# Patient Record
Sex: Female | Born: 1975
Health system: Southern US, Community
[De-identification: ages and names within clinical notes are randomized; demographics above are authoritative.]

## PROBLEM LIST (undated history)

## (undated) ENCOUNTER — Inpatient Hospital Stay (HOSPITAL_COMMUNITY): Payer: Self-pay

## (undated) DIAGNOSIS — E119 Type 2 diabetes mellitus without complications: Secondary | ICD-10-CM

## (undated) DIAGNOSIS — D219 Benign neoplasm of connective and other soft tissue, unspecified: Secondary | ICD-10-CM

## (undated) DIAGNOSIS — D649 Anemia, unspecified: Secondary | ICD-10-CM

## (undated) HISTORY — PX: ABDOMINAL HYSTERECTOMY: SHX81

## (undated) HISTORY — PX: OVARIAN CYST REMOVAL: SHX89

## (undated) HISTORY — PX: OTHER SURGICAL HISTORY: SHX169

---

## 2000-09-07 ENCOUNTER — Inpatient Hospital Stay (HOSPITAL_COMMUNITY): Admission: AD | Admit: 2000-09-07 | Discharge: 2000-09-07 | Payer: Self-pay | Admitting: Obstetrics

## 2000-09-09 ENCOUNTER — Inpatient Hospital Stay (HOSPITAL_COMMUNITY): Admission: AD | Admit: 2000-09-09 | Discharge: 2000-09-09 | Payer: Self-pay | Admitting: Obstetrics

## 2000-09-16 ENCOUNTER — Inpatient Hospital Stay (HOSPITAL_COMMUNITY): Admission: AD | Admit: 2000-09-16 | Discharge: 2000-09-16 | Payer: Self-pay | Admitting: Obstetrics

## 2000-09-23 ENCOUNTER — Inpatient Hospital Stay (HOSPITAL_COMMUNITY): Admission: AD | Admit: 2000-09-23 | Discharge: 2000-09-23 | Payer: Self-pay | Admitting: Obstetrics

## 2001-02-13 ENCOUNTER — Inpatient Hospital Stay (HOSPITAL_COMMUNITY): Admission: AD | Admit: 2001-02-13 | Discharge: 2001-02-13 | Payer: Self-pay | Admitting: Obstetrics

## 2002-07-19 ENCOUNTER — Ambulatory Visit (HOSPITAL_COMMUNITY): Admission: RE | Admit: 2002-07-19 | Discharge: 2002-07-19 | Payer: Self-pay | Admitting: *Deleted

## 2002-08-19 ENCOUNTER — Ambulatory Visit (HOSPITAL_COMMUNITY): Admission: RE | Admit: 2002-08-19 | Discharge: 2002-08-19 | Payer: Self-pay | Admitting: *Deleted

## 2003-01-10 ENCOUNTER — Inpatient Hospital Stay (HOSPITAL_COMMUNITY): Admission: AD | Admit: 2003-01-10 | Discharge: 2003-01-12 | Payer: Self-pay | Admitting: *Deleted

## 2005-02-05 ENCOUNTER — Ambulatory Visit (HOSPITAL_COMMUNITY): Admission: RE | Admit: 2005-02-05 | Discharge: 2005-02-05 | Payer: Self-pay | Admitting: *Deleted

## 2005-03-12 ENCOUNTER — Ambulatory Visit (HOSPITAL_COMMUNITY): Admission: RE | Admit: 2005-03-12 | Discharge: 2005-03-12 | Payer: Self-pay | Admitting: *Deleted

## 2005-05-26 ENCOUNTER — Ambulatory Visit: Payer: Self-pay | Admitting: Obstetrics & Gynecology

## 2005-05-31 ENCOUNTER — Inpatient Hospital Stay (HOSPITAL_COMMUNITY): Admission: AD | Admit: 2005-05-31 | Discharge: 2005-06-02 | Payer: Self-pay | Admitting: *Deleted

## 2005-05-31 ENCOUNTER — Ambulatory Visit: Payer: Self-pay | Admitting: Family Medicine

## 2006-01-01 ENCOUNTER — Ambulatory Visit (HOSPITAL_COMMUNITY): Admission: RE | Admit: 2006-01-01 | Discharge: 2006-01-01 | Payer: Self-pay | Admitting: *Deleted

## 2006-09-08 ENCOUNTER — Ambulatory Visit: Payer: Self-pay | Admitting: Family Medicine

## 2006-12-21 ENCOUNTER — Ambulatory Visit (HOSPITAL_COMMUNITY): Admission: RE | Admit: 2006-12-21 | Discharge: 2006-12-21 | Payer: Self-pay | Admitting: Obstetrics & Gynecology

## 2007-01-28 ENCOUNTER — Ambulatory Visit: Payer: Self-pay | Admitting: Family Medicine

## 2007-02-03 ENCOUNTER — Ambulatory Visit: Payer: Self-pay | Admitting: *Deleted

## 2007-02-18 ENCOUNTER — Ambulatory Visit: Payer: Self-pay | Admitting: Family Medicine

## 2007-02-20 IMAGING — US US OB COMP +14 WK
1 series · 17 of 28 positions shown · non-contrast
Comparison: none

CLINICAL DATA: Anatomic exam.  24 weeks 6 days estimated gestational age measuring 28 weeks.

[Series 1: us ob comp +14 wk · 17 of 105 slices shown]
[im 1/105]
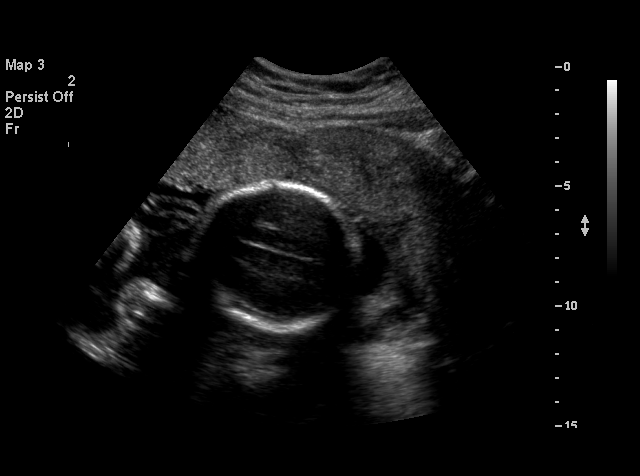
[im 8/105]
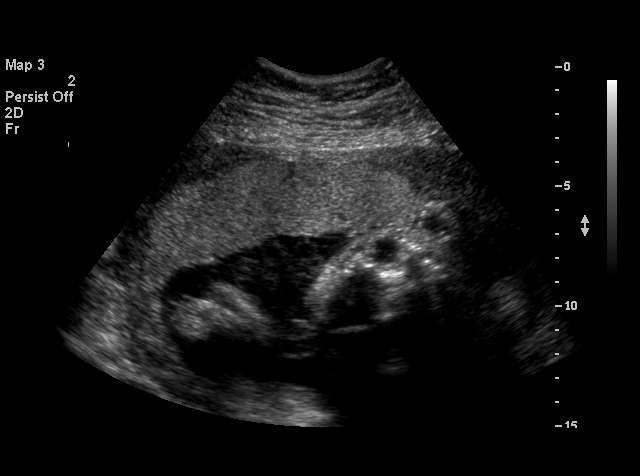
[im 16/105]
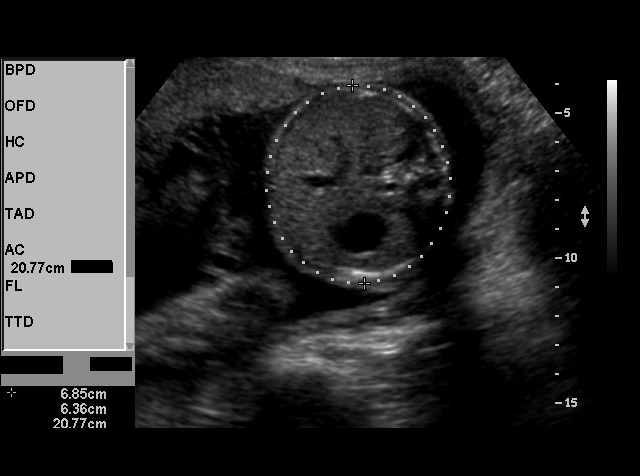
[im 20/105]
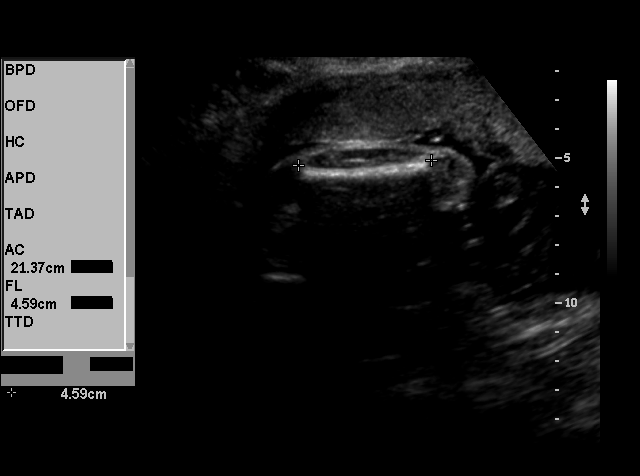
[im 27/105]
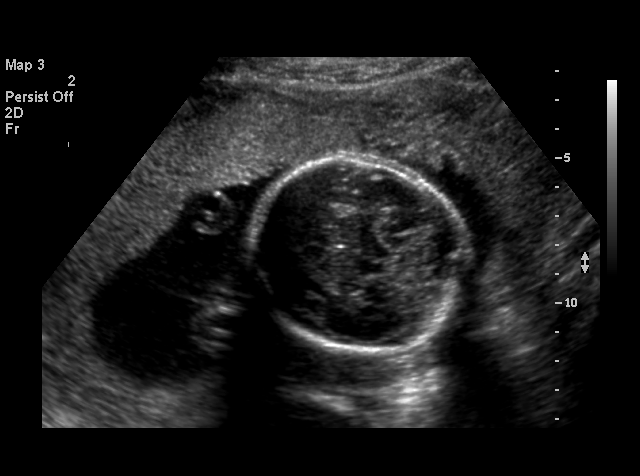
[im 35/105]
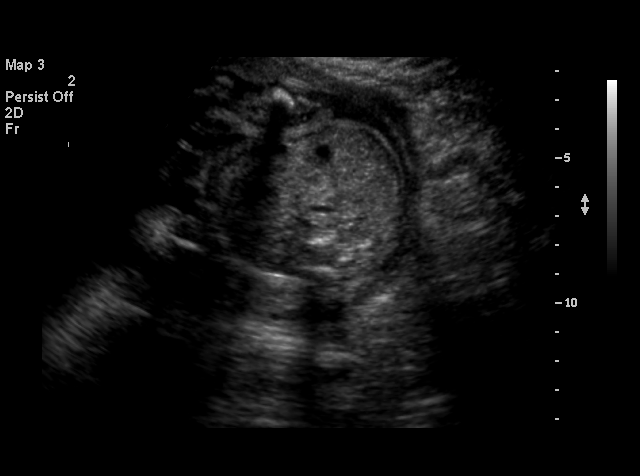
[im 39/105]
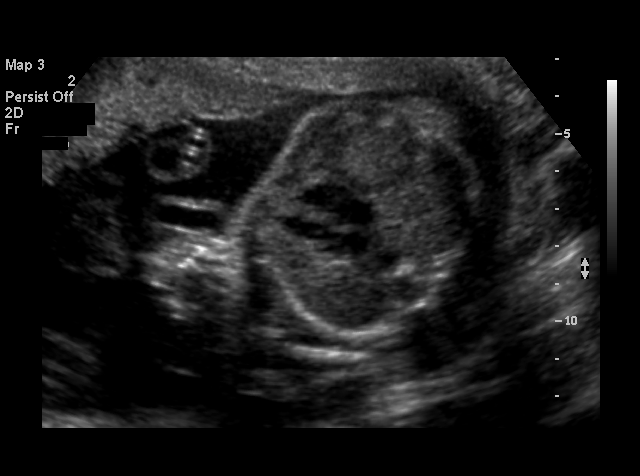
[im 47/105]
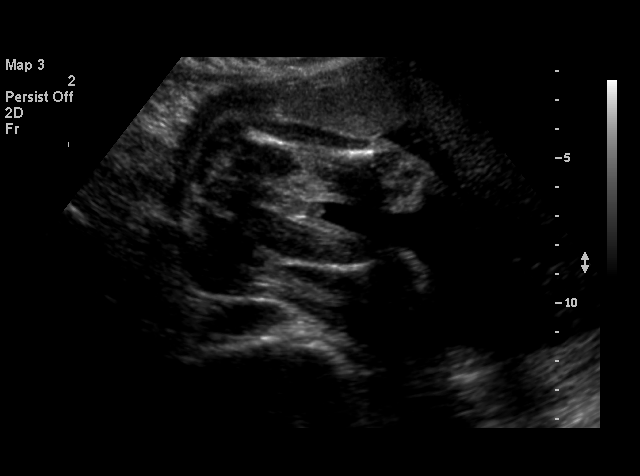
[im 54/105]
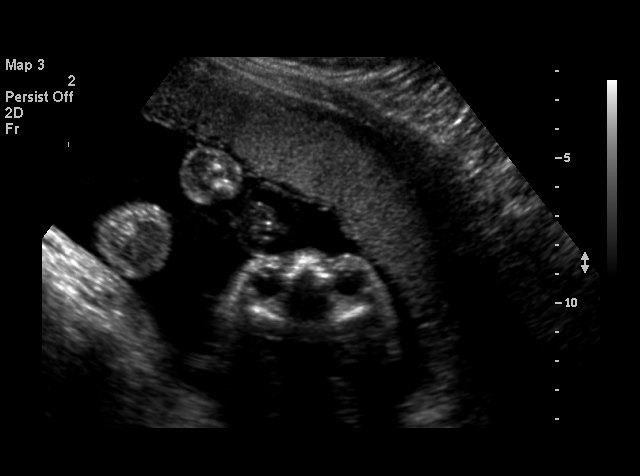
[im 58/105]
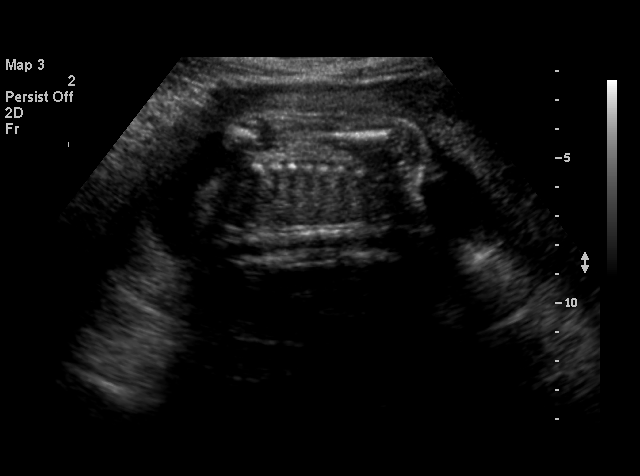
[im 66/105]
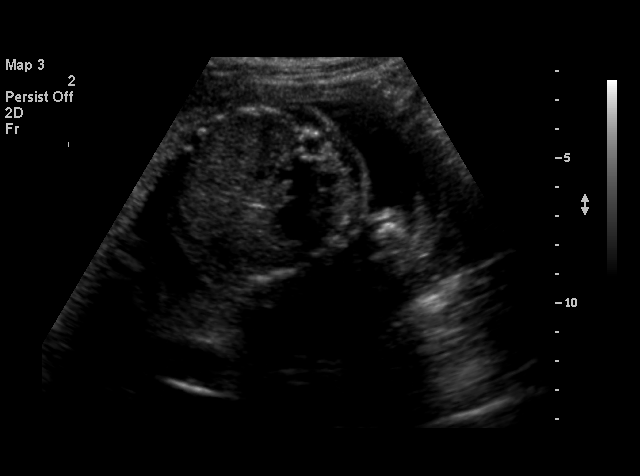
[im 70/105]
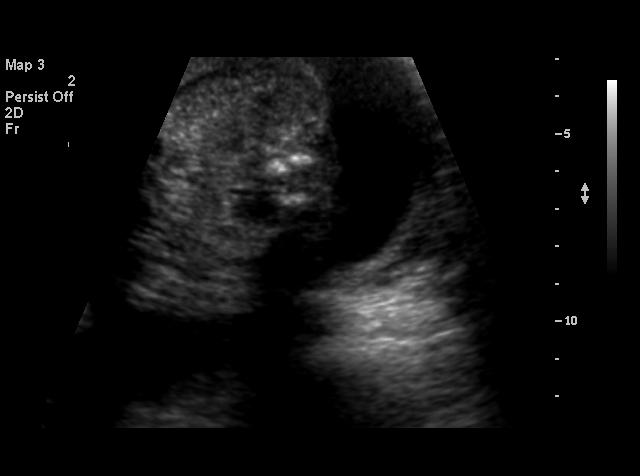
[im 78/105]
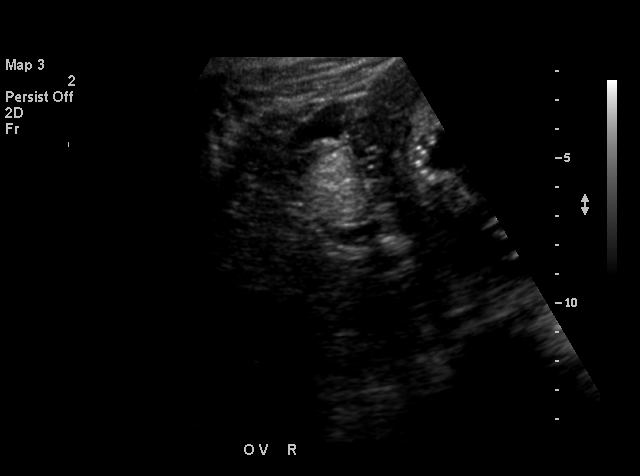
[im 85/105]
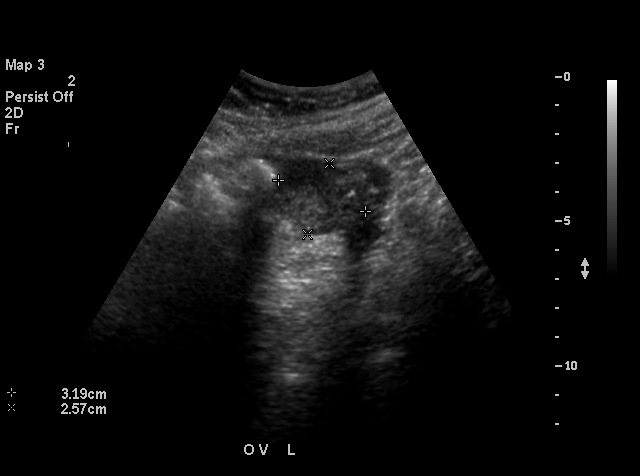
[im 89/105]
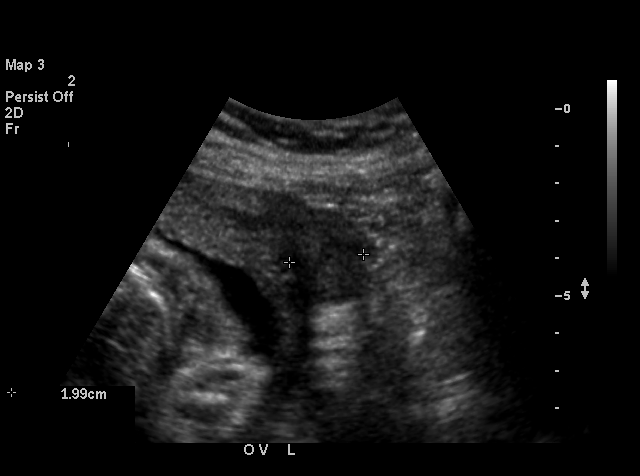
[im 97/105]
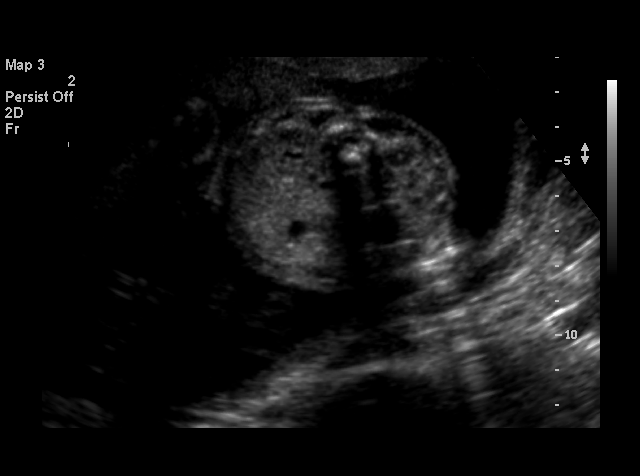
[im 105/105]
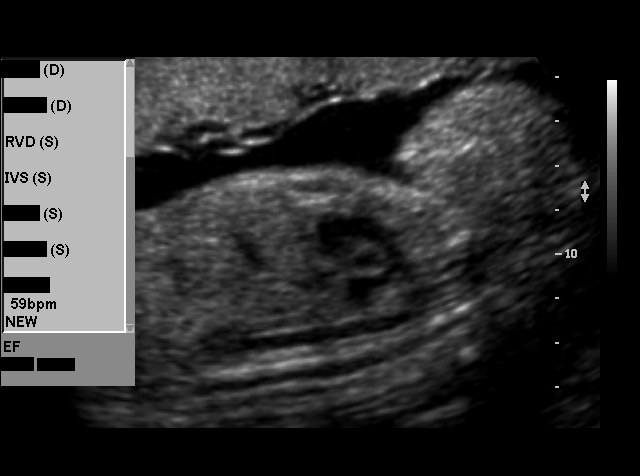

[17 of 28 positions shown; findings below may reference images not displayed]

OBSTETRICAL ULTRASOUND:
Number of Fetuses:  1
Heart Rate:  133
Movement:  Yes
Breathing:  No  
Presentation:  Cephalic
Placental Location:  Anterior
Grade:  I
Previa:  No
Amniotic Fluid (Subjective):  Normal
Amniotic Fluid (Objective):   5.8 cm Vertical pocket 

FETAL BIOMETRY
BPD:  6.2 cm   25 w 3 d
HC:  22.5 cm  24 w 5 d
AC:  21.3 cm   25 w 6 d
FL:  4.5 cm   25 w 1 d

MEAN GA:  25 w 2 d

FETAL ANATOMY
Lateral Ventricles:    Visualized 
Thalami/CSP:      Visualized 
Posterior Fossa:  Visualized 
Nuchal Region:    N/A
Spine:      Visualized 
4 Chamber Heart on Left:      Visualized 
Stomach on Left:      Visualized 
3 Vessel Cord:    Visualized 
Cord Insertion site:    Visualized 
Kidneys:  Visualized 
Bladder:  Visualized 
Extremities:      Visualized 

ADDITIONAL ANATOMY VISUALIZED:  LVOT, RVOT, upper lip, orbits, profile, diaphragm, heel, ductal arch, and male genitalia.

MATERNAL UTERINE AND ADNEXAL FINDINGS
Cervix:   4.2 cm Transabdominally
The right ovary contains an echogenic complex focus with a central cystic component measuring 1.9 x 3.6 x 2.7 cm and most compatible with a dermoid.  The left ovary is within normal limits.
IMPRESSION: 1.  Single intrauterine pregnancy demonstrating an estimated gestational age by ultrasound of 25 weeks and 2 days.  Correlation with assigned gestational age of 24 weeks and 6 days suggests appropriate growth.
2.  No focal fetal or placental abnormalities are noted with a good anatomic exam possible.  The aortic arch and 5th digit could not be seen due to positioning.  
3.  Noted is the presence of an echogenic complex cystic focus within the right ovary with measurements as above.  The appearance is most compatible with a dermoid tumor. This can be followed-up at a later point in gestation to assess for stability.
4.  Subjectively and quantitatively normal amniotic fluid volume and normal cervical length

</u12:p>

## 2007-06-23 ENCOUNTER — Encounter (INDEPENDENT_AMBULATORY_CARE_PROVIDER_SITE_OTHER): Payer: Self-pay | Admitting: *Deleted

## 2007-08-16 DIAGNOSIS — R05 Cough: Secondary | ICD-10-CM

## 2007-08-16 DIAGNOSIS — D179 Benign lipomatous neoplasm, unspecified: Secondary | ICD-10-CM | POA: Insufficient documentation

## 2007-08-16 DIAGNOSIS — G4700 Insomnia, unspecified: Secondary | ICD-10-CM

## 2007-08-16 DIAGNOSIS — R059 Cough, unspecified: Secondary | ICD-10-CM | POA: Insufficient documentation

## 2007-11-17 ENCOUNTER — Ambulatory Visit: Payer: Self-pay | Admitting: Family Medicine

## 2007-11-17 ENCOUNTER — Encounter (INDEPENDENT_AMBULATORY_CARE_PROVIDER_SITE_OTHER): Payer: Self-pay | Admitting: Internal Medicine

## 2007-11-17 LAB — CONVERTED CEMR LAB
AST: 24 units/L (ref 0–37)
Alkaline Phosphatase: 94 units/L (ref 39–117)
Basophils Absolute: 0 10*3/uL (ref 0.0–0.1)
Basophils Relative: 0 % (ref 0–1)
Cholesterol: 194 mg/dL (ref 0–200)
Eosinophils Absolute: 0.3 10*3/uL (ref 0.0–0.7)
Eosinophils Relative: 3 % (ref 0–5)
Glucose, Bld: 78 mg/dL (ref 70–99)
Hemoglobin: 12.7 g/dL (ref 12.0–15.0)
LDL Cholesterol: 81 mg/dL (ref 0–99)
Lymphocytes Relative: 35 % (ref 12–46)
MCHC: 31.8 g/dL (ref 30.0–36.0)
Monocytes Relative: 6 % (ref 3–12)
Platelets: 290 10*3/uL (ref 150–400)
RDW: 14.3 % (ref 11.5–15.5)
Total CHOL/HDL Ratio: 4.7
Triglycerides: 359 mg/dL — ABNORMAL HIGH (ref <150)

## 2007-11-24 ENCOUNTER — Ambulatory Visit (HOSPITAL_COMMUNITY): Admission: RE | Admit: 2007-11-24 | Discharge: 2007-11-24 | Payer: Self-pay | Admitting: Family Medicine

## 2007-12-01 ENCOUNTER — Ambulatory Visit: Payer: Self-pay | Admitting: Internal Medicine

## 2007-12-01 LAB — CONVERTED CEMR LAB
HDL: 48 mg/dL (ref >39)
Total CHOL/HDL Ratio: 3.4
Triglycerides: 158 mg/dL — ABNORMAL HIGH (ref <150)
VLDL: 32 mg/dL (ref 0–40)

## 2007-12-28 ENCOUNTER — Ambulatory Visit: Payer: Self-pay | Admitting: Internal Medicine

## 2008-02-10 ENCOUNTER — Ambulatory Visit: Payer: Self-pay | Admitting: Obstetrics and Gynecology

## 2008-02-21 ENCOUNTER — Ambulatory Visit: Payer: Self-pay | Admitting: Internal Medicine

## 2008-02-21 LAB — CONVERTED CEMR LAB: Lipase: 21 units/L (ref 0–75)

## 2008-08-01 ENCOUNTER — Encounter (INDEPENDENT_AMBULATORY_CARE_PROVIDER_SITE_OTHER): Payer: Self-pay | Admitting: Internal Medicine

## 2008-08-01 ENCOUNTER — Ambulatory Visit: Payer: Self-pay | Admitting: Internal Medicine

## 2008-08-01 LAB — CONVERTED CEMR LAB
Amphetamine Screen, Ur: NEGATIVE
Barbiturate Quant, Ur: NEGATIVE
Basophils Absolute: 0 10*3/uL (ref 0.0–0.1)
Benzodiazepines.: NEGATIVE
Cocaine Metabolites: NEGATIVE
Creatinine,U: 131.5 mg/dL
Eosinophils Absolute: 0.3 10*3/uL (ref 0.0–0.7)
Marijuana Metabolite: NEGATIVE
Monocytes Absolute: 0.6 10*3/uL (ref 0.1–1.0)
Neutro Abs: 5.3 10*3/uL (ref 1.7–7.7)
RBC: 4.69 M/uL (ref 3.87–5.11)
WBC: 8.4 10*3/uL (ref 4.0–10.5)

## 2008-08-02 ENCOUNTER — Encounter: Admission: RE | Admit: 2008-08-02 | Discharge: 2008-08-02 | Payer: Self-pay | Admitting: Internal Medicine

## 2008-08-10 ENCOUNTER — Ambulatory Visit (HOSPITAL_COMMUNITY): Admission: RE | Admit: 2008-08-10 | Discharge: 2008-08-10 | Payer: Self-pay | Admitting: Obstetrics and Gynecology

## 2008-08-17 ENCOUNTER — Ambulatory Visit: Payer: Self-pay | Admitting: Obstetrics and Gynecology

## 2008-08-22 ENCOUNTER — Ambulatory Visit (HOSPITAL_COMMUNITY): Admission: RE | Admit: 2008-08-22 | Discharge: 2008-08-22 | Payer: Self-pay | Admitting: Obstetrics & Gynecology

## 2008-09-15 ENCOUNTER — Ambulatory Visit: Payer: Self-pay | Admitting: Obstetrics & Gynecology

## 2008-11-07 ENCOUNTER — Ambulatory Visit: Payer: Self-pay | Admitting: Obstetrics & Gynecology

## 2008-11-07 ENCOUNTER — Inpatient Hospital Stay (HOSPITAL_COMMUNITY): Admission: RE | Admit: 2008-11-07 | Discharge: 2008-11-08 | Payer: Self-pay | Admitting: Obstetrics & Gynecology

## 2008-11-07 ENCOUNTER — Encounter: Payer: Self-pay | Admitting: Obstetrics & Gynecology

## 2008-11-24 ENCOUNTER — Encounter: Payer: Self-pay | Admitting: Obstetrics & Gynecology

## 2008-11-24 ENCOUNTER — Ambulatory Visit: Payer: Self-pay | Admitting: Obstetrics & Gynecology

## 2008-11-24 LAB — CONVERTED CEMR LAB
HCV Ab: NEGATIVE
Hepatitis B Surface Ag: NEGATIVE

## 2009-01-16 ENCOUNTER — Inpatient Hospital Stay (HOSPITAL_COMMUNITY): Admission: AD | Admit: 2009-01-16 | Discharge: 2009-01-16 | Payer: Self-pay | Admitting: Obstetrics & Gynecology

## 2009-01-17 ENCOUNTER — Ambulatory Visit: Payer: Self-pay | Admitting: Obstetrics and Gynecology

## 2009-01-17 LAB — CONVERTED CEMR LAB
Platelets: 281 10*3/uL (ref 150–400)
RBC: 3.77 M/uL — ABNORMAL LOW (ref 3.87–5.11)

## 2009-01-18 ENCOUNTER — Encounter: Payer: Self-pay | Admitting: Obstetrics & Gynecology

## 2009-01-18 ENCOUNTER — Ambulatory Visit (HOSPITAL_COMMUNITY): Admission: RE | Admit: 2009-01-18 | Discharge: 2009-01-18 | Payer: Self-pay | Admitting: Obstetrics & Gynecology

## 2009-01-18 ENCOUNTER — Ambulatory Visit: Payer: Self-pay | Admitting: Obstetrics & Gynecology

## 2009-02-07 ENCOUNTER — Ambulatory Visit: Payer: Self-pay | Admitting: Obstetrics and Gynecology

## 2009-03-20 ENCOUNTER — Ambulatory Visit: Payer: Self-pay | Admitting: Internal Medicine

## 2009-08-07 ENCOUNTER — Ambulatory Visit: Payer: Self-pay | Admitting: Internal Medicine

## 2009-08-08 ENCOUNTER — Encounter (INDEPENDENT_AMBULATORY_CARE_PROVIDER_SITE_OTHER): Payer: Self-pay | Admitting: Adult Health

## 2009-12-14 ENCOUNTER — Ambulatory Visit (HOSPITAL_COMMUNITY): Admission: RE | Admit: 2009-12-14 | Discharge: 2009-12-14 | Payer: Self-pay | Admitting: Family Medicine

## 2009-12-18 ENCOUNTER — Encounter: Payer: Self-pay | Admitting: Family Medicine

## 2009-12-18 ENCOUNTER — Ambulatory Visit (HOSPITAL_COMMUNITY): Admission: RE | Admit: 2009-12-18 | Discharge: 2009-12-18 | Payer: Self-pay | Admitting: Family Medicine

## 2010-01-02 ENCOUNTER — Ambulatory Visit (HOSPITAL_COMMUNITY): Admission: RE | Admit: 2010-01-02 | Discharge: 2010-01-02 | Payer: Self-pay | Admitting: Family Medicine

## 2010-01-15 ENCOUNTER — Ambulatory Visit (HOSPITAL_COMMUNITY): Admission: RE | Admit: 2010-01-15 | Discharge: 2010-01-15 | Payer: Self-pay | Admitting: Family Medicine

## 2010-01-15 ENCOUNTER — Encounter: Payer: Self-pay | Admitting: Family Medicine

## 2010-04-06 ENCOUNTER — Ambulatory Visit: Payer: Self-pay | Admitting: Family Medicine

## 2010-04-06 ENCOUNTER — Inpatient Hospital Stay (HOSPITAL_COMMUNITY): Admission: AD | Admit: 2010-04-06 | Discharge: 2010-04-07 | Payer: Self-pay | Admitting: Obstetrics & Gynecology

## 2010-04-21 ENCOUNTER — Inpatient Hospital Stay (HOSPITAL_COMMUNITY): Admission: AD | Admit: 2010-04-21 | Discharge: 2010-04-21 | Payer: Self-pay | Admitting: Obstetrics and Gynecology

## 2010-12-22 LAB — RPR: RPR Ser Ql: NONREACTIVE

## 2010-12-22 LAB — CBC
HCT: 39 % (ref 36.0–46.0)
Hemoglobin: 13.2 g/dL (ref 12.0–15.0)
MCH: 30.1 pg (ref 26.0–34.0)
MCHC: 33.9 g/dL (ref 30.0–36.0)
Platelets: 167 10*3/uL (ref 150–400)
RBC: 4.39 MIL/uL (ref 3.87–5.11)
WBC: 7.1 10*3/uL (ref 4.0–10.5)

## 2011-01-15 LAB — CBC
HCT: 35.1 % — ABNORMAL LOW (ref 36.0–46.0)
MCHC: 34.4 g/dL (ref 30.0–36.0)
MCV: 85.7 fL (ref 78.0–100.0)
MCV: 86.1 fL (ref 78.0–100.0)
Platelets: 297 10*3/uL (ref 150–400)
WBC: 6.4 10*3/uL (ref 4.0–10.5)

## 2011-01-15 LAB — SAMPLE TO BLOOD BANK

## 2011-01-21 LAB — CBC
HCT: 33.2 % — ABNORMAL LOW (ref 36.0–46.0)
Hemoglobin: 11.1 g/dL — ABNORMAL LOW (ref 12.0–15.0)
MCHC: 33.3 g/dL (ref 30.0–36.0)
MCHC: 33.3 g/dL (ref 30.0–36.0)
MCV: 85.9 fL (ref 78.0–100.0)
MCV: 87.2 fL (ref 78.0–100.0)
RBC: 4.5 MIL/uL (ref 3.87–5.11)
RDW: 14 % (ref 11.5–15.5)
RDW: 14.1 % (ref 11.5–15.5)

## 2011-02-18 NOTE — Op Note (Signed)
NAME:  Theresa Barry, Theresa Barry     ACCOUNT NO.:  [id number]   MEDICAL RECORD NO.:  [id number]          PATIENT TYPE:  INP   LOCATION:  9315                          [facility]   PHYSICIAN:  Norton Blizzard, MD    DATE OF BIRTH:  Oct 22, 1975   DATE OF PROCEDURE:  DATE OF DISCHARGE:                               OPERATIVE REPORT   PREOPERATIVE DIAGNOSIS:  Right ovarian dermoid cyst.   POSTOPERATIVE DIAGNOSES:  Right ovarian dermoid cyst, left ovarian  simple cyst.   PROCEDURE:  Diagnostic laparoscopy, minilaparotomy, right ovarian  cystectomy, left ovarian cyst aspiration.   SURGEON:  Norton Blizzard, MD   ASSISTANT:  Javier Glazier. Rose, MD   ANESTHESIA:  General.   IV FLUIDS:  2000 mL of lactated Ringer.   URINE OUTPUT:  375 mL.   ESTIMATED BLOOD LOSS:  100 mL.   INDICATIONS:  The patient is a 35 year old, gravida 3, para 2-0-1-2,  with persistent right ovarian dermoid cyst since 2006.  On the last  ultrasound in November 2009, the dermoid measured 5.5 cm.  She had CA-  125 which was normal at 13.6.  Given the persistent enlarging ovarian  dermoid cyst, the patient was counseled regarding the need for surgery  and risks of surgery including bleeding, infection, injury to  surrounding organs, need for additional procedures, possible laparotomy,  possible oophorectomy were discussed with the patient and written  informed consent was obtained using the help of a Spanish interpreter.   FINDINGS:  A 4-cm right ovarian dermoid cyst and a 6-cm left ovarian  simple cyst.  Normal uterus and rest of the pelvis.   SPECIMENS:  Right ovarian dermoid cyst.   DISPOSITION OF SPECIMENS:  To Pathology.   COMPLICATIONS:  None immediate.   PROCEDURE DETAILS:  The patient received preoperative IV antibiotics and  had sequential compression devices applied to her lower extremities  while in the preoperative area.  She was then taken to the operating  room where general anesthesia was  administered and found to be adequate.  The patient was then laid in a dorsal lithotomy position and prepped and  draped in a sterile manner.  Foley catheter was inserted into the  patient's bladder and attached to constant gravity, and an uterine  manipulator was also placed to the patient's cervix at this point.  Attention was turned to the patient's abdomen where an umbilical  incision was made, and the Veress needle was inserted into the patient's  abdomen.  Correct intraperitoneal placement was confirmed with a low  opening pressure of 5 mmHg upon insufflation with carbon dioxide gas.  The abdomen was then insufflated to 15 mmHg and 10-mm trocar was placed  in the umbilicus.  The laparoscope was placed through this trocar which  also confirmed correct intraperitoneal placement.  The pelvis was  surveyed, and at this point it was noted that there was large left  ovarian cyst and there was no way to tell if this was the dermoid cyst.  The right ovary looked significantly smaller than the left ovarian cyst,  and there were no external indications of a dermoid cyst in this ovary.  Given the uncertainty and also that there was not much exposure given  the large ovarian cyst on the left, the decision was made to proceed  with a minilaparotomy.  The abdomen was desufflated, and all instruments  were removed.  A  mini-Pfannenstiel incision was then made with the  scalpe, about 5-6 cm in length.  This incision was taken down to the  layer of fascia using electrocautery.  The fascia was incised in the  midline and this incision was extended bilaterally.  Kochers were  applied to the superior aspect of the fascial incision, and the  underlying rectus muscles were dissected off sharply and bluntly.  A  similar process was carried out on the inferior aspect.  The rectus  muscles were separated in the midline bluntly, and the peritoneum was  entered bluntly.  This peritoneal incision was extended  superiorly and  inferiorly with good visualization of the bowel and bladder.  Attention  was then turned to the pelvis where the left ovary was able to be  encountered and delivered up through the incision.  This ovary was  carefully examined and the large cyst was noted to be a simple cyst.  This left ovarian simple cyst was then aspirated for clear fluid, and  the incision that allowed for the aspiration was reapproximated using a  figure-of-eight stitch of 0 Vicryl.  Overall, good hemostasis was noted.  Attention was then turned to the patient's right ovary that was palpated  and the dermoid cyst was noted to be encompassing much of the middle of  the ovary.  The right ovary was delivered outside the incision on the  abdomen and was surrounded by multiple wet laparotomy sponges in the  event of spillage.  A longitudinal incision on the cortex of the ovary  was made to the outside layer of the cyst. Using hemostats and also  blunt methods, the cyst was carefully extricated from the surrounding  ovarian cortex and was able to be removed intact and no spillage  occurred.  The base of the cyst defect was then coagulated to maintain  hemostasis, and the ovarian cortex and the remaining ovary on the right  side were reapproximated using 2-0 Vicryl in a pursestring fashion  internally to reapproximate the ovary.  A mattress suture was used on  the top of the cortex for further reapproximation and hemostasis.  Overall, good hemostasis was noted.  This ovary was also wrapped with  Surgicel and then returned to the pelvis.  The rest of the pelvis was  surveyed and no abnormalities were seen.  The fascial layer was  reapproximated using 0 Vicryl in a running fashion.  The subcutaneous  layer was copiously irrigated and then reapproximated with 2-0 plain gut  given that it was about 4 cm in depth, and the skin was closed with 3-0  Vicryl subcuticular stitch.  Benzoin and Steri-Strips were also  placed  to reinforce this incision and laparoscopic  umbilical incision was  closed using the 0 Vicryl interrupted stitches on the fascial layer and  Dermabond for the skin incision.  The patient tolerated the procedure  well.  Sponge, instrument, and needle counts were correct x2.  She was  taken to the recovery room awake, extubated, and in stable condition.      Norton Blizzard, MD  Electronically Signed     UAD/MEDQ  D:  11/07/2008  T:  11/08/2008  Job:  [id number]

## 2011-02-18 NOTE — Group Therapy Note (Signed)
NAME:  Theresa Barry, Theresa Barry NO.:  [id number]   MEDICAL RECORD NO.:  [id number]          PATIENT TYPE:  WOC   LOCATION:  [facility]                   FACILITY:  [facility]   PHYSICIAN:  Argentina Donovan, MD        DATE OF BIRTH:  04/19/1976   DATE OF SERVICE:  02/10/2008                                  CLINIC NOTE   The patient is a Spanish-speaking Hispanic female gravida 3, para 2-0-1-  2 who was seen at [facility] with some pelvic pain.  She was sent for  ultrasound, which showed a small echogenic mass measuring 4.9 x 2.6 x  3.0 cm in the right ovary.  She had an  ultrasound in June 2006 which  showed the same mass at that time measuring 3.4 x 2.7 x 2.4 cm, thought  probably to be most likely a dermoid cyst.  The patient is asymptomatic  with this.  There was also seen a small leiomyomata measuring 1 x 0.9 x  1.3 centimeters.  She has intrauterine device, a Mirena, which she has  had for the past 3 years since her last child was delivered, and the  only significant worrisome factor here I think is that her mother had a  history of ovarian cancer.  She also had a brother with prostate cancer.  Since the patient is asymptomatic and masses are small at this point, I  thought we will go ahead and get a CA-125 and repeat the ultrasound  within 6 months.  She seems to agree with this plan and will have her  come back in 6 months for follow-up.  She had a Pap smear in February at  the [facility] and is getting a mammogram in the near future.   IMPRESSION:  Right ovarian cyst slightly increasing in size over the 3-  year period.  Mother has maternal history of breast cancer.   PLAN:  Follow-up in 6 months with another ultrasound.           ______________________________  Argentina Donovan, MD     PR/MEDQ  D:  02/10/2008  T:  02/10/2008  Job:  [id number]

## 2011-02-18 NOTE — Op Note (Signed)
NAME:  Theresa Barry, Theresa Barry     ACCOUNT NO.:  [id number]   MEDICAL RECORD NO.:  [id number]          PATIENT TYPE:  WOC   LOCATION:  WOC                          FACILITY:  [facility]   PHYSICIAN:  Lesly Dukes, M.D. 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   PROCEDURE:  Removal of intrauterine contraceptive device and vaginal  myomectomy.   SURGEON:  Lesly Dukes, MD   ANESTHESIA:  General.   FINDINGS:  Dislodged IUD and aborting myoma that was actively bleeding.   SPECIMENS:  Myoma to Pathology.   ESTIMATED BLOOD LOSS:  Minimal.   COMPLICATIONS:  None.   PROCEDURE:  After informed consent was obtained, the patient was taken  to operating room where general anesthesia was found to be adequate.  The patient was placed in dorsal lithotomy position and prepped and  draped in normal sterile fashion.  The bladder was emptied.  A lid  speculum was placed in the patient's vagina and retractors were placed  along the vaginal walls.  A body myoma was isolated.  Endoloops were  used to help aid in hemostasis.  The myoma was then twisted off its  stalk.  There was no bleeding after the procedure.  One of the Endoloop  did come off with the myoma.  There was also an IUD that was dislodged  and this was removed.  The patient is aware that she will need new birth  control.  The patient tolerated the procedure well.  Sponge, lap,  instrument, and needle count were correct x2 and the patient went to  recovery in stable condition.      Lesly Dukes, M.D.  Electronically Signed     KHL/MEDQ  D:  01/18/2009  T:  01/19/2009  Job:  [id number]

## 2011-02-18 NOTE — Group Therapy Note (Signed)
NAME:  Theresa Barry, Theresa Barry     ACCOUNT NO.:  [id number]   MEDICAL RECORD NO.:  [id number]          PATIENT TYPE:  WOC   LOCATION:  [facility]                   [facility]AC[facility]   PHYSICIAN:  Johnella Moloney, MD        DATE OF BIRTH:  Oct 06, 1976   DATE OF SERVICE:  11/24/2008                                  CLINIC NOTE   CHIEF COMPLAINT:  Followup of a right ovarian dermoid cyst and annual  exam.   HISTORY OF PRESENT ILLNESS:  This patient is a 35 year old gravida 3,  para 2-0-1-2, who is here for followup care of removal of a right  ovarian dermoid cyst as well as annual exam.  Her first imaging for this  cyst was done in June 2006, and the cyst measured 3.5 cm.  An ultrasound  was also done in November 2009, finding the cyst to be larger measuring  5.5 cm.  This cyst was removed as it was increasing in size.  Ms.  Greenfield has no other gynecological complaints at this time.  Interpreter  is used.   PAST MEDICAL HISTORY:  Right ovarian dermoid cyst.   PAST SURGICAL HISTORY:  Removal of right ovarian dermoid cyst on  November 07, 2008.   PAST OBSTETRIC/GYNECOLOGICAL HISTORY:  The patient had menarche at age  25.  She has normal menstrual periods.  No intermenstrual bleeding.  The  patient denies any sexually transmitted infections.  Her Pap smear is  done today.  STD screening was also performed.  Note that her last Pap  smear in February 2009 was normal, and her last mammogram was in October  2009, which was also normal.   OBSTETRICAL HISTORY:  She is a gravida 3, para 2-0-1-2 with 2 children  delivered vaginally.  No obstetric complications.   MEDICATIONS:  Multivitamins p.o. daily.   ALLERGIES:  No known drug, food, or latex allergies.   SOCIAL HISTORY:  This patient lives with her family, and denies any  tobacco, alcohol, or illicit drug use.   FAMILY HISTORY:  Remarkable for prominent history of breast cancer on  maternal and paternal sides.  A brother has  prostate cancer.  Her mother  has a history of ovarian cancer.  The patient had a CA-125 done, which  came back within normal limits at a level of 13.6.   PHYSICAL EXAMINATION:  VITAL SIGNS:  Temperature 98.0, pulse 79, blood  pressure 88/63, weight 173.6 pounds, height 5 feet 2 inches, and  respirations 20.  GENERAL:  This is a pleasant woman in no apparent distress.  LUNGS:  Clear to auscultation bilaterally.  HEART:  Regular rate and rhythm.  Good S1 and S2.  ABDOMEN: Soft, nontender, and nondistended.  The incision site is  healing well.  It is dry, intact with no sign of infection, no erythema.  Steri-Strips were removed today.  EXTREMITIES:  No cyanosis, clubbing, or edema.  BREASTS:  Symmetric and normal in size.  Smooth with normal breast  tissue.  No masses or lymphadenopathy.  No discharge.  PELVIC:  External genitalia normal.  Vaginal mucosa moist without  lesion.  Normal rugation.  Cervix, parous.  No lesions.  Pap  smear done  today.   ASSESSMENT AND PLAN:  The patient is a 35 year old gravida 3, para 2-0-1-  2, here for her postoperative examination and annual exam.  It was  explained to the patient that the pathology report showed the ovarian  cyst was a dermoid cyst.  The patient has no other complaints at this  time.  A Pap smear was performed with gonorrhea and Chlamydia results  pending.  Further STD testing will be done today.  Serum testing for  hepatitis B, hepatitis C, HIV, and syphilis.  The patient will be  notified with the results.  She will return to clinic in one year for  another annual exam or sooner if needed.     ______________________________  Johnella Moloney, MD    ______________________________  Johnella Moloney, MD    UD/MEDQ  D:  11/24/2008  T:  11/25/2008  Job:  [id number]

## 2011-02-18 NOTE — Group Therapy Note (Signed)
NAME:  Theresa Barry, Theresa Barry     ACCOUNT NO.:  [id number]   MEDICAL RECORD NO.:  [id number]          PATIENT TYPE:  WOC   LOCATION:  [facility]                   FACILITY:  [facility]   PHYSICIAN:  Argentina Donovan, MD        DATE OF BIRTH:  11-Jan-1976   DATE OF SERVICE:  01/17/2009                                  CLINIC NOTE   The patient is a 35 year old Spanish speaking Hispanic female who  underwent diagnostic laparoscopy and mini laparotomy for right ovarian  cystectomy for dermoid cyst in February of this year.  She was having no  problems at her follow-up visit reported on November 25, 2008 and went  into maternity admissions yesterday and said she had been bleeding since  the procedure.  Had an examination and CBC which revealed a hemoglobin  of 11+ and bleeding that continued.  She came to the GYN clinic and was  noted to have a 5 cm aborting fibroid.  I called Dr. Jearld Lesch in to  examine her and she will be doing the surgery and she is going to have  her scheduled for tomorrow.  History and physical has been done and  dictated on the hospital line.  The patient will be admitted for D and C  and vaginal aborting with myomectomy of aborting fibroid.           ______________________________  Argentina Donovan, MD     PR/MEDQ  D:  01/17/2009  T:  01/17/2009  Job:  [id number]

## 2011-02-18 NOTE — Discharge Summary (Signed)
Theresa Barry, Theresa Barry     ACCOUNT NO.:  [id number]   MEDICAL RECORD NO.:  [id number]         PATIENT TYPE:  WINP   LOCATION:                                [facility]   PHYSICIAN:  Norton Blizzard, MD    DATE OF BIRTH:  [birth year]   DATE OF ADMISSION:  11/07/2008  DATE OF DISCHARGE:                               DISCHARGE SUMMARY   ADMISSION DIAGNOSIS:  Bilateral ovarian cyst.   PROCEDURES:  1. Diagnostic laparoscopy.  2. Minilaparotomy.  3. Right ovarian cystectomy.  4. Left ovarian cyst aspiration.   PERTINENT STUDIES:  The patient had an admission hemoglobin of 12.9,  discharge hemoglobin 11.1   BRIEF HOSPITAL COURSE:  The patient is a 35 year old gravida 3, para 2-0-  1-2, who was followed in the Gynecologic Clinic for persistent right  ovarian dermoid cyst that was noted to enlarge in size since it was  first noted in 2006.  The patient was scheduled for a laparoscopic  removal of dermoid ovarian cyst.  However, during surgery this was not  possible given that she also had a contralateral large cyst and there  was inadequate visualization or exposure.  For further details of this  surgery, please refer to the separate dictated operative report.  The  patient underwent an uncomplicated minilaparotomy, right ovarian  cystectomy, and left ovarian cyst aspiration.  She had an uncomplicated  postoperative course.  By postoperative day #1, she was ambulating  without difficulty, voiding without difficulty, and tolerating oral  intake.  She was deemed stable for discharge to home.   DISCHARGE MEDICATIONS:  1. Percocet 5/325 mg 1-2 tablets p.o. q.6 h. p.r.n. pain.  2. Ibuprofen 600 mg p.o. q.6 h. p.r.n. pain.  3. Colace 100 mg p.o. b.i.d. p.r.n. constipation.   DISCHARGE INSTRUCTIONS:  The patient was given routine postoperative  discharge instructions including avoiding lifting of anything greater  than 15 pounds for the next month and no driving while on narcotic  pain  medications.  She was also instructed on signs to look out for including  incisional redness or drainage, abnormal bleeding, fevers, chills,  sweats, uncontrolled nausea or vomiting, or any other concerning  symptoms and was advised to come to the MAU here at [facility] for  evaluation for any postsurgical concerns.  Her postoperative appointment  has been scheduled in the GYN Clinic on November 24, 2008, at 11:00  a.m., and this information was communicated with the patient.  Of note,  the patient is primarily Spanish speaking and all these instructions  were given with the help of a Spanish interpreter.     Norton Blizzard, MD  Electronically Signed    UAD/MEDQ  D:  11/08/2008  T:  11/08/2008  Job:  [id number]

## 2011-02-18 NOTE — Group Therapy Note (Signed)
NAME:  Theresa Barry, Theresa Barry     ACCOUNT NO.:  [id number]   MEDICAL RECORD NO.:  [id number]          PATIENT TYPE:  WOC   LOCATION:  [facility]                   FACILITY:  [facility]   PHYSICIAN:  Sid Falcon, CNM  DATE OF BIRTH:  [birth year]   DATE OF SERVICE:                                  CLINIC NOTE   Patient is here for followup of the repeat pelvic ultrasound for a right  ovarian cyst.  Patient is a Spanish-speaking Hispanic female, gravida 3,  para 2-0-1-2.  She was seen at [facility] for pelvic pain.  She had an  ultrasound done in June, 2006, which showed a right ovarian mass  measuring about 3.4 cm, consistent with a dermoid cyst.  Patient had a  follow-up ultrasound done on November 24, 2007.  The right ovary finding  was measuring at approximately 4.9 cm.  Again, demonstrating possible  dermoid cyst.  In addition, on this ultrasound, there was an IUD seen.  Patient is using an IUD for a birth control method.  Patient received a  repeat ultrasound on August 10, 2008.  The right ovarian lesion is  still measuring, at largest size, approximately 5.5 cm with a complex  cystic lesion and hyperechoic cyst.  In addition, also showing a  hypoechoic mass in the endometrial canal, measuring about 2.9 cm,  possible polyp or fibroid.  Patient denies bleeding at this time and  also reports no pelvic pain.  The last Pap smear was in February, 2009  at a free Pap smear screening.  Patient reports a negative result.   In addition, the IUD that is in place is showing it as being displaced  posteriorly by the intrauterine mass; however, still it is within the  uterine area.   The CA-125 labs completed on May, 2009 was 13.6, within normal limits.   Recommendation for the patient to have a CT scan for followup of the  lesion within the uterus and also for the right ovarian mass, to confirm  that it is indeed a dermoid cyst.  Due to the family history of the  mother having ovarian  cancer, we will follow close, and patient will  have a follow-up visit in 3 weeks.      Sid Falcon, CNM     WM/MEDQ  D:  08/17/2008  T:  08/17/2008  Job:  [id number]

## 2011-02-18 NOTE — H&P (Signed)
NAME:  Theresa Barry, SCHUESSLER     ACCOUNT NO.:  [id number]   MEDICAL RECORD NO.:  [id number]          PATIENT TYPE:  AMB   LOCATION:                                [facility]   PHYSICIAN:  Phil D. Okey Dupre, M.D.     DATE OF BIRTH:  03/17/1976   DATE OF ADMISSION:  01/17/2009  DATE OF DISCHARGE:                              HISTORY & PHYSICAL   CHIEF COMPLAINT:  The patient underwent right ovarian cystectomy in  February 2010 by laparoscopy and has been bleeding ever since, came into  the MAU yesterday and was referred down to the GYN Clinic.  On  examination, she was seen to have a large 5-cm aborting fibroid with the  cervix dilated around it.  The patient is a 35 year old gravida 3, para  2-0-1-2 and has been in good health except for the bleeding from the  time of her surgery.   MEDICATIONS:  The patient only takes multivitamin daily.   ALLERGIES:  No known drug, food, or latex allergies.   SOCIAL HISTORY:  The patient lives with family and she does not drink,  smoke, or take illicit drugs.   FAMILY HISTORY:  Breast cancer on both sides of the family.  Brother  with prostate cancer and mother had ovarian cancer.   PHYSICAL EXAMINATION:  GENERAL:  A well-developed, slightly obese  Spanish-speaking Hispanic female in no acute distress.  VITAL SIGNS:  Blood pressure of 110/70, pulses 85 per minute.  The  patient weighs 177 pounds, 5 feet 2 inches tall.  HEENT:  Within normal limits.  NECK:  Supple.  Thyroid is symmetrical with no masses.  LUNGS:  Clear to auscultation and percussion.  HEART:  No murmur.  Normal sinus rhythm.  BREASTS:  Symmetrical with dominant masses.  ABDOMEN:  Soft, flat, nontender.  No masses or organomegaly.  Abdominal  wound well healed.  GENITALIA:  External genitalia is normal, but a large amount of blood  there in the vaginal vault.  A large aborting fibroid could be easily  seen with respect bivalve speculum, but it was difficult to see the  cervix around it on bimanual exam.  Cervix was easily palpated having  been dilated and a relatively thick stalk could be palpated above it.  EXTREMITIES:  No edema.  No varicosities.   IMPRESSION:  Aborting leiomyomata uteri with active bleeding.   PLAN:  Admission for excision.     Phil D. Okey Dupre, M.D.    PDR/MEDQ  D:  01/17/2009  T:  01/18/2009  Job:  [id number]

## 2011-02-18 NOTE — Group Therapy Note (Signed)
NAME:  Theresa Barry, Theresa Barry NO.:  [id number]   MEDICAL RECORD NO.:  [id number]          PATIENT TYPE:  WOC   LOCATION:  [facility]                   FACILITY:  [facility]   PHYSICIAN:  Argentina Donovan, MD        DATE OF BIRTH:  [birth year]   DATE OF SERVICE:                                  CLINIC NOTE   CHIEF COMPLAINT:  Followup after myomectomy on January 17, 2009.   HISTORY OF PRESENT ILLNESS:  This is a 35 year old Spanish-speaking  female who was seen in clinic on January 17, 2009 for vaginal bleeding.  She was found to have a 5 cm aborting fibroid.  She was taken to the  operating room by Dr. Penne Lash on January 18, 2009 where she underwent  vaginal myomectomy and removal of her intrauterine contraceptive device.  She is here today for postop followup.  She reports that her bleeding  lasted for approximately 1 week after the surgery.  She has not had any  more bleeding since then.  She does not have any abdominal pain or  pelvic pain, although she does report that yesterday her entire abdomen  felt bloated.  She is interested in having another IUD placed for  contraception.  In the meantime until she can get the IUD placed, she is  interested in using condoms.   PHYSICAL EXAMINATION:  GENERAL:  She is alert in no acute distress,  pleasant and cooperative with the exam.  ABDOMEN:  Soft, nontender and nondistended.  No masses.  No guarding.  No rebound.  GU:  Reveals normal external genitalia.  Vaginal mucosa is moist and  without any lesion.  There is normal rugation.  The cervix is parous  with no lesions.  Her uterus is nontender and normal.  There is not any  adnexal tenderness.   ASSESSMENT:  A 35 year old Hispanic female who is 3 weeks postop after a  vaginal myomectomy.   PLAN:  The patient has opted for reinsertion of her IUD.  She has  completed the Mirena scholarship program form today.  I instructed her  that our clinic will call her when she has been approved for  insertion  of her IUD.  Otherwise, she will follow up in clinic on as needed basis.     ______________________________  Sylvan Cheese, MD    ______________________________  Argentina Donovan, MD    MJ/MEDQ  D:  02/07/2009  T:  02/07/2009  Job:  [id number]

## 2011-02-18 NOTE — Group Therapy Note (Signed)
NAME:  Theresa Barry, Theresa Barry     ACCOUNT NO.:  [id number]   MEDICAL RECORD NO.:  [id number]          PATIENT TYPE:  WOC   LOCATION:  [facility]                   [facility]ILITY[facility]   PHYSICIAN:  Johnella Moloney, MD        DATE OF BIRTH:  November 04, 1975   DATE OF SERVICE:  11/24/2008                                  CLINIC NOTE   ADDENDUM   PHYSICAL EXAMINATION:  PELVIC:  On bimanual exam, uterus is firm and  smooth.  Normal in size.  No masses detected.     ______________________________  Johnella Moloney, MD    ______________________________  Johnella Moloney, MD    UD/MEDQ  D:  11/24/2008  T:  11/25/2008  Job:  [id number]

## 2011-02-18 NOTE — Group Therapy Note (Signed)
NAME:  Theresa Barry, Theresa Barry     ACCOUNT NO.:  [id number]   MEDICAL RECORD NO.:  [id number]          PATIENT TYPE:  WOC   LOCATION:  [facility]                   [facility]   PHYSICIAN:  Johnella Moloney, MD        DATE OF BIRTH:  02/12/1976   DATE OF SERVICE:  09/15/2008                                  CLINIC NOTE   CHIEF COMPLAINT:  Persistent right ovarian dermoid cyst.   HISTORY OF PRESENT ILLNESS:  The patient is a 35 year old gravida 3,  para 2-0-1-2 who is being followed for right ovarian dermoid cyst.  Her  first imaging for this problem was in June 2006, at which point the  right ovarian cyst measured 3.5 cm.  On a recent ultrasound done on  August 10, 2008, the ovarian dermoid is larger, measuring 5.5 cm.  Given that this ovarian mass is getting bigger and the patient has a  complex familial history of ovarian cancer and breast cancer, a CA-125  was done, which came back within normal limits at a level of 13.6.  The  patient desires removal of this ovarian mass.  She denies any symptoms  at this moment.   PAST MEDICAL HISTORY:  None.   PAST SURGICAL HISTORY:  None.   PAST OBSTETRIC/GYNECOLOGICAL HISTORY:  The patient had menarche at age  2.  She has normal menstrual periods.  No intermenstrual bleeding.  The  patient denies any sexually transmitted infections or abnormal Pap  smear; her last one was in February 2009, which was normal.  Her last  mammogram was in October 2009, which was also normal.   OBSTETRICAL HISTORY:  She is a gravida 3, para 2-0-1-2 with two children  delivered vaginally, no obstetric complications.   MEDICATIONS:  Multivitamins p.o. daily.   ALLERGIES:  No known drug allergies.  The patient is not allergic to  latex.   SOCIAL HISTORY:  The patient lives with her family.  She denies any  tobacco, alcohol, or illicit drug use.   FAMILIAL HISTORY:  Remarkable for prominent history of breast cancer on  maternal and paternal sides.  She  also has a brother who has prostate  cancer and a mother with a history of ovarian cancer.  The patient said  that they had not had any genetic counseling or genetic testing due to  financial prohibitions.   PHYSICAL EXAMINATION:  VITAL SIGNS:  Temperature 97.8, pulse 56,  respirations 16, blood pressure 88/58, weight 169.8 pounds, height 5  foot 2 inches.  GENERAL:  No apparent distress.  LUNGS:  Clear to auscultation bilaterally.  HEART:  Regular rate and rhythm.  ABDOMEN:  Soft, nontender, and nondistended.  EXTREMITIES:  No cyanosis, clubbing, or edema.  PELVIC:  Deferred.   CT scan done on August 22, 2008, shows mass containing substantial  amount of internal fat involving her right ovary which measures 5.2 x  2.7 cm, consistent with right ovarian dermoid.  She also has a 2.7 cm  lesion in the lower uterine segment which represents submucosal fibroid  or endometrial polyp.  No free fluid.  There is a simple ovarian cyst on  the left ovary.  No pelvic  adenopathy or inflammatory process, no  evidence of dilated bowel loops.  She does have an IUD, which was seen  in place, in the uterus.   ASSESSMENT AND PLAN:  The patient is a 35 year old gravida 3, para 2-0-1-  2 here for her preoperative examination and evaluation.  The patient was  told that her dermoid cyst will be removed through a laparoscopic  method; given the size of this lesion, it might be more prudent to do  right ovarian removal, or oophorectomy.  The patient does agree with  this plan.  The risks of surgery including bleeding, infection, injury  to surrounding organs, need for additional procedures were explained to  the patient.  All questions regarding surgery were answered.  The  patient will be contacted by the surgical scheduler and given the time  and date for her surgery.  She was told that if she has any further  problems, she could report to the emergency room or MAU for evaluation.            ______________________________  Johnella Moloney, MD     UD/MEDQ  D:  09/15/2008  T:  09/16/2008  Job:  [id number]

## 2011-04-04 ENCOUNTER — Other Ambulatory Visit: Payer: Self-pay | Admitting: Family Medicine

## 2011-05-15 ENCOUNTER — Emergency Department (HOSPITAL_COMMUNITY)
Admission: EM | Admit: 2011-05-15 | Discharge: 2011-05-15 | Disposition: A | Discharge status: Home / Self Care | Payer: Self-pay | Attending: Emergency Medicine | Admitting: Emergency Medicine

## 2011-05-15 DIAGNOSIS — J329 Chronic sinusitis, unspecified: Secondary | ICD-10-CM | POA: Insufficient documentation

## 2011-05-15 DIAGNOSIS — R51 Headache: Secondary | ICD-10-CM | POA: Insufficient documentation

## 2011-11-17 ENCOUNTER — Other Ambulatory Visit: Payer: Self-pay | Admitting: Family Medicine

## 2011-11-17 DIAGNOSIS — N644 Mastodynia: Secondary | ICD-10-CM

## 2011-11-28 ENCOUNTER — Ambulatory Visit
Admission: RE | Admit: 2011-11-28 | Discharge: 2011-11-28 | Disposition: A | Discharge status: Home / Self Care | Payer: Self-pay | Source: Ambulatory Visit | Attending: Family Medicine | Admitting: Family Medicine

## 2011-11-28 DIAGNOSIS — N644 Mastodynia: Secondary | ICD-10-CM

## 2011-11-28 NOTE — Progress Notes (Signed)
Interpreter Wyvonnia Dusky for mammogram test and result

## 2011-12-02 ENCOUNTER — Ambulatory Visit: Payer: Self-pay

## 2013-03-22 ENCOUNTER — Other Ambulatory Visit: Payer: Self-pay | Admitting: Obstetrics and Gynecology

## 2013-03-22 DIAGNOSIS — N6321 Unspecified lump in the left breast, upper outer quadrant: Secondary | ICD-10-CM

## 2013-04-05 ENCOUNTER — Ambulatory Visit
Admission: RE | Admit: 2013-04-05 | Discharge: 2013-04-05 | Disposition: A | Discharge status: Home / Self Care | Payer: No Typology Code available for payment source | Source: Ambulatory Visit | Attending: Obstetrics and Gynecology | Admitting: Obstetrics and Gynecology

## 2013-04-05 DIAGNOSIS — N6321 Unspecified lump in the left breast, upper outer quadrant: Secondary | ICD-10-CM

## 2013-05-15 ENCOUNTER — Observation Stay (HOSPITAL_COMMUNITY)
Admission: AD | Admit: 2013-05-15 | Discharge: 2013-05-16 | Disposition: A | Discharge status: Home / Self Care | Payer: Medicaid Other | Source: Ambulatory Visit | Attending: Obstetrics and Gynecology | Admitting: Obstetrics and Gynecology

## 2013-05-15 ENCOUNTER — Encounter (HOSPITAL_COMMUNITY): Payer: Self-pay | Admitting: Family

## 2013-05-15 ENCOUNTER — Inpatient Hospital Stay (HOSPITAL_COMMUNITY): Payer: Medicaid Other

## 2013-05-15 DIAGNOSIS — N938 Other specified abnormal uterine and vaginal bleeding: Principal | ICD-10-CM | POA: Insufficient documentation

## 2013-05-15 DIAGNOSIS — D259 Leiomyoma of uterus, unspecified: Secondary | ICD-10-CM | POA: Insufficient documentation

## 2013-05-15 DIAGNOSIS — N949 Unspecified condition associated with female genital organs and menstrual cycle: Secondary | ICD-10-CM | POA: Insufficient documentation

## 2013-05-15 DIAGNOSIS — IMO0001 Reserved for inherently not codable concepts without codable children: Secondary | ICD-10-CM | POA: Diagnosis present

## 2013-05-15 LAB — BASIC METABOLIC PANEL
GFR calc Af Amer: >90 mL/min (ref >90)
GFR calc non Af Amer: >90 mL/min (ref >90)
Potassium: 3.6 mEq/L (ref 3.5–5.1)
Sodium: 136 mEq/L (ref 135–145)

## 2013-05-15 LAB — CBC
MCH: 27.8 pg (ref 26.0–34.0)
MCV: 83.7 fL (ref 78.0–100.0)
Platelets: 271 10*3/uL (ref 150–400)
RBC: 4.53 MIL/uL (ref 3.87–5.11)

## 2013-05-15 LAB — URINE MICROSCOPIC-ADD ON

## 2013-05-15 LAB — URINALYSIS, ROUTINE W REFLEX MICROSCOPIC
Bilirubin Urine: NEGATIVE
Ketones, ur: NEGATIVE mg/dL
Protein, ur: NEGATIVE mg/dL
Specific Gravity, Urine: 1.025 (ref 1.005–1.030)
Urobilinogen, UA: 0.2 mg/dL (ref 0.0–1.0)

## 2013-05-15 MED ORDER — CEFAZOLIN SODIUM-DEXTROSE 2-3 GM-% IV SOLR
2.0000 g | INTRAVENOUS | Status: AC
  Administered 2013-05-16: 2 g via INTRAVENOUS
  Filled 2013-05-15: qty 50

## 2013-05-15 MED ORDER — HYDROMORPHONE HCL PF 1 MG/ML IJ SOLN: 0.2000 mg | INTRAMUSCULAR | Status: DC | PRN

## 2013-05-15 MED ORDER — PRENATAL MULTIVITAMIN CH: 1.0000 | ORAL_TABLET | Freq: Every day | ORAL | Status: DC

## 2013-05-15 MED ORDER — SODIUM CHLORIDE 0.9 % IV SOLN
INTRAVENOUS | Status: DC
  Administered 2013-05-16: 03:00:00 via INTRAVENOUS

## 2013-05-15 MED ORDER — SODIUM CHLORIDE 0.9 % IV SOLN
INTRAVENOUS | Status: DC
  Administered 2013-05-15 – 2013-05-16 (×2): via INTRAVENOUS

## 2013-05-15 MED ORDER — HYDROCODONE-ACETAMINOPHEN 5-325 MG PO TABS: 1.0000 | ORAL_TABLET | ORAL | Status: DC | PRN

## 2013-05-15 NOTE — MAU Note (Signed)
Pt to room 305.  Report to Seward Grater, [place]

## 2013-05-15 NOTE — MAU Note (Signed)
Pt stated she has been having vaginal bleeding x 1 month since 04/08/13. Changes pad around 4-5 times a day.Pt having cramping on and off as well. Pt stated she has IUD for 3 years. Has had irregular bleeding  But never like this. Pt stated that [facility]  [facility] for appointment and told her doctor she could not feel the strings. He could not feel them either and told her may be they moved. No further testing was ordered.

## 2013-05-15 NOTE — MAU Note (Addendum)
Patient presents to MAU with c/o intermittent vaginal bleeding since July 4, constant bleeding x 1 week. Reports IUD for contraception, however was [facility] that string could not be felt. This was before the bleeding began.  Reports constant lower abdominal cramping. Reports she has an appointment at [facility] Dept tomorrow at 1300 for this issue. Ibuprofen 1000 mg at 1600 today.

## 2013-05-15 NOTE — MAU Provider Note (Signed)
CC: Vaginal Bleeding    First Provider Initiated Contact with Patient 05/15/13 1938    Spanish interpreter present for visit.  HPI Theresa Barry is a 37 y.o. [id number]  who presents with 1 month history of intermittent bleeding and cramping. Bleeding today heavier than a period and using 3 pads/hr. She feels like her insides are tearing apart. Took ibuprofen 2 tabs at 1600 without relief. She has had an IUD in place for 3 years and prior to this episode has had only light spotting or amenorrhea. Seen at health department in May and was told IUD string was not felt.    Past Medical History  Diagnosis Date  . Medical history non-contributory     OB History   Grav Para Term Preterm Abortions TAB SAB Ect Mult Living   4 3   1  1   3      # Outc Date GA Lbr Len/2nd Wgt Sex Del Anes PTL Lv   1 PAR      SVD   Yes   2 PAR      SVD   Yes   3 PAR      SVD   Yes   4 SAB               Past Surgical History  Procedure Laterality Date  . Ovarian cyst removal      History   Social History  . Marital Status: Married    Spouse Name: N/A    Number of Children: N/A  . Years of Education: N/A   Occupational History  . Not on file.   Social History Main Topics  . Smoking status: Never Smoker   . Smokeless tobacco: Not on file  . Alcohol Use: No  . Drug Use: No  . Sexually Active: Yes    Birth Control/ Protection: None   Other Topics Concern  . Not on file   Social History Narrative  . No narrative on file    No current facility-administered medications on file prior to encounter.   No current outpatient prescriptions on file prior to encounter.    Not on File  ROS Pertinent items in HPI. Denies dysuria, hematuria, urgency or frequency of urination. Denies irritative  vaginal discharge.  PHYSICAL EXAM Filed Vitals:   05/15/13 1839  BP: 104/79  Pulse: 89  Temp: 97.9 F (36.6 C)  Resp: 18   General: Obese female in no acute distress Cardiovascular: Normal  rate Respiratory: Normal effort Abdomen: Soft, diffuse mild tenderness lower abdomen Back: No CVAT Extremities: No edema Neurologic: Alert and oriented  Pelvic by Victorino Dike Rasch: Speculum exam: NEFG; mod-large blood; cervix clean, strings not visualized Bimanual exam: cervix closed, no CMT; uterus NSSP; no adnexal tenderness or masses   LAB RESULTS Results for orders placed during the hospital encounter of 05/15/13 (from the past 24 hour(s))  URINALYSIS, ROUTINE W REFLEX MICROSCOPIC     Status: Abnormal   Collection Time    05/15/13  6:40 PM      Result Value Range   Color, Urine YELLOW  YELLOW   APPearance CLOUDY (*) CLEAR   Specific Gravity, Urine 1.025  1.005 - 1.030   pH 6.0  5.0 - 8.0   Glucose, UA NEGATIVE  NEGATIVE mg/dL   Hgb urine dipstick LARGE (*) NEGATIVE   Bilirubin Urine NEGATIVE  NEGATIVE   Ketones, ur NEGATIVE  NEGATIVE mg/dL   Protein, ur NEGATIVE  NEGATIVE mg/dL   Urobilinogen, UA 0.2  0.0 -  1.0 mg/dL   Nitrite NEGATIVE  NEGATIVE   Leukocytes, UA MODERATE (*) NEGATIVE  URINE MICROSCOPIC-ADD ON     Status: Abnormal   Collection Time    05/15/13  6:40 PM      Result Value Range   Squamous Epithelial / LPF FEW (*) RARE   WBC, UA 3-6  <3 WBC/hpf   RBC / HPF TOO NUMEROUS TO COUNT  <3 RBC/hpf   Bacteria, UA FEW (*) RARE  POCT PREGNANCY, URINE     Status: None   Collection Time    05/15/13  6:52 PM      Result Value Range   Preg Test, Ur NEGATIVE  NEGATIVE    IMAGING US Transvaginal Non-ob  05/15/2013   *RADIOLOGY REPORT*  Clinical Data: Vaginal bleeding, IUD placement, string not localized  TRANSABDOMINAL AND TRANSVAGINAL ULTRASOUND OF PELVIS  Technique:  Both transabdominal and transvaginal ultrasound examinations of the pelvis were performed.  Transabdominal technique was performed for global imaging of the pelvis including uterus, ovaries, adnexal regions, and pelvic cul-de-sac.  It was necessary to proceed with endovaginal exam following the  transabdominal exam to visualize the endometrium and ovaries.  Comparison:  01/15/2010  Findings: Uterus:  Anteverted, anteflexed.  13.1 x 6.4 x 5.5 cm.  Lower uterine segment mass re-identified, more heterogeneous than previously, anatomic orientation not well delineated.  This measures 5.9 x 6.8 x 5.2 cm.  Endometrium: No IUD visualized.  Endometrial thickness 4 mm, no focal abnormality.  Right ovary: 3.1 x 1.9 x 1.5 cm.  Normal.  Left ovary: 4.5 x 4.3 x 2.6 cm.  Normal.  Other Findings:  No free fluid  IMPRESSION: IUD not visualized.  If there is concern for perforation, consider abdominal radiograph for further localization.  Interval increase in size of lower uterine segment mass which could represent a fibroid but this is not further well evaluated.  Polyp or hyperplasia /neoplasia could have a similar appearance. Consider pelvic MRI with contrast for further evaluation.   Original Report Authenticated By: Christiana Pellant, M.D.   US Pelvis Complete  05/15/2013   *RADIOLOGY REPORT*  Clinical Data: Vaginal bleeding, IUD placement, string not localized  TRANSABDOMINAL AND TRANSVAGINAL ULTRASOUND OF PELVIS  Technique:  Both transabdominal and transvaginal ultrasound examinations of the pelvis were performed.  Transabdominal technique was performed for global imaging of the pelvis including uterus, ovaries, adnexal regions, and pelvic cul-de-sac.  It was necessary to proceed with endovaginal exam following the transabdominal exam to visualize the endometrium and ovaries.  Comparison:  01/15/2010  Findings: Uterus:  Anteverted, anteflexed.  13.1 x 6.4 x 5.5 cm.  Lower uterine segment mass re-identified, more heterogeneous than previously, anatomic orientation not well delineated.  This measures 5.9 x 6.8 x 5.2 cm.  Endometrium: No IUD visualized.  Endometrial thickness 4 mm, no focal abnormality.  Right ovary: 3.1 x 1.9 x 1.5 cm.  Normal.  Left ovary: 4.5 x 4.3 x 2.6 cm.  Normal.  Other Findings:  No free fluid   IMPRESSION: IUD not visualized.  If there is concern for perforation, consider abdominal radiograph for further localization.  Interval increase in size of lower uterine segment mass which could represent a fibroid but this is not further well evaluated.  Polyp or hyperplasia /neoplasia could have a similar appearance. Consider pelvic MRI with contrast for further evaluation.   Original Report Authenticated By: Christiana Pellant, M.D.   .  MAU COURSE Urine culture sent Care assumed by Thressa Sheller CNM at 2000. [phone]: Dr.  Ferguson in to see the patient. Prolapsed fibroid seen, and IUD found in vaginal vault. Will admit for surgical excision of the fibroid.   ASSESSMENT  Fibroid  PLAN Admit  Plan to go to OR CBC Type and Screen      Danae Orleans, CNM 05/15/2013 7:41 PM

## 2013-05-16 ENCOUNTER — Encounter (HOSPITAL_COMMUNITY): Payer: Self-pay | Admitting: Obstetrics & Gynecology

## 2013-05-16 ENCOUNTER — Observation Stay (HOSPITAL_COMMUNITY): Payer: Medicaid Other | Admitting: Anesthesiology

## 2013-05-16 ENCOUNTER — Encounter (HOSPITAL_COMMUNITY)
Admission: AD | Disposition: A | Discharge status: Home / Self Care | Payer: Self-pay | Source: Ambulatory Visit | Attending: Obstetrics and Gynecology

## 2013-05-16 ENCOUNTER — Encounter (HOSPITAL_COMMUNITY): Payer: Self-pay | Admitting: Anesthesiology

## 2013-05-16 DIAGNOSIS — IMO0001 Reserved for inherently not codable concepts without codable children: Secondary | ICD-10-CM | POA: Diagnosis present

## 2013-05-16 DIAGNOSIS — N949 Unspecified condition associated with female genital organs and menstrual cycle: Secondary | ICD-10-CM

## 2013-05-16 DIAGNOSIS — D259 Leiomyoma of uterus, unspecified: Secondary | ICD-10-CM

## 2013-05-16 HISTORY — PX: HYSTEROSCOPY WITH D & C: SHX1775

## 2013-05-16 LAB — ABO/RH: ABO/RH(D): A POS

## 2013-05-16 SURGERY — DILATATION AND CURETTAGE /HYSTEROSCOPY
Anesthesia: General | Site: Uterus | Wound class: Clean Contaminated

## 2013-05-16 SURGERY — Surgical Case
Anesthesia: *Unknown

## 2013-05-16 MED ORDER — PROPOFOL 10 MG/ML IV EMUL
INTRAVENOUS | Status: AC
  Filled 2013-05-16: qty 20

## 2013-05-16 MED ORDER — MIDAZOLAM HCL 2 MG/2ML IJ SOLN
INTRAMUSCULAR | Status: AC
  Filled 2013-05-16: qty 2

## 2013-05-16 MED ORDER — GLYCINE 1.5 % IR SOLN
Status: DC | PRN
  Administered 2013-05-16: 3000 mL

## 2013-05-16 MED ORDER — ONDANSETRON HCL 4 MG/2ML IJ SOLN
INTRAMUSCULAR | Status: AC
  Filled 2013-05-16: qty 2

## 2013-05-16 MED ORDER — LACTATED RINGERS IV SOLN
INTRAVENOUS | Status: DC
  Administered 2013-05-16: 15:00:00 via INTRAVENOUS

## 2013-05-16 MED ORDER — MEPERIDINE HCL 25 MG/ML IJ SOLN: 6.2500 mg | INTRAMUSCULAR | Status: DC | PRN

## 2013-05-16 MED ORDER — HYDROCODONE-ACETAMINOPHEN 5-325 MG PO TABS: 1.0000 | ORAL_TABLET | ORAL | Status: DC | PRN

## 2013-05-16 MED ORDER — PROMETHAZINE HCL 25 MG/ML IJ SOLN: 6.2500 mg | INTRAMUSCULAR | Status: DC | PRN

## 2013-05-16 MED ORDER — FENTANYL CITRATE 0.05 MG/ML IJ SOLN: 25.0000 ug | INTRAMUSCULAR | Status: DC | PRN

## 2013-05-16 MED ORDER — KETOROLAC TROMETHAMINE 30 MG/ML IJ SOLN
INTRAMUSCULAR | Status: DC | PRN
  Administered 2013-05-16: 30 mg via INTRAVENOUS

## 2013-05-16 MED ORDER — ONDANSETRON HCL 4 MG/2ML IJ SOLN
INTRAMUSCULAR | Status: DC | PRN
  Administered 2013-05-16: 4 mg via INTRAVENOUS

## 2013-05-16 MED ORDER — FENTANYL CITRATE 0.05 MG/ML IJ SOLN
INTRAMUSCULAR | Status: DC | PRN
  Administered 2013-05-16 (×2): 50 ug via INTRAVENOUS

## 2013-05-16 MED ORDER — KETOROLAC TROMETHAMINE 30 MG/ML IJ SOLN: 15.0000 mg | Freq: Once | INTRAMUSCULAR | Status: DC | PRN

## 2013-05-16 MED ORDER — LACTATED RINGERS IV SOLN
INTRAVENOUS | Status: DC | PRN
  Administered 2013-05-16: 13:00:00 via INTRAVENOUS

## 2013-05-16 MED ORDER — DOCUSATE SODIUM 100 MG PO CAPS: 100.0000 mg | ORAL_CAPSULE | Freq: Two times a day (BID) | ORAL | Status: DC | PRN

## 2013-05-16 MED ORDER — IBUPROFEN 600 MG PO TABS: 600.0000 mg | ORAL_TABLET | Freq: Four times a day (QID) | ORAL | Status: DC | PRN

## 2013-05-16 MED ORDER — FENTANYL CITRATE 0.05 MG/ML IJ SOLN
INTRAMUSCULAR | Status: AC
  Filled 2013-05-16: qty 2

## 2013-05-16 MED ORDER — LIDOCAINE HCL (CARDIAC) 20 MG/ML IV SOLN
INTRAVENOUS | Status: DC | PRN
  Administered 2013-05-16: 50 mg via INTRAVENOUS

## 2013-05-16 MED ORDER — LIDOCAINE HCL (CARDIAC) 20 MG/ML IV SOLN
INTRAVENOUS | Status: AC
  Filled 2013-05-16: qty 5

## 2013-05-16 MED ORDER — PROPOFOL 10 MG/ML IV BOLUS
INTRAVENOUS | Status: DC | PRN
  Administered 2013-05-16: 200 mg via INTRAVENOUS

## 2013-05-16 MED ORDER — MIDAZOLAM HCL 5 MG/5ML IJ SOLN
INTRAMUSCULAR | Status: DC | PRN
  Administered 2013-05-16: 2 mg via INTRAVENOUS

## 2013-05-16 MED ORDER — MIDAZOLAM HCL 2 MG/2ML IJ SOLN: 0.5000 mg | Freq: Once | INTRAMUSCULAR | Status: DC | PRN

## 2013-05-16 MED ORDER — BUPIVACAINE HCL (PF) 0.5 % IJ SOLN
INTRAMUSCULAR | Status: AC
  Filled 2013-05-16: qty 30

## 2013-05-16 SURGICAL SUPPLY — 21 items
CANISTER SUCTION 2500CC (MISCELLANEOUS) ×2 IMPLANT
CATH ROBINSON RED A/P 16FR (CATHETERS) ×2 IMPLANT
CLOTH BEACON ORANGE TIMEOUT ST (SAFETY) ×2 IMPLANT
CONTAINER PREFILL 10% NBF 60ML (FORM) ×3 IMPLANT
DISSECTOR SPONGE CHERRY (GAUZE/BANDAGES/DRESSINGS) ×1 IMPLANT
DRAPE PROXIMA HALF (DRAPES) ×2 IMPLANT
DRESSING TELFA 8X3 (GAUZE/BANDAGES/DRESSINGS) ×2 IMPLANT
ELECT LOOP GYNE PRO 24FR (CUTTING LOOP)
ELECTRODE LOOP GYNE PRO 24FR (CUTTING LOOP) IMPLANT
GLOVE BIO SURGEON STRL SZ7 (GLOVE) ×2 IMPLANT
GOWN STRL REIN XL XLG (GOWN DISPOSABLE) ×4 IMPLANT
LOOP ANGLED CUTTING 22FR (CUTTING LOOP) IMPLANT
PACK HYSTEROSCOPY LF (CUSTOM PROCEDURE TRAY) ×2 IMPLANT
PAD OB MATERNITY 4.3X12.25 (PERSONAL CARE ITEMS) ×2 IMPLANT
PENCIL BUTTON HOLSTER BLD 10FT (ELECTRODE) ×1 IMPLANT
SUT VIC AB 0 CT1 27 (SUTURE) ×4
SUT VIC AB 0 CT1 27XBRD ANBCTR (SUTURE) IMPLANT
SUT VICRYL 0 ENDOLOOP (SUTURE) ×3 IMPLANT
TOWEL OR 17X24 6PK STRL BLUE (TOWEL DISPOSABLE) ×4 IMPLANT
WATER STERILE IRR 1000ML POUR (IV SOLUTION) ×2 IMPLANT
YANKAUER SUCT BULB TIP NO VENT (SUCTIONS) ×2 IMPLANT

## 2013-05-16 NOTE — Anesthesia Postprocedure Evaluation (Signed)
  Anesthesia Post-op Note  Anesthesia Post Note  Patient: Theresa Barry  Procedure(s) Performed: Procedure(s) (LRB): DILATATION AND CURETTAGE /HYSTEROSCOPY  Removal of /PROLAPSE FIBROID (N/A)  Anesthesia type: General  Patient location: PACU  Post pain: Pain level controlled  Post assessment: Post-op Vital signs reviewed  Last Vitals:  Filed Vitals:   05/16/13 1445  BP: 89/53  Pulse: 59  Temp:   Resp: 13    Post vital signs: Reviewed  Level of consciousness: sedated  Complications: No apparent anesthesia complications

## 2013-05-16 NOTE — Op Note (Signed)
  PREOPERATIVE DIAGNOSES:  Heavy vaginal bleeding secondary to prolapsing cervical fibroid POSTOPERATIVE DIAGNOSES: The same PROCEDURE: EXCISION OF PROLAPSED CERVICAL FIBROID,  HYSTEROSCOPY, DILATATION AND CURETTAGE SURGEON:  Dr. Jaynie Collins  INDICATIONS: 37 y.o. [id number]  here for scheduled surgery for the aforementioned diagnoses.  Risks of surgery were discussed with the patient including but not limited to: bleeding which may require transfusion; infection which may require antibiotics; injury to uterus or surrounding organs; intrauterine scarring which may impair future fertility; need for additional procedures including laparotomy or laparoscopy; and other postoperative/anesthesia complications. Written informed consent was obtained.    FINDINGS:  6 cm x 8 cm prolapsed cervical fibroid with broad stalk int he anterior part of the endocervical canal about 2 cm from the os.  13 week size uterus.  Normal endometrium, no submucosal lesions noted.  Normal ostia bilaterally.  ANESTHESIA:   General INTRAVENOUS FLUIDS:  1500 ml of LR FLUID DEFICITS:  80 ml of Glycine ESTIMATED BLOOD LOSS:  150 ml SPECIMENS: Excised cervical fibroid and endometrial curettings sent to pathology COMPLICATIONS:  None immediate.  PROCEDURE DETAILS:  The patient was then taken to the operating room where general anesthesia was administered and was found to be adequate.  After an adequate timeout was performed, she was placed in the dorsal lithotomy position and examined; then prepped and draped in the sterile manner.   Her bladder was catheterized for an unmeasured amount of clear, yellow urine. A speculum was then placed in the patient's vagina and the prolapsed fibroid was noted to occupy the entire upper vagina.  It was palpated and noted to have a broad stalk emanating from the anterior part of the endocervical canal.  A single tooth tenaculum was applied to the fibroid, and this was used to bring the fibroid down the  vaginal canal.  The stalk of the fibroid was ligated x 3 using 0 Vicryl a special operative 'lasso' device.  The fibroid was then excised, and there was good hemostasis. The diagnostic hysteroscope was then inserted into the endometrial cavity under direct visualization using glycine as a suspension medium.  The uterine cavity was carefully examined with the findings as noted above.  The stalk of the excised fibroid was noted to be hemostatic in the cervical canal.   After further careful visualization of the uterine cavity, the hysteroscope was removed under direct visualization.  A sharp curettage was then performed to obtain a small amount of endometrial curettings.  The vaginal speculum was removed after noting good hemostasis, the cervix was noted to be edematous and distended at the end of the procedure.  The patient tolerated the procedure well and was taken to the recovery area awake, extubated and in stable condition.  Jaynie Collins, MD, FACOG Attending Obstetrician & Gynecologist Faculty Practice, [facility] [place]

## 2013-05-16 NOTE — Anesthesia Preprocedure Evaluation (Signed)
Anesthesia Evaluation  Patient identified by MRN, date of birth, ID band Patient awake    Reviewed: Allergy & Precautions, H&P , Patient's Chart, lab work & pertinent test results, reviewed documented beta blocker date and time   History of Anesthesia Complications Negative for: history of anesthetic complications  Airway Mallampati: III TM Distance: >3 FB Neck ROM: full    Dental no notable dental hx.    Pulmonary neg pulmonary ROS,  breath sounds clear to auscultation  Pulmonary exam normal       Cardiovascular Exercise Tolerance: Good negative cardio ROS  Rhythm:regular Rate:Normal     Neuro/Psych negative neurological ROS  negative psych ROS   GI/Hepatic negative GI ROS, Neg liver ROS,   Endo/Other  negative endocrine ROS  Renal/GU negative Renal ROS     Musculoskeletal   Abdominal   Peds  Hematology negative hematology ROS (+)   Anesthesia Other Findings   Reproductive/Obstetrics negative OB ROS                           Anesthesia Physical Anesthesia Plan  ASA: II  Anesthesia Plan: General LMA   Post-op Pain Management:    Induction:   Airway Management Planned:   Additional Equipment:   Intra-op Plan:   Post-operative Plan:   Informed Consent: I have reviewed the patients History and Physical, chart, labs and discussed the procedure including the risks, benefits and alternatives for the proposed anesthesia with the patient or authorized representative who has indicated his/her understanding and acceptance.   Dental Advisory Given  Plan Discussed with: CRNA and Surgeon  Anesthesia Plan Comments:         Anesthesia Quick Evaluation

## 2013-05-16 NOTE — Plan of Care (Signed)
Problem: Consults Goal: General Medical Patient Education See Patient Education Module for specific education. Outcome: Progressing Care plan discussed with patient with assist from Spanish interpreter.

## 2013-05-16 NOTE — Transfer of Care (Signed)
Immediate Anesthesia Transfer of Care Note  Patient: Theresa Barry  Procedure(s) Performed: Procedure(s): DILATATION AND CURETTAGE /HYSTEROSCOPY  Removal of /PROLAPSE FIBROID (N/A)  Patient Location: PACU  Anesthesia Type:General  Level of Consciousness: awake, alert  and oriented  Airway & Oxygen Therapy: Patient Spontanous Breathing and Patient connected to nasal cannula oxygen  Post-op Assessment: Report given to PACU RN and Post -op Vital signs reviewed and stable  Post vital signs: Reviewed and stable  Complications: No apparent anesthesia complications

## 2013-05-16 NOTE — H&P (Signed)
Gynecology H&P  CC: Vaginal Bleeding   HPI  Theresa Barry is a 37 y.o. [id number] who presents with 1 month history of intermittent bleeding and cramping. Bleeding today heavier than a period and using 3 pads/hr. She feels like her insides are tearing apart. Took ibuprofen 2 tabs at 1600 without relief. She has had an IUD in place for 3 years and prior to this episode has had only light spotting or amenorrhea. Seen at health department in May and was told IUD string was not felt.   Past Medical History   Diagnosis  Date   .  Medical history non-contributory     OB History    Grav  Para  Term  Preterm  Abortions  TAB  SAB  Ect  Mult  Living    4  3    1   1    3       #  Outc  Date  GA  Lbr Len/2nd  Wgt  Sex  Del  Anes  PTL  Lv    1  PAR       SVD    Yes    2  PAR       SVD    Yes    3  PAR       SVD    Yes    4  SAB               Past Surgical History   Procedure  Laterality  Date   .  Ovarian cyst removal      History    Social History   .  Marital Status:  Married     Spouse Name:  N/A     Number of Children:  N/A   .  Years of Education:  N/A    Occupational History   .  Not on file.    Social History Main Topics   .  Smoking status:  Never Smoker   .  Smokeless tobacco:  Not on file   .  Alcohol Use:  No   .  Drug Use:  No   .  Sexually Active:  Yes     Birth Control/ Protection:  None    Other Topics  Concern   .  Not on file    Social History Narrative   .  No narrative on file    No current facility-administered medications on file prior to encounter.    No current outpatient prescriptions on file prior to encounter.    ROS  Pertinent items in HPI. Denies dysuria, hematuria, urgency or frequency of urination. Denies irritative vaginal discharge.   PHYSICAL EXAM  Filed Vitals:    05/15/13 1839   BP:  104/79   Pulse:  89   Temp:  97.9 F (36.6 C)   Resp:  18   General: Obese female in no acute distress  Cardiovascular: Normal rate   Respiratory: Normal effort  Abdomen: Soft, diffuse mild tenderness lower abdomen  Back: No CVAT  Extremities: No edema  Neurologic: Alert and oriented  Pelvic by Victorino Dike Rasch:  Speculum exam: NEFG; mod-large blood; cervix clean, strings not visualized  Bimanual exam: cervix closed, no CMT; uterus NSSP; no adnexal tenderness or masses   LAB RESULTS  Results for orders placed during the hospital encounter of 05/15/13 (from the past 24 hour(s))   URINALYSIS, ROUTINE W REFLEX MICROSCOPIC Status: Abnormal    Collection Time    05/15/13 6:40 PM  Result  Value  Range    Color, Urine  YELLOW  YELLOW    APPearance  CLOUDY (*)  CLEAR    Specific Gravity, Urine  1.025  1.005 - 1.030    pH  6.0  5.0 - 8.0    Glucose, UA  NEGATIVE  NEGATIVE mg/dL    Hgb urine dipstick  LARGE (*)  NEGATIVE    Bilirubin Urine  NEGATIVE  NEGATIVE    Ketones, ur  NEGATIVE  NEGATIVE mg/dL    Protein, ur  NEGATIVE  NEGATIVE mg/dL    Urobilinogen, UA  0.2  0.0 - 1.0 mg/dL    Nitrite  NEGATIVE  NEGATIVE    Leukocytes, UA  MODERATE (*)  NEGATIVE   URINE MICROSCOPIC-ADD ON Status: Abnormal    Collection Time    05/15/13 6:40 PM   Result  Value  Range    Squamous Epithelial / LPF  FEW (*)  RARE    WBC, UA  3-6  <3 WBC/hpf    RBC / HPF  TOO NUMEROUS TO COUNT  <3 RBC/hpf    Bacteria, UA  FEW (*)  RARE   POCT PREGNANCY, URINE Status: None    Collection Time    05/15/13 6:52 PM   Result  Value  Range    Preg Test, Ur  NEGATIVE  NEGATIVE    IMAGING  05/15/2013  TRANSABDOMINAL AND TRANSVAGINAL ULTRASOUND OF PELVIS Clinical Data: Vaginal bleeding, IUD placement, string not localized  Technique: Both transabdominal and transvaginal ultrasound examinations of the pelvis were performed. Transabdominal technique was performed for global imaging of the pelvis including uterus, ovaries, adnexal regions, and pelvic cul-de-sac. It was necessary to proceed with endovaginal exam following the transabdominal exam to visualize  the endometrium and ovaries. Comparison: 01/15/2010 Findings: Uterus: Anteverted, anteflexed. 13.1 x 6.4 x 5.5 cm. Lower uterine segment mass re-identified, more heterogeneous than previously, anatomic orientation not well delineated. This measures 5.9 x 6.8 x 5.2 cm. Endometrium: No IUD visualized. Endometrial thickness 4 mm, no focal abnormality. Right ovary: 3.1 x 1.9 x 1.5 cm. Normal. Left ovary: 4.5 x 4.3 x 2.6 cm. Normal. Other Findings: No free fluid IMPRESSION: IUD not visualized. If there is concern for perforation, consider abdominal radiograph for further localization. Interval increase in size of lower uterine segment mass which could represent a fibroid but this is not further well evaluated. Polyp or hyperplasia /neoplasia could have a similar appearance. Consider pelvic MRI with contrast for further evaluation. Original Report Authenticated By: Christiana Pellant, M.D.    MAU COURSE  Urine culture sent  Care assumed by Thressa Sheller CNM at 2000.  2149: Dr. Emelda Fear in to see the patient. Prolapsed fibroid seen, and IUD found in vaginal vault. Will admit for surgical excision of the fibroid.   ASSESSMENT  Fibroid   PLAN  Admit  Plan to go to OR tomorrow morning CBC  Type and Screen   Danae Orleans, CNM  05/15/2013  7:41 PM  Attestation of Attending Supervision of Advanced Practitioner (PA/CNM/NP) 05/16/2013  11:55 AM Evaluation and management procedures were performed by the Advanced Practitioner under my supervision and collaboration.  I have reviewed the Advanced Practitioner's note and chart, and I agree with the management and plan.  Patient was seen and examined today before surgery with the help of a Spanish interpreter.  She is scheduled to undergo Hysteroscopy, D&C, excision of prolapsed fibroid.  Risks of procedure reviewed with patient, all questions answered.  Consent signed. To OR when  ready.   Jaynie Collins, MD, FACOG Attending Obstetrician & Gynecologist Faculty  Practice, [facility] of [place]

## 2013-05-16 NOTE — Discharge Summary (Signed)
Gynecology Physician Discharge Summary  Patient ID: Theresa Barry MRN: [id number] DOB/AGE: 1976-09-18 37 y.o.  Admit date: 05/15/2013 Discharge date: 05/16/2013  Admission/Preoperative Diagnoses: Heavy vaginal bleeding secondary to prolapsing fibroid  Procedures:  EXCISION OF PROLAPSED CERVICAL FIBROID,  HYSTEROSCOPY, DILATATION AND CURETTAGE  Hospital Course:  Theresa Barry is a 37 y.o. [id number] admitted for heavy vaginal bleeding, in the setting of a prolapsed cervical fibroid.  Her hemoglobin was stable, she had no signs of anemia.  She was scheduled for excision of the fibroid today.  She underwent the procedures as mentioned above, her operation was uncomplicated. For further details about surgery, please refer to the operative report. Patient had an uncomplicated postoperative course and was discharged to home six hours after her surgery.  She had minimal bleeding at the time of discharge.  Significant Diagnostic Studies:  Results for orders placed during the hospital encounter of 05/15/13 (from the past 48 hour(s))  URINALYSIS, ROUTINE W REFLEX MICROSCOPIC     Status: Abnormal   Collection Time    05/15/13  6:40 PM      Result Value Range   Color, Urine YELLOW  YELLOW   APPearance CLOUDY (*) CLEAR   Specific Gravity, Urine 1.025  1.005 - 1.030   pH 6.0  5.0 - 8.0   Glucose, UA NEGATIVE  NEGATIVE mg/dL   Hgb urine dipstick LARGE (*) NEGATIVE   Bilirubin Urine NEGATIVE  NEGATIVE   Ketones, ur NEGATIVE  NEGATIVE mg/dL   Protein, ur NEGATIVE  NEGATIVE mg/dL   Urobilinogen, UA 0.2  0.0 - 1.0 mg/dL   Nitrite NEGATIVE  NEGATIVE   Leukocytes, UA MODERATE (*) NEGATIVE  URINE MICROSCOPIC-ADD ON     Status: Abnormal   Collection Time    05/15/13  6:40 PM      Result Value Range   Squamous Epithelial / LPF FEW (*) RARE   WBC, UA 3-6  <3 WBC/hpf   RBC / HPF TOO NUMEROUS TO COUNT  <3 RBC/hpf   Bacteria, UA FEW (*) RARE  POCT PREGNANCY, URINE     Status: None   Collection Time    05/15/13  6:52 PM      Result Value Range   Preg Test, Ur NEGATIVE  NEGATIVE   Comment:            THE SENSITIVITY OF THIS     METHODOLOGY IS >24 mIU/mL  TYPE AND SCREEN     Status: None   Collection Time    05/15/13  9:52 PM      Result Value Range   ABO/RH(D) A POS     Antibody Screen NEG     Sample Expiration 05/18/2013    CBC     Status: None   Collection Time    05/15/13 10:29 PM      Result Value Range   WBC 9.6  4.0 - 10.5 K/uL   RBC 4.53  3.87 - 5.11 MIL/uL   Hemoglobin 12.6  12.0 - 15.0 g/dL   HCT 29.5  62.1 - 30.8 %   MCV 83.7  78.0 - 100.0 fL   MCH 27.8  26.0 - 34.0 pg   MCHC 33.2  30.0 - 36.0 g/dL   RDW 65.7  84.6 - 96.2 %   Platelets 271  150 - 400 K/uL  BASIC METABOLIC PANEL     Status: Abnormal   Collection Time    05/15/13 10:29 PM      Result Value Range   Sodium 136  135 - 145 mEq/L   Potassium 3.6  3.5 - 5.1 mEq/L   Chloride 102  96 - 112 mEq/L   CO2 22  19 - 32 mEq/L   Glucose, Bld 103 (*) 70 - 99 mg/dL   BUN 13  6 - 23 mg/dL   Creatinine, Ser 1.61  0.50 - 1.10 mg/dL   Calcium 9.3  8.4 - 09.6 mg/dL   GFR calc non Af Amer >90  >90 mL/min   GFR calc Af Amer >90  >90 mL/min   Comment:            The eGFR has been calculated     using the CKD EPI equation.     This calculation has not been     validated in all clinical     situations.     eGFR's persistently     <90 mL/min signify     possible Chronic Kidney Disease.  ABO/RH     Status: None   Collection Time    05/15/13 10:29 PM      Result Value Range   ABO/RH(D) A POS     Discharge Exam: Blood pressure 93/57, pulse 64, temperature 97.7 F (36.5 C), temperature source Oral, resp. rate 18, height 5\' 2"  (1.575 m), weight 174 lb 1.3 oz (78.962 kg), SpO2 100.00%. General appearance: alert and no distress Resp: clear to auscultation bilaterally Cardio: regular rate and rhythm GI: soft, non-tender; bowel sounds normal; no masses,  no organomegaly Pelvic: minimal vaginal  bleeding Extremities: extremities normal, atraumatic, no cyanosis or edema and Homans sign is negative, no sign of DVT  Discharged Condition: stable  Disposition: 01-Home or Self Care    Medication List         acetaminophen 500 MG tablet  Commonly known as:  TYLENOL  Take 1,000 mg by mouth every 6 (six) hours as needed for pain.     docusate sodium 100 MG capsule  Commonly known as:  COLACE  Take 1 capsule (100 mg total) by mouth 2 (two) times daily as needed for constipation.     HYDROcodone-acetaminophen 5-325 MG per tablet  Commonly known as:  NORCO/VICODIN  Take 1-2 tablets by mouth every 4 (four) hours as needed.     ibuprofen 600 MG tablet  Commonly known as:  ADVIL,MOTRIN  Take 1 tablet (600 mg total) by mouth every 6 (six) hours as needed for pain.           Follow-up Information   Follow up with [facility] In 4 weeks. (You will be called with appointment)    Contact information:   [address] [place] [address] [phone]      Signed:  Jaynie Collins, MD, FACOG Attending Obstetrician & Gynecologist Faculty Practice, [facility] of [place]

## 2013-05-17 ENCOUNTER — Encounter (HOSPITAL_COMMUNITY): Payer: Self-pay | Admitting: Obstetrics & Gynecology

## 2013-05-17 ENCOUNTER — Encounter: Payer: Self-pay | Admitting: Obstetrics & Gynecology

## 2013-05-17 LAB — URINE CULTURE: Colony Count: 80000

## 2013-05-20 NOTE — MAU Provider Note (Signed)
Attestation of Attending Supervision of Advanced Practitioner: Evaluation and management procedures were performed by the PA/NP/CNM/OB Fellow under my supervision/collaboration. Chart reviewed and agree with management and plan.  Keyia Moretto V 05/20/2013 5:35 AM

## 2013-05-23 ENCOUNTER — Telehealth: Payer: Self-pay

## 2013-05-23 NOTE — Telephone Encounter (Signed)
Called pt with Theresa Barry and informed pt of benign pathology.  Pt asked if she can do Zumba per Dr. Erin Fulling pt can do Zumba.  Pt stated "thank you".

## 2013-05-23 NOTE — Telephone Encounter (Signed)
Message copied by Faythe Casa on Mon May 23, 2013  4:46 PM ------      Message from: Jaynie Collins A      Created: Thu May 19, 2013  1:33 PM       Benign pathology.  Please call to inform patient of results. Needs Spanish interpreter. ------

## 2013-06-20 ENCOUNTER — Ambulatory Visit: Payer: Self-pay | Admitting: Obstetrics & Gynecology

## 2014-07-17 ENCOUNTER — Other Ambulatory Visit: Payer: Self-pay | Admitting: Primary Care

## 2014-07-17 DIAGNOSIS — Z1239 Encounter for other screening for malignant neoplasm of breast: Secondary | ICD-10-CM

## 2014-08-07 ENCOUNTER — Encounter (HOSPITAL_COMMUNITY): Payer: Self-pay | Admitting: Obstetrics & Gynecology

## 2014-08-10 ENCOUNTER — Encounter (INDEPENDENT_AMBULATORY_CARE_PROVIDER_SITE_OTHER): Payer: Self-pay

## 2014-08-10 ENCOUNTER — Ambulatory Visit
Admission: RE | Admit: 2014-08-10 | Discharge: 2014-08-10 | Disposition: A | Discharge status: Home / Self Care | Payer: No Typology Code available for payment source | Source: Ambulatory Visit | Attending: Primary Care | Admitting: Primary Care

## 2014-08-10 ENCOUNTER — Other Ambulatory Visit: Payer: Self-pay | Admitting: Primary Care

## 2014-08-10 DIAGNOSIS — Z1231 Encounter for screening mammogram for malignant neoplasm of breast: Secondary | ICD-10-CM

## 2015-02-16 ENCOUNTER — Telehealth: Payer: Self-pay | Admitting: *Deleted

## 2015-02-16 ENCOUNTER — Telehealth: Payer: Self-pay

## 2015-02-16 DIAGNOSIS — N938 Other specified abnormal uterine and vaginal bleeding: Secondary | ICD-10-CM

## 2015-02-16 NOTE — Telephone Encounter (Signed)
See other phone entry

## 2015-02-16 NOTE — Telephone Encounter (Signed)
Patient refferred from [facility]. Per Dr. Ihor Dow patient OK to have visit after U/S. Called patient with [facility]. Informed her of U/S appointment scheduled for Wednesday 5/18 at 0930-- advised she arrive 15 minutes early. INformed her she will be contacted with clinic appointment following U/S. Patient verbalized understanding and gratitude. No questions or concerns.

## 2015-02-21 ENCOUNTER — Ambulatory Visit (HOSPITAL_COMMUNITY)
Admission: RE | Admit: 2015-02-21 | Discharge: 2015-02-21 | Disposition: A | Discharge status: Home / Self Care | Payer: No Typology Code available for payment source | Source: Ambulatory Visit | Attending: Obstetrics & Gynecology | Admitting: Obstetrics & Gynecology

## 2015-02-21 DIAGNOSIS — N938 Other specified abnormal uterine and vaginal bleeding: Secondary | ICD-10-CM | POA: Insufficient documentation

## 2015-03-19 ENCOUNTER — Ambulatory Visit (INDEPENDENT_AMBULATORY_CARE_PROVIDER_SITE_OTHER): Payer: Self-pay | Admitting: Family Medicine

## 2015-03-19 ENCOUNTER — Other Ambulatory Visit (HOSPITAL_COMMUNITY)
Admission: RE | Admit: 2015-03-19 | Discharge: 2015-03-19 | Disposition: A | Discharge status: Home / Self Care | Payer: No Typology Code available for payment source | Source: Ambulatory Visit | Attending: Family Medicine | Admitting: Family Medicine

## 2015-03-19 ENCOUNTER — Encounter: Payer: Self-pay | Admitting: Family Medicine

## 2015-03-19 VITALS — BP 105/73 | HR 83 | Temp 98.7°F | Wt 176.2 lb

## 2015-03-19 DIAGNOSIS — Z30432 Encounter for removal of intrauterine contraceptive device: Secondary | ICD-10-CM

## 2015-03-19 DIAGNOSIS — N858 Other specified noninflammatory disorders of uterus: Secondary | ICD-10-CM | POA: Insufficient documentation

## 2015-03-19 DIAGNOSIS — R1909 Other intra-abdominal and pelvic swelling, mass and lump: Secondary | ICD-10-CM

## 2015-03-19 DIAGNOSIS — Z3202 Encounter for pregnancy test, result negative: Secondary | ICD-10-CM

## 2015-03-19 DIAGNOSIS — N888 Other specified noninflammatory disorders of cervix uteri: Secondary | ICD-10-CM

## 2015-03-19 DIAGNOSIS — Z01812 Encounter for preprocedural laboratory examination: Secondary | ICD-10-CM

## 2015-03-19 LAB — POCT PREGNANCY, URINE: Preg Test, Ur: NEGATIVE

## 2015-03-19 NOTE — Progress Notes (Signed)
   Subjective:    Patient ID: Theresa Barry, female    DOB: 03/05/1976, 39 y.o.   MRN: [id number]  HPI Patient referred for heavy bleeding that started 7 months ago.  Menses durations: 7-8 days with 3-4 days of heavy bleeding.  About 1 month between periods.  Occasional spotting between menses.  Has cramping with menses and sometimes throughout month.  Has paragard IUD about 2 years ago.  Mother had ovarian cancer.    US done prior to visit shows:  IMPRESSION: Mass-like thickening within the lower uterine segment, measuring up to 5.9 cm, with associated hypervascularity.  Differential considerations include endometrial hyperplasia/polyp, an intracavitary degenerating fibroid, or possibly endometrial carcinoma.  IUD in satisfactory position.  Review of Systems  Constitutional: Negative for fever and chills.  Gastrointestinal: Negative for nausea, vomiting, abdominal pain and diarrhea.  Genitourinary: Negative for dysuria, hematuria, vaginal discharge and vaginal pain.  All other systems reviewed and are negative.  I have reviewed the patients past medical, family, and social history.  I have reviewed the patient's medication list and allergies.,    Objective:   Physical Exam  Constitutional: She is oriented to person, place, and time. She appears well-developed and well-nourished.  HENT:  Head: Normocephalic and atraumatic.  Genitourinary: There is no tenderness, lesion or injury on the right labia. There is no tenderness, lesion or injury on the left labia. There is bleeding (pt on menses) in the vagina. No erythema or tenderness in the vagina. No foreign body around the vagina. No signs of injury around the vagina. No vaginal discharge found.    Neurological: She is alert and oriented to person, place, and time.      Assessment & Plan:   Problem List Items Addressed This Visit    None    Visit Diagnoses    Mass of cervix    -  Primary    Relevant Orders    Surgical pathology    Pre-procedure lab exam          Cervix cleaned with betadyne.  3 biopsies taken with colposcopy forceps.  Hemostasis achieved with monsels.   Will call pt with results.  IUD Removal  Patient was in the dorsal lithotomy position, normal external genitalia was noted.  A speculum was placed in the patient's vagina, normal discharge was noted, no lesions. The multiparous cervix was visualized, no lesions, no abnormal discharge,  and was swabbed with Betadine using scopettes.  The strings of the IUD was grasped and pulled using ring forceps.  The IUD was successfully removed in its entirety.  Patient tolerated the procedure well.

## 2015-03-20 ENCOUNTER — Encounter: Payer: Self-pay | Admitting: General Practice

## 2015-03-21 ENCOUNTER — Encounter: Payer: Self-pay | Admitting: *Deleted

## 2015-04-19 ENCOUNTER — Ambulatory Visit (INDEPENDENT_AMBULATORY_CARE_PROVIDER_SITE_OTHER): Payer: Self-pay | Admitting: Obstetrics & Gynecology

## 2015-04-19 ENCOUNTER — Other Ambulatory Visit: Payer: Self-pay | Admitting: General Practice

## 2015-04-19 VITALS — BP 112/73 | HR 83 | Temp 98.3°F | Ht 61.0 in | Wt 176.8 lb

## 2015-04-19 DIAGNOSIS — D260 Other benign neoplasm of cervix uteri: Secondary | ICD-10-CM

## 2015-04-19 DIAGNOSIS — N644 Mastodynia: Secondary | ICD-10-CM

## 2015-04-19 DIAGNOSIS — N920 Excessive and frequent menstruation with regular cycle: Secondary | ICD-10-CM

## 2015-04-19 DIAGNOSIS — D259 Leiomyoma of uterus, unspecified: Secondary | ICD-10-CM

## 2015-04-19 LAB — CBC
HCT: 32.7 % — ABNORMAL LOW (ref 36.0–46.0)
Hemoglobin: 10.4 g/dL — ABNORMAL LOW (ref 12.0–15.0)
MCH: 26.4 pg (ref 26.0–34.0)
MCHC: 31.8 g/dL (ref 30.0–36.0)
MCV: 83 fL (ref 78.0–100.0)
MPV: 9.8 fL (ref 8.6–12.4)
PLATELETS: 321 10*3/uL (ref 150–400)
RBC: 3.94 MIL/uL (ref 3.87–5.11)
RDW: 14.1 % (ref 11.5–15.5)
WBC: 8 10*3/uL (ref 4.0–10.5)

## 2015-04-19 MED ORDER — MEGESTROL ACETATE 40 MG PO TABS: 40.0000 mg | ORAL_TABLET | Freq: Two times a day (BID) | ORAL | Status: DC

## 2015-04-19 NOTE — Patient Instructions (Signed)
Informacin sobre Training and development officer (Hysterectomy Information)  La histerectoma es una ciruga que se realiza para extirpar el tero. Esta ciruga se puede realizar para tratar varios problemas mdicos. Despus de la ciruga, no volver a Personal assistant. Adems, debido a esta ciruga no podr quedar embarazada (estril). Asimismo, durante esta ciruga se podrn extirpar las trompas de falopio y los ovarios (salpingooforectoma bilateral).  RAZONES PARA REALIZAR UNA HISTERECTOMA  Sangrado persistente, anormal.  Dolor o infeccin persistente (crnico) en la pelvis.  El endometrio comienza a desarrollarse fuera del tero (endometriosis).  El endometrio comienza a desarrollarse en el msculo del tero (adenomiosis).  El tero desciende hacia la vagina (prolapso de los rganos de la pelvis).  Neoplasias benignas en el tero (fibromas uterinos) que provocan sntomas.  Clulas precancerosas.  Cncer cervical o cncer uterino. TIPOS DE HISTERECTOMA  Histerectoma supracervical: en este tipo de intervencin, se extirpa la parte superior del tero, pero no el cuello del tero.  Histerectoma total: se extirpan el tero y el cuello del tero.  Histerectoma radical: se extirpan el tero, el cuello del tero y los tejidos fibrosos que sostienen al tero en su lugar en la pelvis (parametrio). FORMAS EN QUE SE PUEDE REALIZAR UNA HISTERECTOMA  Histerectoma abdominal: se realiza un corte quirrgico grande (incisin) en el abdomen. Se extirpa el tero a travs de esta incisin.  Histerectoma vaginal: se realiza una incisin en la vagina. Se extirpa el tero a travs de esta incisin. No hay incisiones abdominales.  Histerectoma laparoscpica convencional: se realizan tres o cuatro incisiones pequeas en el abdomen. Se inserta un tubo delgado y luminoso con una cmara (laparoscopio) en una de las incisiones. A travs del resto de las incisiones se insertan otros instrumentos  quirrgicos. El tero se corta en trozos pequeos. Las piezas pequeas se eliminan a travs de las incisiones, o se retiran a travs de la vagina.  Histerectoma vaginal asistida por laparoscopia (HVAL): se realizan tres o cuatro incisiones pequeas en el abdomen. Parte de la ciruga se realiza por va laparoscpica y parte por va vaginal. El tero se extirpa a travs de la vagina.  Histerectoma laparoscpica asistida por robot: se insertan un laparoscopio y otros instrumentos quirrgicos en 3 o 4 incisiones pequeas en el abdomen. Se utiliza un dispositivo controlado por computadora para que el cirujano vea una imagen en 3D que lo ayuda a Chief Technology Officer los instrumentos quirrgicos. Esto permite movimientos ms precisos de los instrumentos quirrgicos. El tero se corta en trozos pequeos y se retira a travs de las incisiones o se elimina a travs de la vagina. RIESGOS Y Dunlap complicaciones asociadas con este procedimiento incluyen las siguientes:  Hemorragia y riesgos de Designer, industrial/product una transfusin sangunea. Infrmele al mdico si no quiere recibir ningn tipo de hemoderivados.  Cogulos de [employer] piernas o los pulmones.  Infeccin.  Lesin en los rganos circundantes.  Problemas o efectos secundarios relacionados con la anestesia.  Transformacin de cualquiera de las otras tcnicas en una histerectoma abdominal. QU ESPERAR DESPUES DE UNA HISTERECTOMA  Le darn medicamentos para el dolor.  Necesitar que alguien Nature conservation officer con usted durante los primeros 3 a 5 das despus de que regrese a Medical illustrator.  Tendr que ver al cirujano para un seguimiento 2 a 4 semanas despus de la ciruga para evaluar su progreso.  Posiblemente tenga sntomas tempranos de menopausia, como sofocos, sudoracin nocturna e insomnio.  Si le han realizado una histerectoma debido a un problema que no era cncer ni Ardelia Mems  afeccin que poda causar cncer, ya no necesitar una prueba de  Papanicolaou. Sin embargo, si ya no necesita hacerse un Papanicolau, es una buena idea hacerse un examen regularmente para asegurarse de que no hay otros problemas. Document Released: 09/22/2005 Document Revised: 07/13/2013 [facility] Patient Information 2015 [place], [place]. This information is not intended to replace advice given to you by your health care provider. Make sure you discuss any questions you have with your health care provider.

## 2015-04-19 NOTE — Progress Notes (Signed)
Reports burning pain in left breast for past 3 months; tender to touch; [name]

## 2015-04-19 NOTE — Progress Notes (Signed)
Patient ID: Theresa Barry, female   DOB: 25-May-1976, 39 y.o.   MRN: [id number]  Chief Complaint  Patient presents with  . Follow-up  bx result, heavy period,  Reports burning pain in left breast for past 3 months; tender to touch; [name] HPI Theresa Barry is a 39 y.o. female.  [id number] No LMP recorded. S/P bx of cervical mass which was benign polypoid tissue. Heavy bleeding today. Had prolapsed fibroid removed 2 years ago . IUD removed last visit, does not want to have more children HPI  Past Medical History  Diagnosis Date  . Medical history non-contributory     Past Surgical History  Procedure Laterality Date  . Ovarian cyst removal    . Hysteroscopy w/d&c N/A 05/16/2013    Procedure: DILATATION AND CURETTAGE /HYSTEROSCOPY  Removal of [name];  Surgeon: Osborne Oman, MD;  Location: [facility];  Service: Gynecology;  Laterality: N/A;    No family history on file.  Social History History  Substance Use Topics  . Smoking status: Never Smoker   . Smokeless tobacco: Not on file  . Alcohol Use: No    No Known Allergies  Current Outpatient Prescriptions  Medication Sig Dispense Refill  . ferrous sulfate 325 (65 FE) MG tablet Take 325 mg by mouth daily with breakfast.    . megestrol (MEGACE) 40 MG tablet Take 1 tablet (40 mg total) by mouth 2 (two) times daily. 30 tablet 3   No current facility-administered medications for this visit.    Review of Systems Review of Systems  Constitutional: Positive for fatigue.  Cardiovascular: Negative.   Gastrointestinal: Negative.   Endocrine:       Left breast burning at nipple for 3 mo  Genitourinary: Positive for vaginal bleeding and menstrual problem. Negative for vaginal discharge and pelvic pain.    Blood pressure 112/73, pulse 83, temperature 98.3 F (36.8 C), temperature source Oral, height 5\' 1"  (1.549 m), weight 176 lb 12.8 oz (80.196 kg).  Physical Exam Physical Exam   Constitutional: She is oriented to person, place, and time. She appears well-developed. No distress.  Pulmonary/Chest: Effort normal.  Breasts: breasts appear normal, no suspicious masses, no skin or nipple changes or axillary nodes.   Abdominal: Soft.  Genitourinary: Vaginal discharge (bleeding heavy) found.  3+ cm mass at os c/w fibroid, uterus 6 week size  Neurological: She is alert and oriented to person, place, and time.  Skin: Skin is warm and dry. No pallor.  Psychiatric: She has a normal mood and affect. Her behavior is normal.    Data Reviewed Bx result, [place] 02/2015  Assessment    Prolapsing fibroid and menorrhagia, wants definitive therapy   left breast discomfort with nl exam.   Plan    Financial assistance. Once approved schedule [facility]. Megace 40 mg BID, Lupron depot 11.25 mg when available  Dx mammogram left       [name] 04/19/2015, 4:17 PM

## 2015-04-26 ENCOUNTER — Ambulatory Visit
Admission: RE | Admit: 2015-04-26 | Discharge: 2015-04-26 | Disposition: A | Discharge status: Home / Self Care | Payer: No Typology Code available for payment source | Source: Ambulatory Visit | Attending: Obstetrics & Gynecology | Admitting: Obstetrics & Gynecology

## 2015-04-26 DIAGNOSIS — N644 Mastodynia: Secondary | ICD-10-CM

## 2015-05-14 ENCOUNTER — Ambulatory Visit: Payer: No Typology Code available for payment source | Admitting: Obstetrics & Gynecology

## 2015-06-18 ENCOUNTER — Inpatient Hospital Stay (HOSPITAL_COMMUNITY): Payer: No Typology Code available for payment source

## 2015-06-18 ENCOUNTER — Encounter (HOSPITAL_COMMUNITY): Payer: Self-pay | Admitting: *Deleted

## 2015-06-18 ENCOUNTER — Inpatient Hospital Stay (HOSPITAL_COMMUNITY)
Admission: AD | Admit: 2015-06-18 | Discharge: 2015-06-19 | Disposition: A | Discharge status: Home / Self Care | Payer: Self-pay | Source: Ambulatory Visit | Attending: Family Medicine | Admitting: Family Medicine

## 2015-06-18 DIAGNOSIS — Z3A01 Less than 8 weeks gestation of pregnancy: Secondary | ICD-10-CM | POA: Insufficient documentation

## 2015-06-18 DIAGNOSIS — O209 Hemorrhage in early pregnancy, unspecified: Secondary | ICD-10-CM

## 2015-06-18 DIAGNOSIS — O200 Threatened abortion: Secondary | ICD-10-CM

## 2015-06-18 DIAGNOSIS — D251 Intramural leiomyoma of uterus: Secondary | ICD-10-CM | POA: Insufficient documentation

## 2015-06-18 DIAGNOSIS — E119 Type 2 diabetes mellitus without complications: Secondary | ICD-10-CM | POA: Insufficient documentation

## 2015-06-18 DIAGNOSIS — O24311 Unspecified pre-existing diabetes mellitus in pregnancy, first trimester: Secondary | ICD-10-CM | POA: Insufficient documentation

## 2015-06-18 DIAGNOSIS — O3680X0 Pregnancy with inconclusive fetal viability, not applicable or unspecified: Secondary | ICD-10-CM

## 2015-06-18 HISTORY — DX: Anemia, unspecified: D64.9

## 2015-06-18 HISTORY — DX: Benign neoplasm of connective and other soft tissue, unspecified: D21.9

## 2015-06-18 HISTORY — DX: Type 2 diabetes mellitus without complications: E11.9

## 2015-06-18 LAB — CBC
HCT: 34.5 % — ABNORMAL LOW (ref 36.0–46.0)
HEMOGLOBIN: 11.3 g/dL — AB (ref 12.0–15.0)
MCH: 27.4 pg (ref 26.0–34.0)
MCHC: 32.8 g/dL (ref 30.0–36.0)
MCV: 83.5 fL (ref 78.0–100.0)
Platelets: 273 10*3/uL (ref 150–400)
RBC: 4.13 MIL/uL (ref 3.87–5.11)
RDW: 17.5 % — ABNORMAL HIGH (ref 11.5–15.5)
WBC: 8.8 10*3/uL (ref 4.0–10.5)

## 2015-06-18 LAB — URINALYSIS, ROUTINE W REFLEX MICROSCOPIC
Bilirubin Urine: NEGATIVE
GLUCOSE, UA: NEGATIVE mg/dL
Ketones, ur: NEGATIVE mg/dL
Nitrite: NEGATIVE
PROTEIN: NEGATIVE mg/dL
Specific Gravity, Urine: 1.01 (ref 1.005–1.030)
Urobilinogen, UA: 0.2 mg/dL (ref 0.0–1.0)
pH: 7 (ref 5.0–8.0)

## 2015-06-18 LAB — WET PREP, GENITAL
Clue Cells Wet Prep HPF POC: NONE SEEN
Trich, Wet Prep: NONE SEEN
Yeast Wet Prep HPF POC: NONE SEEN

## 2015-06-18 LAB — URINE MICROSCOPIC-ADD ON

## 2015-06-18 LAB — POCT PREGNANCY, URINE: Preg Test, Ur: POSITIVE — AB

## 2015-06-18 LAB — HCG, QUANTITATIVE, PREGNANCY: HCG, BETA CHAIN, QUANT, S: 18513 m[IU]/mL — AB (ref <5)

## 2015-06-18 NOTE — MAU Provider Note (Signed)
History     CSN: [id number]  Arrival date and time: 06/18/15 2057   None     No chief complaint on file.  HPI Comments: Theresa Barry is a 39 y.o. [id number] at 39 y.o. LMP 05/08/15. Vaginal bleeding today. She reports 2/10 pain.   Vaginal Bleeding The patient's primary symptoms include vaginal bleeding. This is a new problem. The current episode started today. The problem occurs constantly. The pain is mild (2/10). The problem affects both sides. She is pregnant. Associated symptoms include abdominal pain. Pertinent negatives include no constipation, diarrhea, dysuria, fever, frequency, nausea, urgency or vomiting. The vaginal discharge was bloody. The vaginal bleeding is heavier than menses. She has been passing clots (abuot the size of a ping pong ball.). She has not been passing tissue. Nothing aggravates the symptoms. She has tried nothing for the symptoms.    Past Medical History  Diagnosis Date  . Medical history non-contributory   . Fibroid   . Diabetes mellitus without complication   . Anemia     Past Surgical History  Procedure Laterality Date  . Ovarian cyst removal    . Hysteroscopy w/d&c N/A 05/16/2013    Procedure: DILATATION AND CURETTAGE /HYSTEROSCOPY  Removal of [name];  Surgeon: Osborne Oman, MD;  Location: [facility];  Service: Gynecology;  Laterality: N/A;  . Fibroidectomy      vaginally    History reviewed. No pertinent family history.  Social History  Substance Use Topics  . Smoking status: Never Smoker   . Smokeless tobacco: None  . Alcohol Use: No    Allergies: No Known Allergies  Prescriptions prior to admission  Medication Sig Dispense Refill Last Dose  . Prenatal Vit-Fe Fumarate-FA (MULTIVITAMIN-PRENATAL) 27-0.8 MG TABS tablet Take 1 tablet by mouth daily at 12 noon.   06/18/2015 at Unknown time  . ferrous sulfate 325 (65 FE) MG tablet Take 325 mg by mouth daily with breakfast.   Taking  . megestrol (MEGACE) 40 MG tablet Take 1  tablet (40 mg total) by mouth 2 (two) times daily. 30 tablet 3 More than a month at Unknown time    Review of Systems  Constitutional: Negative for fever.  Gastrointestinal: Positive for abdominal pain. Negative for nausea, vomiting, diarrhea and constipation.  Genitourinary: Positive for vaginal bleeding. Negative for dysuria, urgency and frequency.   Physical Exam   Blood pressure 107/72, pulse 78, temperature 97.9 F (36.6 C), temperature source Oral, resp. rate 16, height 5\' 2"  (1.575 m), weight 83.915 kg (185 lb), last menstrual period 05/08/2015, SpO2 100 %.  Physical Exam  Nursing note and vitals reviewed. Constitutional: She is oriented to person, place, and time. She appears well-developed and well-nourished. No distress.  HENT:  Head: Normocephalic.  Cardiovascular: Normal rate.   Respiratory: Effort normal.  GI: Soft. There is no tenderness. There is no rebound.  Neurological: She is alert and oriented to person, place, and time.  Skin: Skin is warm and dry.  Psychiatric: She has a normal mood and affect.     Results for orders placed or performed during the hospital encounter of 06/18/15 (from the past 24 hour(s))  Urinalysis, Routine w reflex microscopic (not at [facility])     Status: Abnormal   Collection Time: 06/18/15  9:47 PM  Result Value Ref Range   Color, Urine YELLOW YELLOW   APPearance HAZY (A) CLEAR   Specific Gravity, Urine 1.010 1.005 - 1.030   pH 7.0 5.0 - 8.0   Glucose, UA NEGATIVE NEGATIVE  mg/dL   Hgb urine dipstick LARGE (A) NEGATIVE   Bilirubin Urine NEGATIVE NEGATIVE   Ketones, ur NEGATIVE NEGATIVE mg/dL   Protein, ur NEGATIVE NEGATIVE mg/dL   Urobilinogen, UA 0.2 0.0 - 1.0 mg/dL   Nitrite NEGATIVE NEGATIVE   Leukocytes, UA MODERATE (A) NEGATIVE  Urine microscopic-add on     Status: None   Collection Time: 06/18/15  9:47 PM  Result Value Ref Range   Squamous Epithelial / LPF RARE RARE   WBC, UA 0-2 <3 WBC/hpf   RBC / HPF 21-50 <3 RBC/hpf    Bacteria, UA RARE RARE  Pregnancy, urine POC     Status: Abnormal   Collection Time: 06/18/15 10:10 PM  Result Value Ref Range   Preg Test, Ur POSITIVE (A) NEGATIVE  CBC     Status: Abnormal   Collection Time: 06/18/15 10:20 PM  Result Value Ref Range   WBC 8.8 4.0 - 10.5 K/uL   RBC 4.13 3.87 - 5.11 MIL/uL   Hemoglobin 11.3 (L) 12.0 - 15.0 g/dL   HCT 34.5 (L) 36.0 - 46.0 %   MCV 83.5 78.0 - 100.0 fL   MCH 27.4 26.0 - 34.0 pg   MCHC 32.8 30.0 - 36.0 g/dL   RDW 17.5 (H) 11.5 - 15.5 %   Platelets 273 150 - 400 K/uL  hCG, quantitative, pregnancy     Status: Abnormal   Collection Time: 06/18/15 10:20 PM  Result Value Ref Range   hCG, Beta Chain, Quant, S 18513 (H) <5 mIU/mL  Wet prep, genital     Status: Abnormal   Collection Time: 06/18/15 10:20 PM  Result Value Ref Range   Yeast Wet Prep HPF POC NONE SEEN NONE SEEN   Trich, Wet Prep NONE SEEN NONE SEEN   Clue Cells Wet Prep HPF POC NONE SEEN NONE SEEN   WBC, Wet Prep HPF POC FEW (A) NONE SEEN   US Ob Comp Less 14 Wks  06/18/2015   CLINICAL DATA:  Vaginal bleeding since 8 p.m. tonight. Lower abdominal pain. Estimated gestational age [redacted] weeks per 6 days per LMP.  EXAM: OBSTETRIC <14 WK [place] AND TRANSVAGINAL OB US  TECHNIQUE: Both transabdominal and transvaginal ultrasound examinations were performed for complete evaluation of the gestation as well as the maternal uterus, adnexal regions, and pelvic cul-de-sac. Transvaginal technique was performed to assess early pregnancy.  COMPARISON:  None.  FINDINGS: Intrauterine gestational sac: Visualized/normal in shape.  Yolk sac:  Not visualized.  Embryo:  Not visualized.  Cardiac Activity: Not visualized.  Heart Rate: Not visualized.  MSD: 10  mm   5 w   5  d  No evidence of subchorionic hemorrhage.  Maternal uterus/adnexae: Intramural fibroid over the posterior aspect of the lower uterine segment measuring 3.5 cm in diameter. Several cystic structures in the region of the cervix likely nabothian  cysts. Ovaries are within normal.  IMPRESSION: Intrauterine gestational sac without yolk sac or embryo. Mean sac diameter compatible with estimated gestational age [redacted] weeks 5 days. This may represent a normal early pregnancy. Recommend correlation with quantitative beta HCG and serial quantitative beta HCG and follow-up ultrasound 14 days.  Intramural fibroid over the posterior lower uterine segment measuring 3.5 cm.   Electronically Signed   By: Marin Olp M.D.   On: 06/18/2015 23:48   US Ob Transvaginal  06/18/2015   CLINICAL DATA:  Vaginal bleeding since 8 p.m. tonight. Lower abdominal pain. Estimated gestational age [redacted] weeks per 6 days per LMP.  EXAM:  OBSTETRIC <14 WK [place] AND TRANSVAGINAL OB US  TECHNIQUE: Both transabdominal and transvaginal ultrasound examinations were performed for complete evaluation of the gestation as well as the maternal uterus, adnexal regions, and pelvic cul-de-sac. Transvaginal technique was performed to assess early pregnancy.  COMPARISON:  None.  FINDINGS: Intrauterine gestational sac: Visualized/normal in shape.  Yolk sac:  Not visualized.  Embryo:  Not visualized.  Cardiac Activity: Not visualized.  Heart Rate: Not visualized.  MSD: 10  mm   5 w   5  d  No evidence of subchorionic hemorrhage.  Maternal uterus/adnexae: Intramural fibroid over the posterior aspect of the lower uterine segment measuring 3.5 cm in diameter. Several cystic structures in the region of the cervix likely nabothian cysts. Ovaries are within normal.  IMPRESSION: Intrauterine gestational sac without yolk sac or embryo. Mean sac diameter compatible with estimated gestational age [redacted] weeks 5 days. This may represent a normal early pregnancy. Recommend correlation with quantitative beta HCG and serial quantitative beta HCG and follow-up ultrasound 14 days.  Intramural fibroid over the posterior lower uterine segment measuring 3.5 cm.   Electronically Signed   By: Marin Olp M.D.   On: 06/18/2015 23:48     MAU Course  Procedures  MDM   Assessment and Plan   1. Threatened abortion in first trimester   2. Vaginal bleeding in pregnancy, first trimester   3. Pregnancy of unknown anatomic location    DC home Comfort measures reviewed  1stTrimester precautions  Bleeding precautions Ectopic precautions RX: none  Return to MAU in 48 hours   Follow-up Information    Follow up with [facility] In 2 days.   Contact information:   [address] [id number] [place] [phone]        Mathis Bud 06/18/2015, 11:52 PM

## 2015-06-18 NOTE — MAU Note (Signed)
Pt states she is [redacted] weeks pregnant and she has a fibroid and tonight she began bleeding and passing clots. Also reports pain in lower abd like contractions.

## 2015-06-19 DIAGNOSIS — O200 Threatened abortion: Secondary | ICD-10-CM

## 2015-06-19 LAB — GC/CHLAMYDIA PROBE AMP (~~LOC~~) NOT AT ARMC
CHLAMYDIA, DNA PROBE: NEGATIVE
Neisseria Gonorrhea: NEGATIVE

## 2015-06-19 LAB — HIV ANTIBODY (ROUTINE TESTING W REFLEX): HIV SCREEN 4TH GENERATION: NONREACTIVE

## 2015-06-19 NOTE — Discharge Instructions (Signed)
Amenaza de aborto (Threatened Miscarriage) La amenaza de aborto se produce cuando hay hemorragia vaginal durante las primeras 20semanas de embarazo, pero el embarazo no se interrumpe. Si durante este perodo usted tiene hemorragia vaginal, el mdico le har pruebas para asegurarse de que el embarazo contine. Si las pruebas muestran que usted contina embarazada y que el "beb" en desarrollo (feto) dentro del tero sigue creciendo, se considera que tuvo una amenaza de aborto. La amenaza de aborto no implica que el embarazo vaya a terminar, pero s aumenta el riesgo de perder el embarazo (aborto completo). CAUSAS  Por lo general, no se conoce la causa de la amenaza de aborto. Si el resultado final es el aborto completo, la causa ms frecuente es la cantidad anormal de cromosomas del feto. Los cromosomas son las estructuras internas de las clulas que contienen todo el material gentico. Algunas de las causas de hemorragia vaginal que no ocasionan un aborto incluyen:  Las relaciones sexuales.  Las infecciones.  Los cambios hormonales normales durante el embarazo.  La hemorragia que se produce cuando el vulo se implanta en el tero. FACTORES DE RIESGO Los factores de riesgo de hemorragia al principio del embarazo incluyen:  Obesidad.  Fumar.  El consumo de cantidades excesivas de alcohol o cafena.  El consumo de drogas. SIGNOS Y SNTOMAS  Hemorragia vaginal leve.  Dolor o clicos abdominales leves. DIAGNSTICO  Si tiene hemorragia con o sin dolor abdominal antes de las 20semanas de embarazo, el mdico le har pruebas para determinar si el embarazo contina. Una prueba importante incluye el uso de ondas sonoras y de una computadora (ecografa) para crear imgenes del interior del tero. Otras pruebas incluyen el examen interno de la vagina y el tero (examen plvico), y el control de la frecuencia cardaca del feto.  Es posible que le diagnostiquen una amenaza de aborto en los  siguientes casos:  La ecografa muestra que el embarazo contina.  La frecuencia cardaca del feto es alta.  El examen plvico muestra que la apertura entre el tero y la vagina (cuello del tero) est cerrada.  Su frecuencia cardaca y su presin arterial estn estables.  Los anlisis de sangre confirman que el embarazo contina. TRATAMIENTO  No se ha demostrado que ningn tratamiento evite que una amenaza de aborto se convierta en un aborto completo. Sin embargo, los cuidados adecuados en el hogar son importantes.  INSTRUCCIONES PARA EL CUIDADO EN EL HOGAR   Asegrese de asistir a todas las citas de cuidados prenatales. Esto es muy importante.  Descanse lo suficiente.  No tenga relaciones sexuales ni use tampones si tiene hemorragia vaginal.  No se haga duchas vaginales.  No fume ni consuma drogas.  No beba alcohol.  Evite la cafena. SOLICITE ATENCIN MDICA SI:  Tiene una ligera hemorragia o manchado vaginal durante el embarazo.  Tiene dolor o clicos en el abdomen.  Tiene fiebre. SOLICITE ATENCIN MDICA DE INMEDIATO SI:  Tiene una hemorragia vaginal abundante.  Elimina cogulos de sangre por la vagina.  Siente dolor en la parte baja de la espalda o clicos abdominales intensos.  Tiene fiebre, escalofros y dolor abdominal intenso. ASEGRESE DE QUE:  Comprende estas instrucciones.  Controlar su afeccin.  Recibir ayuda de inmediato si no mejora o si empeora. Document Released: 07/02/2005 Document Revised: 09/27/2013 ExitCare Patient Information 2015 [employer]. This information is not intended to replace advice given to you by your health care provider. Make sure you discuss any questions you have with your health care provider.  

## 2015-06-20 ENCOUNTER — Inpatient Hospital Stay (HOSPITAL_COMMUNITY)
Admission: AD | Admit: 2015-06-20 | Discharge: 2015-06-20 | Disposition: A | Discharge status: Home / Self Care | Payer: Self-pay | Source: Ambulatory Visit | Attending: Obstetrics & Gynecology | Admitting: Obstetrics & Gynecology

## 2015-06-20 DIAGNOSIS — O09521 Supervision of elderly multigravida, first trimester: Secondary | ICD-10-CM | POA: Insufficient documentation

## 2015-06-20 DIAGNOSIS — O0281 Inappropriate change in quantitative human chorionic gonadotropin (hCG) in early pregnancy: Secondary | ICD-10-CM | POA: Insufficient documentation

## 2015-06-20 DIAGNOSIS — Z3A01 Less than 8 weeks gestation of pregnancy: Secondary | ICD-10-CM | POA: Insufficient documentation

## 2015-06-20 DIAGNOSIS — O020 Blighted ovum and nonhydatidiform mole: Secondary | ICD-10-CM | POA: Insufficient documentation

## 2015-06-20 LAB — HCG, QUANTITATIVE, PREGNANCY: hCG, Beta Chain, Quant, S: 29622 m[IU]/mL — ABNORMAL HIGH (ref <5)

## 2015-06-20 LAB — RPR: RPR: NONREACTIVE

## 2015-06-20 MED ORDER — MISOPROSTOL 200 MCG PO TABS: 800.0000 ug | ORAL_TABLET | Freq: Once | ORAL | Status: DC

## 2015-06-20 MED ORDER — PROMETHAZINE HCL 25 MG PO TABS: 12.5000 mg | ORAL_TABLET | Freq: Four times a day (QID) | ORAL | Status: DC | PRN

## 2015-06-20 MED ORDER — HYDROCODONE-ACETAMINOPHEN 5-325 MG PO TABS: 1.0000 | ORAL_TABLET | ORAL | Status: DC | PRN

## 2015-06-20 NOTE — MAU Provider Note (Signed)
History     CSN: [id number]  Arrival date and time: 06/20/15 2041   None     No chief complaint on file.  HPI Comments: Theresa Barry is a 39 y.o.  [id number] at [redacted]w[redacted]d who presents today for FU HCG. She states that after being seen here the bleeding was decreased. She denies any pain at this time.   Vaginal Bleeding The patient's primary symptoms include vaginal bleeding. This is a new problem. The current episode started in the past 7 days. The problem occurs intermittently. The problem has been gradually improving. The patient is experiencing no pain. She is pregnant. Pertinent negatives include no abdominal pain, constipation, diarrhea, dysuria, fever, frequency, nausea, urgency or vomiting. The vaginal discharge was bloody. The vaginal bleeding is spotting. She has not been passing clots. She has not been passing tissue. Nothing aggravates the symptoms. She has tried nothing for the symptoms.    Past Medical History  Diagnosis Date  . Medical history non-contributory   . Fibroid   . Diabetes mellitus without complication   . Anemia     Past Surgical History  Procedure Laterality Date  . Ovarian cyst removal    . Hysteroscopy w/d&c N/A 05/16/2013    Procedure: DILATATION AND CURETTAGE /HYSTEROSCOPY  Removal of Theresa Barry FIBROID;  Surgeon: Osborne Oman, MD;  Location: [facility];  Service: Gynecology;  Laterality: N/A;  . Fibroidectomy      vaginally    No family history on file.  Social History  Substance Use Topics  . Smoking status: Never Smoker   . Smokeless tobacco: Not on file  . Alcohol Use: No    Allergies: No Known Allergies  Prescriptions prior to admission  Medication Sig Dispense Refill Last Dose  . ferrous sulfate 325 (65 FE) MG tablet Take 325 mg by mouth daily with breakfast.   Taking  . megestrol (MEGACE) 40 MG tablet Take 1 tablet (40 mg total) by mouth 2 (two) times daily. 30 tablet 3 More than a month at Unknown time  . Prenatal Vit-Fe  Fumarate-FA (MULTIVITAMIN-PRENATAL) 27-0.8 MG TABS tablet Take 1 tablet by mouth daily at 12 noon.   06/18/2015 at Unknown time    Review of Systems  Constitutional: Negative for fever.  Gastrointestinal: Negative for nausea, vomiting, abdominal pain, diarrhea and constipation.  Genitourinary: Positive for vaginal bleeding. Negative for dysuria, urgency and frequency.   Physical Exam   Last menstrual period 05/08/2015.  Physical Exam  Nursing note and vitals reviewed. Constitutional: She is oriented to person, place, and time. She appears well-developed and well-nourished. No distress.  HENT:  Head: Normocephalic.  Cardiovascular: Normal rate.   Respiratory: Effort normal.  GI: Soft. There is no tenderness.  Neurological: She is alert and oriented to person, place, and time.  Skin: Skin is warm and dry.  Psychiatric: She has a normal mood and affect.     Results for MEKENNA, Barry (MRN [id number]) as of 06/20/2015 22:12  Ref. Range 06/18/2015 22:20 06/18/2015 23:36 06/20/2015 21:14  HCG, Beta Chain, Quant, S Latest Ref Range: <5 mIU/mL 18513 (H)  93818 (H)   MAU Course  Procedures  MDM 2215: D/W Dr. Elonda Husky, can offer patient cytotectoday or FU in 48 hours for confirmed downward trend.   D/W the patient at length. Reviewed R/B/A and patient elects to proceed with cytotec at this time. Will place at home. Reviewed what to expect, and answered all questions.         Early Intrauterine Pregnancy Failure  x Documented intrauterine pregnancy failure less than or equal to [redacted] weeks gestation  x No serious current illness  x  Baseline Hgb greater than or equal to 10g/dl  x  Patient has easily accessible transportation to the hospital  x  Clear preference  x  Practitioner/physician deems patient reliable  x  Counseling by practitioner or physician  x Patient education by RN  ___  Consent form signed  NA  Rho-Gam given by RN if indicated  ___ Medication  dispensed   x   Cytotec 800 mcg  x   Intravaginally by patient at home         __   Intravaginally by RN in MAU        __   Rectally by patient at home        __   Rectally by RN in MAU  ___  Ibuprofen 600 mg 1 tablet by mouth every 6 hours as needed #30  x  Hydrocodone/acetaminophen 5/325 mg by mouth every 4 to 6 hours as needed  xPhenergan 12.5 mg by mouth every 4 hours as needed for nausea    Assessment and Plan   1. Inappropriate change in quantitative hCG in early pregnancy   2. Blighted ovum    DC home Comfort measures reviewed  What to expect with cytotec reviewed with the patient  Bleeding precautions RX: cytotec, phenergan and vicodin  Return to MAU as needed FU with clinic in 2 weeks 07/04/15 AT 1:00    Mathis Bud 06/20/2015, 10:16 PM

## 2015-06-20 NOTE — MAU Note (Signed)
Pt denies pain, some pink on tissue when she wipes.

## 2015-06-20 NOTE — Discharge Instructions (Signed)
Embarazo anembriónico  °(Blighted Ovum) °Un huevo sin embrión (embarazo anembriónico) se produce cuando un huevo fertilizado (embrión) se adhiere a la pared uterina, pero el embrión no se desarrolla. El saco embrionario (placenta) sigue creciendo a pesar de que el embrión no crece crece ni se desarrolla. La hormona del embarazo se sigue produciendo poquer la placenta se ha formado. Esto dará como resultado una prueba de embarazo positiva a pesar de tener un embarazo anormal. Esto ocurre dentro del primer trimestre, a veces antes que una mujer sepa que está embarazada.   °CAUSAS  °Generalmente es el resultado de problemas cromosómicos. Una de las causas puede ser una división celular anormal o esperma u óvulos de baja calidad.  °SÍNTOMAS  °Al principio, pueden experimentarse signos de embarazo, como por ejemplo:  °· Falta del periodo menstrual. °· Fatiga. °· Ganas de vomitar (náuseas). °· Dolor en las mamas. °· Test de embarazo positivo. °Luego, puede haber signos de aborto, como:  °· Cólicos abdominales. °· Sangrado o hemorragia vaginal. °· Período menstrual más abundante que lo normal. °DIAGNÓSTICO  °El diagnóstico se realiza con una ecografia que muestra un útero o un saco gestacional vacío.  °TRATAMIENTO  °El médico la ayudará a determinar cuál es el mejor tratamiento para usted. El tratamiento puede ser:  °· Dejar que el cuerpo elimine naturalmente el tejido del embarazo anembriónico. °· Tomar medicamentos para desencadenar el embarazo. °· Realizar un procedimiento llamado dilatación y curetaje (D & C) para eliminar los tejidos de la placenta.   °La D & C puede ser útil si desea examinar el tejido para determinar la causa del aborto. Hable con su médico acerca de los riesgos que implica este procedimiento.  °INSTRUCCIONES PARA EL CUIDADO EN EL HOGAR  °· Haga un seguimiento con su médico para asegurarse de que la hormona del embarazo vuelve a cero. °· Espere por lo menos 1 a 3 ciclos menstruales regulares antes  de intentar quedar embarazada de nuevo, o según lo recomendado por su médico. °SOLICITE ATENCIÓN MÉDICA DE INMEDIATO SI:  °· Siente un dolor abdominal cada vez más intenso. °· Tiene una hemorragia abundante o usa 1 a 2 apósitos por hora durante más de 2 horas. °· Se siente confundida, se marea o pierde el conocimiento. °Document Released: 06/04/2011 Document Revised: 12/15/2011 °ExitCare® Patient Information ©2015 [employer]. This information is not intended to replace advice given to you by your health care provider. Make sure you discuss any questions you have with your health care provider. ° °

## 2015-07-04 ENCOUNTER — Ambulatory Visit (INDEPENDENT_AMBULATORY_CARE_PROVIDER_SITE_OTHER): Payer: Self-pay | Admitting: Obstetrics & Gynecology

## 2015-07-04 ENCOUNTER — Encounter: Payer: Self-pay | Admitting: Obstetrics & Gynecology

## 2015-07-04 VITALS — BP 107/75 | HR 79 | Temp 98.6°F | Wt 180.6 lb

## 2015-07-04 DIAGNOSIS — O039 Complete or unspecified spontaneous abortion without complication: Secondary | ICD-10-CM

## 2015-07-04 LAB — CBC
HEMATOCRIT: 34.5 % — AB (ref 36.0–46.0)
Hemoglobin: 11 g/dL — ABNORMAL LOW (ref 12.0–15.0)
MCH: 27.2 pg (ref 26.0–34.0)
MCHC: 31.9 g/dL (ref 30.0–36.0)
MCV: 85.2 fL (ref 78.0–100.0)
MPV: 10.9 fL (ref 8.6–12.4)
PLATELETS: 371 10*3/uL (ref 150–400)
RBC: 4.05 MIL/uL (ref 3.87–5.11)
RDW: 16.2 % — AB (ref 11.5–15.5)
WBC: 6.6 10*3/uL (ref 4.0–10.5)

## 2015-07-04 NOTE — Progress Notes (Signed)
Patient ID: Theresa Barry, female   DOB: 1976/03/26, 39 y.o.   MRN: [id number] Spanish Interpreter present for encounter.

## 2015-07-04 NOTE — Progress Notes (Signed)
Patient ID: Theresa Barry, female   DOB: [birth year], 39 y.o.   MRN: [id number]  Chief Complaint  Patient presents with  . Follow-up  MAU 9/12 with early miscarriage, still bleeding  HPI Theresa Barry is a 40 y.o. female.  [id number] Patient's last menstrual period was 05/08/2015. 5.[redacted] week gestation sac 9/12 treated with cytotec, still has bleeding with small clots. Ho prolapsed fibroid was to receive lupron depot in July but never got the medication  HPI  Past Medical History  Diagnosis Date  . Medical history non-contributory   . Fibroid   . Diabetes mellitus without complication   . Anemia     Past Surgical History  Procedure Laterality Date  . Ovarian cyst removal    . Hysteroscopy w/d&c N/A 05/16/2013    Procedure: DILATATION AND CURETTAGE /HYSTEROSCOPY  Removal of Theresa Barry FIBROID;  Surgeon: Osborne Oman, MD;  Location: [facility];  Service: Gynecology;  Laterality: N/A;  . Fibroidectomy      vaginally    No family history on file.  Social History Social History  Substance Use Topics  . Smoking status: Never Smoker   . Smokeless tobacco: None  . Alcohol Use: No    No Known Allergies  Current Outpatient Prescriptions  Medication Sig Dispense Refill  . ferrous sulfate 325 (65 FE) MG tablet Take 325 mg by mouth daily with breakfast.    . Prenatal Vit-Fe Fumarate-FA (MULTIVITAMIN-PRENATAL) 27-0.8 MG TABS tablet Take 1 tablet by mouth daily at 12 noon.    Marland Kitchen HYDROcodone-acetaminophen (NORCO/VICODIN) 5-325 MG per tablet Take 1-2 tablets by mouth every 4 (four) hours as needed. (Patient not taking: Reported on 07/04/2015) 15 tablet 0  . megestrol (MEGACE) 40 MG tablet Take 1 tablet (40 mg total) by mouth 2 (two) times daily. (Patient not taking: Reported on 07/04/2015) 30 tablet 3  . promethazine (PHENERGAN) 25 MG tablet Take 0.5-1 tablets (12.5-25 mg total) by mouth every 6 (six) hours as needed. (Patient not taking: Reported on 07/04/2015) 30 tablet 0   No  current facility-administered medications for this visit.    Review of Systems Review of Systems  Constitutional: Negative.   Respiratory: Negative.   Gastrointestinal: Negative.   Genitourinary: Positive for vaginal bleeding. Negative for menstrual problem.    Blood pressure 107/75, pulse 79, temperature 98.6 F (37 C), weight 180 lb 9.6 oz (81.92 kg), last menstrual period 05/08/2015, unknown if currently breastfeeding.  Physical Exam Physical Exam  Constitutional: She is oriented to person, place, and time. She appears well-developed. No distress.  Abdominal: Soft.  Genitourinary: Vagina normal.  Mod blood prolapsed fibroid at os  Neurological: She is alert and oriented to person, place, and time.  Skin: Skin is warm and dry. No pallor.  Psychiatric: She has a normal mood and affect. Her behavior is normal.    Data Reviewed [place] result Quant HCG results   Assessment    Likely complete Sab Fibroid uterus     Plan    Repeat CBC and HCG. RTC 2 weeks for lupron and schedule TV[facility]        Hancel Ion 07/04/2015, 2:07 PM

## 2015-07-04 NOTE — Patient Instructions (Signed)
Aborto espontneo  (Miscarriage) El aborto espontneo es la prdida de un beb que no ha nacido (feto) antes de la semana 20 del Media planner. La mayor parte de estos abortos ocurre en los primeros 3 meses. En algunos casos ocurre antes de que la mujer sepa que est [place]. Tambin se denomina "aborto espontneo" o "prdida prematura del embarazo". El aborto espontneo puede ser Ardelia Mems experiencia que afecte emocionalmente a Geologist, engineering. Converse con su mdico si tiene dudas, cmo es el proceso de [place], y sobre planes futuros de Media planner.  CAUSAS   Algunos problemas cromosmicos pueden hacer imposible que el beb se desarrolle normalmente. Los problemas con los genes o cromosomas del beb son generalmente el resultado de errores que se producen, por casualidad, cuando el embrin se divide y crece. Estos problemas no se heredan de los [place].  Infeccin en el cuello del tero.   Problemas hormonales.   Problemas en el cuello del tero, como tener un tero incompetente. Esto ocurre cuando los tejidos no son lo suficientemente fuertes como para Risk manager.   Problemas del tero, como un tero con forma anormal, los fibromas o anormalidades congnitas.   Ciertas enfermedades crnicas.   No fume, no beba alcohol, ni consuma drogas.   Traumatismos  A veces, la causa es desconocida.  SNTOMAS   Sangrado o manchado vaginal, con o sin clicos o dolor.  Dolor o clicos en el abdomen o en la cintura.  Eliminacin de lquido, tejidos o cogulos grandes por la vagina. DIAGNSTICO  El [employer] har un examen fsico. Tambin le indicar una ecografa para confirmar el aborto. Es posible que se realicen anlisis de [place].  TRATAMIENTO   En algunos casos el tratamiento no es necesario, si se eliminan naturalmente todos los tejidos embrionarios que se encontraban en el tero. Si el feto o la placenta quedan dentro del tero (aborto incompleto), pueden infectarse, los tejidos que quedan  pueden infectarse y deben retirarse. Generalmente se realiza un procedimiento de dilatacin y curetaje (D y C). Durante el procedimiento de dilatacin y curetaje, el cuello del tero se abre (dilata) y se retira cualquier resto de tejido fetal o placentario del tero.  Si hay una infeccin, le recetarn antibiticos. Podrn recetarle otros medicamentos para reducir el tamao del tero (contraerlo) si hay una mucho sangrado.  Si su sangre es Rh negativa y su beb es Rh positivo, usted necesitar la inyeccin de inmunoglobulina Rh. Esta inyeccin proteger a los futuros bebs de tener problemas de compatibilidad Rh en futuros embarazos. INSTRUCCIONES PARA EL CUIDADO EN EL HOGAR   El mdico le indicar reposo en cama o le permitir Automotive engineer. Vuelva a la actividad lentamente o segn las indicaciones de su mdico.  Pdale a alguien que la ayude con las responsabilidades familiares y del hogar durante este tiempo.   Lleve un registro de la cantidad y la saturacin de las toallas higinicas que Medical laboratory scientific officer. Anote esta informacin   No use tampones. No No se haga duchas vaginales ni tenga relaciones sexuales hasta que el mdico la autorice.   Slo tome medicamentos de venta libre o recetados para Glass blower/designer o Health and safety inspector, segn las indicaciones de su mdico.   No tome aspirina. La aspirina puede ocasionar hemorragias.   Concurra puntualmente a las citas de control con el mdico.   Si usted o su pareja tienen dificultades con el duelo, hable con su mdico para buscar la ayuda psicolgica que los ayude a enfrentar la prdida  del embarazo. Permítase el tiempo suficiente de duelo antes de quedar embarazada nuevamente.   °SOLICITE ATENCIÓN MÉDICA DE INMEDIATO SI:  °· Siente calambres intensos o dolor en la espalda o en el abdomen. °· Tiene fiebre. °· Elimina grandes coágulos de sangre (del tamaño de una nuez o más) o tejidos por la vagina. Guarde lo que ha eliminado para  que su médico lo examine.   °· La hemorragia aumenta.   °· Observa una secreción vaginal espesa y con mal olor. °· Se siente mareada, débil, o se desmaya.   °· Siente escalofríos.   °ASEGÚRESE DE QUE:  °· Comprende estas instrucciones. °· Controlará su enfermedad. °· Solicitará ayuda de inmediato si no mejora o si empeora. °Document Released: 07/02/2005 Document Revised: 01/17/2013 °ExitCare® Patient Information ©2015 [employer]. This information is not intended to replace advice given to you by your health care provider. Make sure you discuss any questions you have with your health care provider. ° °

## 2015-07-05 ENCOUNTER — Telehealth: Payer: Self-pay | Admitting: General Practice

## 2015-07-05 LAB — HCG, QUANTITATIVE, PREGNANCY: HCG, BETA CHAIN, QUANT, S: 28.5 m[IU]/mL

## 2015-07-05 NOTE — Telephone Encounter (Signed)
Contacted patient with Theresa Barry Spanish Interpreter.  Bhcg results given and recommendation by Dr. Roselie Awkward.  Pt verbalizes understanding, has no further questions.

## 2015-07-05 NOTE — Telephone Encounter (Signed)
Per Dr Roselie Awkward, patient's bhcg indicates complete SAB. Patient to come in for lupron. Patient may need to fill out assistance application. Called patient with [employer] for intepreter, no answer- left message to call us back at the clinics

## 2015-07-19 ENCOUNTER — Ambulatory Visit (INDEPENDENT_AMBULATORY_CARE_PROVIDER_SITE_OTHER): Payer: Self-pay | Admitting: Obstetrics & Gynecology

## 2015-07-19 ENCOUNTER — Encounter: Payer: Self-pay | Admitting: Obstetrics & Gynecology

## 2015-07-19 VITALS — BP 122/81 | HR 79 | Temp 98.9°F | Ht 61.0 in | Wt 178.3 lb

## 2015-07-19 DIAGNOSIS — D260 Other benign neoplasm of cervix uteri: Secondary | ICD-10-CM

## 2015-07-19 DIAGNOSIS — D259 Leiomyoma of uterus, unspecified: Secondary | ICD-10-CM

## 2015-07-19 MED ORDER — MEGESTROL ACETATE 40 MG PO TABS: 40.0000 mg | ORAL_TABLET | Freq: Two times a day (BID) | ORAL | Status: DC

## 2015-07-19 MED ORDER — LEUPROLIDE ACETATE (3 MONTH) 11.25 MG IM KIT
11.2500 mg | PACK | Freq: Once | INTRAMUSCULAR | Status: AC
  Administered 2015-08-20: 11.25 mg via INTRAMUSCULAR

## 2015-07-19 NOTE — Progress Notes (Signed)
Spanish interpreter Truitt Merle; Pt reports continuing to bleed since the miscarriage.  Pt states that she has some spotting at times and others she has a regular pad.  Pt given # to Free Pap Screening and Depo Lupron Assistance paperwork given to pt to fill out and bring back to the front along with verification of income.  Stressed to the pt the importance of returning application with income asap so that she can receive injection to help with her fibroids/bleeding.  Pt stated understanding.

## 2015-07-19 NOTE — Patient Instructions (Signed)
Fibromas uterinos (Uterine Fibroids) Los fibromas uterinos son masas (tumores) de tejido que pueden desarrollarse en el vientre (tero). Tambin se los conoce como liomiomas. Este tipo de tumor no es canceroso (benigno) y no se disemina a otras partes del cuerpo fuera de la zona plvica, la cual se encuentra entre los huesos de la cadera. En ocasiones, los fibromas pueden crecer en las trompas de [place], en el cuello del tero o en las estructuras de soporte (ligamentos) que rodean el tero. Una mujer puede tener uno o ms fibromas. Los fibromas pueden tener diferente tamao y peso, y crecer en distintas partes del tero. Algunos pueden crecer hasta volverse bastante grandes. La mayora no requiere tratamiento mdico. CAUSAS Un fibroma puede desarrollarse cuando una nica clula uterina contina creciendo (se multiplica). La mayora de las clulas del cuerpo humano tienen un mecanismo de control que impide que se multipliquen sin control. SIGNOS Y SNTOMAS Entre los sntomas se pueden incluir los siguientes:   Hemorragias intensas durante la menstruacin.  Prdidas de sangre o hemorragias entre los perodos.  Dolor y opresin en la pelvis.  Problemas de la vejiga, como necesidad de orinar con ms frecuencia (polaquiuria) o necesidad imperiosa de orinar.  Incapacidad para reproducir (infertilidad).  Abortos espontneos. DIAGNSTICO Los fibromas uterinos se diagnostican con un examen fsico. El mdico puede palpar los tumores grumosos durante un examen plvico. Pueden realizarse ecografas y una resonancia magntica para determinar el tamao y la ubicacin de los fibromas, as como la cantidad. TRATAMIENTO El tratamiento puede incluir lo siguiente:  Observacin cautelosa. Esto requiere que el mdico controle el fibroma para saber si crece o se achica. Siga las recomendaciones del mdico respecto de la frecuencia con la que debe realizarse los controles.  Medicamentos hormonales. Pueden  tomarse por va oral o administrarse a travs de un dispositivo intrauterino (DIU).  Ciruga.  Extirpacin de los fibromas (miomectoma) o del tero (histerectoma).  Suprimir la irrigacin sangunea a los fibromas (embolizacin de la arteria uterina). Si los fibromas le traen problemas de fertilidad y tiene deseos de quedar embarazada, el mdico puede recomendar su extirpacin.  INSTRUCCIONES PARA EL CUIDADO EN EL HOGAR  Concurra a todas las visitas de control como se lo haya indicado el mdico. Esto es importante.  Tome los medicamentos solamente como se lo haya indicado el mdico.  Si le recetaron un tratamiento hormonal, tome los medicamentos hormonales exactamente como se lo indicaron.  No tome aspirina, ya que puede causar hemorragias.  Consulte al mdico sobre tomar comprimidos de hierro y aumentar la cantidad de verduras de hoja color verde oscuro en la dieta. Estas medidas pueden ayudar a incrementar los niveles de hierro en la sangre, que pueden verse afectados por las hemorragias menstruales intensas.  Preste mucha atencin a la menstruacin e informe al mdico si hay algn cambio, por ejemplo:  Aumento del flujo de sangre que le exige el uso de ms compresas o tampones que los que utiliza normalmente cada mes.  Un cambio en la cantidad de das que le dura la menstruacin cada mes.  Un cambio en los sntomas asociados con la menstruacin, como clicos abdominales o dolor de espalda. SOLICITE ATENCIN MDICA SI:  Tiene dolor plvico, dolor de espalda o clicos abdominales que los medicamentos no pueden controlar.  Observa un aumento del sangrado entre y durante las menstruaciones.  Empapa los tampones o las compresas en el trmino de media hora o menos tiempo.  Se siente mareada, muy cansada o dbil. SOLICITE ATENCIN MDICA DE INMEDIATO   SI:  Se desmaya.  El dolor plvico aumenta repentinamente.   Esta informacin no tiene como fin reemplazar el consejo del mdico.  Asegrese de hacerle al mdico cualquier pregunta que tenga.   Document Released: 09/22/2005 Document Revised: 10/13/2014 Elsevier Interactive Patient Education 2016 Elsevier Inc.  

## 2015-07-20 NOTE — Progress Notes (Signed)
Patient ID: Theresa Barry, female   DOB: 01-Nov-1975, 38 y.o.   MRN: [id number]  Chief Complaint  Patient presents with  . Follow-up    SAB; continuing to bleeding     HPI Theresa Barry is a 39 y.o. female.  Still bleeding but less, f/u after miscarriage and wants to schedule surgery  HPI  Past Medical History  Diagnosis Date  . Medical history non-contributory   . Fibroid   . Diabetes mellitus without complication ([place])   . Anemia     Past Surgical History  Procedure Laterality Date  . Ovarian cyst removal    . Hysteroscopy w/d&c N/A 05/16/2013    Procedure: DILATATION AND CURETTAGE /HYSTEROSCOPY  Removal of [name];  Surgeon: Osborne Oman, MD;  Location: [facility];  Service: Gynecology;  Laterality: N/A;  . Fibroidectomy      vaginally    No family history on file.  Social History Social History  Substance Use Topics  . Smoking status: Never Smoker   . Smokeless tobacco: None  . Alcohol Use: No    No Known Allergies  Current Outpatient Prescriptions  Medication Sig Dispense Refill  . Prenatal Vit-Fe Fumarate-FA (MULTIVITAMIN-PRENATAL) 27-0.8 MG TABS tablet Take 1 tablet by mouth daily at 12 noon.    . ferrous sulfate 325 (65 FE) MG tablet Take 325 mg by mouth daily with breakfast.    . HYDROcodone-acetaminophen (NORCO/VICODIN) 5-325 MG per tablet Take 1-2 tablets by mouth every 4 (four) hours as needed. (Patient not taking: Reported on 07/04/2015) 15 tablet 0  . megestrol (MEGACE) 40 MG tablet Take 1 tablet (40 mg total) by mouth 2 (two) times daily. 30 tablet 3  . promethazine (PHENERGAN) 25 MG tablet Take 0.5-1 tablets (12.5-25 mg total) by mouth every 6 (six) hours as needed. (Patient not taking: Reported on 07/04/2015) 30 tablet 0   Current Facility-Administered Medications  Medication Dose Route Frequency Provider Last Rate Last Dose  . leuprolide (LUPRON) injection 11.25 mg  11.25 mg Intramuscular Once Woodroe Mode, MD         Review of Systems Review of Systems  Constitutional: Negative.   Gastrointestinal: Negative.   Genitourinary: Positive for vaginal bleeding and menstrual problem. Negative for vaginal pain.    Blood pressure 122/81, pulse 79, temperature 98.9 F (37.2 C), temperature source Oral, height 5\' 1"  (1.549 m), weight 178 lb 4.8 oz (80.876 kg), last menstrual period 05/08/2015, unknown if currently breastfeeding.  Physical Exam Physical Exam  Constitutional: She is oriented to person, place, and time. She appears well-developed. No distress.  Pulmonary/Chest: Effort normal.  Abdominal: Soft.  Genitourinary: Vaginal discharge (blood) found.  5-6 cm cervical fibroid  Neurological: She is alert and oriented to person, place, and time.  Skin: Skin is warm and dry.  Psychiatric: She has a normal mood and affect. Her behavior is normal.    Data Reviewed [place] report  Assessment    Prolapsed uterine fibroid, DUB     Plan    Megace 40 BID Lupron depot 11.25 mg RTC to schedule [facility]        Khalif Stender 07/20/2015, 3:52 PM

## 2015-08-12 ENCOUNTER — Inpatient Hospital Stay (HOSPITAL_COMMUNITY)
Admission: AD | Admit: 2015-08-12 | Discharge: 2015-08-12 | Disposition: A | Discharge status: Home / Self Care | Payer: Self-pay | Source: Ambulatory Visit | Attending: Obstetrics & Gynecology | Admitting: Obstetrics & Gynecology

## 2015-08-12 ENCOUNTER — Encounter (HOSPITAL_COMMUNITY): Payer: Self-pay | Admitting: *Deleted

## 2015-08-12 DIAGNOSIS — D259 Leiomyoma of uterus, unspecified: Secondary | ICD-10-CM

## 2015-08-12 DIAGNOSIS — N939 Abnormal uterine and vaginal bleeding, unspecified: Secondary | ICD-10-CM

## 2015-08-12 DIAGNOSIS — D500 Iron deficiency anemia secondary to blood loss (chronic): Secondary | ICD-10-CM

## 2015-08-12 DIAGNOSIS — E119 Type 2 diabetes mellitus without complications: Secondary | ICD-10-CM | POA: Insufficient documentation

## 2015-08-12 DIAGNOSIS — N9489 Other specified conditions associated with female genital organs and menstrual cycle: Secondary | ICD-10-CM

## 2015-08-12 DIAGNOSIS — Z3202 Encounter for pregnancy test, result negative: Secondary | ICD-10-CM | POA: Insufficient documentation

## 2015-08-12 LAB — CBC WITH DIFFERENTIAL/PLATELET
BASOS ABS: 0 10*3/uL (ref 0.0–0.1)
Basophils Relative: 1 %
EOS ABS: 0.2 10*3/uL (ref 0.0–0.7)
EOS PCT: 3 %
HCT: 26.3 % — ABNORMAL LOW (ref 36.0–46.0)
HEMOGLOBIN: 8.4 g/dL — AB (ref 12.0–15.0)
LYMPHS ABS: 2.7 10*3/uL (ref 0.7–4.0)
LYMPHS PCT: 36 %
MCH: 25.2 pg — AB (ref 26.0–34.0)
MCHC: 31.9 g/dL (ref 30.0–36.0)
MCV: 79 fL (ref 78.0–100.0)
Monocytes Absolute: 0.8 10*3/uL (ref 0.1–1.0)
Monocytes Relative: 11 %
NEUTROS PCT: 50 %
Neutro Abs: 3.8 10*3/uL (ref 1.7–7.7)
PLATELETS: 351 10*3/uL (ref 150–400)
RBC: 3.33 MIL/uL — AB (ref 3.87–5.11)
RDW: 14.5 % (ref 11.5–15.5)
WBC: 7.5 10*3/uL (ref 4.0–10.5)

## 2015-08-12 LAB — URINALYSIS, ROUTINE W REFLEX MICROSCOPIC
Bilirubin Urine: NEGATIVE
Glucose, UA: NEGATIVE mg/dL
Ketones, ur: NEGATIVE mg/dL
Leukocytes, UA: NEGATIVE
NITRITE: NEGATIVE
PROTEIN: NEGATIVE mg/dL
SPECIFIC GRAVITY, URINE: 1.025 (ref 1.005–1.030)
UROBILINOGEN UA: 0.2 mg/dL (ref 0.0–1.0)
pH: 6 (ref 5.0–8.0)

## 2015-08-12 LAB — URINE MICROSCOPIC-ADD ON

## 2015-08-12 LAB — POCT PREGNANCY, URINE: Preg Test, Ur: NEGATIVE

## 2015-08-12 MED ORDER — MEGESTROL ACETATE 40 MG PO TABS: 40.0000 mg | ORAL_TABLET | Freq: Three times a day (TID) | ORAL | Status: DC

## 2015-08-12 NOTE — MAU Note (Addendum)
Patient presents stating that she was pregnant but lost the pregnancy in 06/2015 with c/o heavy vaginal bleeding since 1600 today. Also c/o lower abdominal pain. Is taking Megace for bleeding but states this med is not working.

## 2015-08-12 NOTE — MAU Provider Note (Signed)
History     CSN: [id number]  Arrival date and time: 08/12/15 1817   None     Chief Complaint  Patient presents with  . Vaginal Bleeding   HPI Theresa Barry is 39 y.o. [id number] [redacted]w[redacted]d weeks presenting with heavy vaginal bleeding with clots that began at 2pm today.  Bleeding would slow up and then have a large amount with clots total of 3 times today.  She states she cannot be pregnant because she has not had intercourse since miscarriage because of the continued bleeding.  She is a patient of Dr. Jordan Hawks in the clinic.  She was seen 9/12 with vaginal bleeding in early pregnancy, again on 9/14=Blighted Ovum treated with Cytotec.  See for post op on 9/28 with labs confirming SAB.  Returned to clinic on 10/14 with continued bleeding since SAB, dx with prolapsing uterine fibroid.  Hx of prolapsed fibroid in 2014 removed by Dr. Harolyn Rutherford.  Treated with Megace. Reports she did not have Lupron injection-- completed papers for assistance for Lupron per her report and is waiting to hear back and is waiting for surgery.  She states she wants a total hysterectomy because she has 2 aunts that had ovarian cancer.       Past Medical History  Diagnosis Date  . Medical history non-contributory   . Fibroid   . Diabetes mellitus without complication ([place])   . Anemia     Past Surgical History  Procedure Laterality Date  . Ovarian cyst removal    . Hysteroscopy w/d&c N/A 05/16/2013    Procedure: DILATATION AND CURETTAGE /HYSTEROSCOPY  Removal of [name];  Surgeon: Osborne Oman, MD;  Location: [facility];  Service: Gynecology;  Laterality: N/A;  . Fibroidectomy      vaginally    History reviewed. No pertinent family history.  Social History  Substance Use Topics  . Smoking status: Never Smoker   . Smokeless tobacco: None  . Alcohol Use: No    Allergies: No Known Allergies  Facility-administered medications prior to admission  Medication Dose Route Frequency Provider Last Rate  Last Dose  . leuprolide (LUPRON) injection 11.25 mg  11.25 mg Intramuscular Once Woodroe Mode, MD       Prescriptions prior to admission  Medication Sig Dispense Refill Last Dose  . megestrol (MEGACE) 40 MG tablet Take 1 tablet (40 mg total) by mouth 2 (two) times daily. 30 tablet 3 08/12/2015 at Unknown time  . Prenatal Vit-Fe Fumarate-FA (MULTIVITAMIN-PRENATAL) 27-0.8 MG TABS tablet Take 1 tablet by mouth daily.    08/12/2015 at Unknown time  . HYDROcodone-acetaminophen (NORCO/VICODIN) 5-325 MG per tablet Take 1-2 tablets by mouth every 4 (four) hours as needed. (Patient not taking: Reported on 07/04/2015) 15 tablet 0 Not Taking  . promethazine (PHENERGAN) 25 MG tablet Take 0.5-1 tablets (12.5-25 mg total) by mouth every 6 (six) hours as needed. (Patient not taking: Reported on 07/04/2015) 30 tablet 0 Not Taking    Review of Systems  Constitutional: Negative for fever and chills.  Cardiovascular: Negative for chest pain.  Gastrointestinal: Positive for abdominal pain (mild lower abdominal cramping). Negative for nausea and vomiting.  Genitourinary: Negative for dysuria, urgency, frequency and hematuria.       Positive for vaginal bleeding.  Neurological: Positive for tingling (occasionally in her feet and fingers). Negative for dizziness, weakness and headaches.   Physical Exam   Blood pressure 108/69, pulse 91, temperature 98.2 F (36.8 C), temperature source Oral, resp. rate 18, height 5\' 2"  (1.575 m),  weight 179 lb 6 oz (81.364 kg), last menstrual period 05/08/2015, unknown if currently breastfeeding.  Physical Exam  Nursing note and vitals reviewed. Constitutional: She is oriented to person, place, and time. She appears well-developed and well-nourished. No distress.  HENT:  Head: Normocephalic.  Neck: Normal range of motion. Neck supple.  Cardiovascular: Normal rate.   Respiratory: Effort normal. No respiratory distress.  GI: Soft. There is tenderness (mild lower abdominal  discomfort on exam).  Genitourinary: There is no rash, tenderness or lesion on the right labia. There is no rash, tenderness or lesion on the left labia. Uterus is tender. Uterus is not enlarged (mild tenderness on exam). Cervix exhibits no motion tenderness, no discharge and no friability. Right adnexum displays no mass, no tenderness and no fullness. Left adnexum displays no mass, no tenderness and no fullness. There is bleeding (1 large clot without active bleeding.  Scant oozing of blood from os.  ) in the vagina. No vaginal discharge found.  There is a prolapsing fibroid bulging from the cervical os.   Musculoskeletal: Normal range of motion. She exhibits no edema.  Neurological: She is alert and oriented to person, place, and time.  Skin: Skin is warm. She is not diaphoretic.  Psychiatric: She has a normal mood and affect. Her behavior is normal.   Results for orders placed or performed during the hospital encounter of 08/12/15 (from the past 24 hour(s))  CBC with Differential/Platelet     Status: Abnormal   Collection Time: 08/12/15  6:50 PM  Result Value Ref Range   WBC 7.5 4.0 - 10.5 K/uL   RBC 3.33 (L) 3.87 - 5.11 MIL/uL   Hemoglobin 8.4 (L) 12.0 - 15.0 g/dL   HCT 26.3 (L) 36.0 - 46.0 %   MCV 79.0 78.0 - 100.0 fL   MCH 25.2 (L) 26.0 - 34.0 pg   MCHC 31.9 30.0 - 36.0 g/dL   RDW 14.5 11.5 - 15.5 %   Platelets 351 150 - 400 K/uL   Neutrophils Relative % 50 %   Neutro Abs 3.8 1.7 - 7.7 K/uL   Lymphocytes Relative 36 %   Lymphs Abs 2.7 0.7 - 4.0 K/uL   Monocytes Relative 11 %   Monocytes Absolute 0.8 0.1 - 1.0 K/uL   Eosinophils Relative 3 %   Eosinophils Absolute 0.2 0.0 - 0.7 K/uL   Basophils Relative 1 %   Basophils Absolute 0.0 0.0 - 0.1 K/uL  Urinalysis, Routine w reflex microscopic (not at [facility])     Status: Abnormal   Collection Time: 08/12/15  7:39 PM  Result Value Ref Range   Color, Urine YELLOW YELLOW   APPearance CLEAR CLEAR   Specific Gravity, Urine 1.025 1.005 -  1.030   pH 6.0 5.0 - 8.0   Glucose, UA NEGATIVE NEGATIVE mg/dL   Hgb urine dipstick LARGE (A) NEGATIVE   Bilirubin Urine NEGATIVE NEGATIVE   Ketones, ur NEGATIVE NEGATIVE mg/dL   Protein, ur NEGATIVE NEGATIVE mg/dL   Urobilinogen, UA 0.2 0.0 - 1.0 mg/dL   Nitrite NEGATIVE NEGATIVE   Leukocytes, UA NEGATIVE NEGATIVE  Urine microscopic-add on     Status: None   Collection Time: 08/12/15  7:39 PM  Result Value Ref Range   Squamous Epithelial / LPF RARE RARE   RBC / HPF TOO NUMEROUS TO COUNT <3 RBC/hpf  Pregnancy, urine POC     Status: None   Collection Time: 08/12/15  7:48 PM  Result Value Ref Range   Preg Test, Ur NEGATIVE  NEGATIVE    HBG 9/28 11.0 BLOOD TYPE A Positive  MAU Course  Procedures  MDM  MSE Exam Labs Consulted with Dr. Kristeen Mans MSE, exam, notes from Dr. Roselie Awkward and HBG.  She is stable, laughing, smiling and is not acute at this time.  Order given to increase Megace to 40mg  tid, follow up phone call to clinic tomorrow to check on Lupron.   Assessment and Plan  A:  Vaginal bleeding      Prolapsed uterine fibroid      Anemia due to chronic blood loss      Negative pregnancy test  P:  Instructed patient to increase Megace to 40mg  tid      Add an additional OTC Fe--1 tab to prenatal vitamin daily      Call clinic in the am to check on Lupron and surgery date status      Return for worsening sxs         KEY,EVE M 08/12/2015, 7:59 PM

## 2015-08-12 NOTE — Discharge Instructions (Signed)
Sangrado uterino anormal (Abnormal Uterine Bleeding) El sangrado uterino anormal puede afectar a las mujeres que estn en diversas etapas de la vida, desde adolescentes, mujeres frtiles y Games developer, hasta mujeres que han llegado a la menopausia. Hay diversas clases de sangrado uterino que se consideran anormales, entre ellas:  Prdidas de sangre o International [employer] perodos.  Hemorragias luego de Retail banker.  Sangrado abundante o ms que lo habitual.  Perodos que duran ms que lo normal.  Sangrado luego de la menopausia. Muchos casos de sangrado uterino anormal son leves y simples de tratar, mientras que otros son ms graves. El mdico debe evaluar cualquier clase de sangrado anormal. El tratamiento depender de la causa del sangrado. INSTRUCCIONES PARA EL CUIDADO EN EL HOGAR Controle su afeccin para ver si hay cambios. Las siguientes indicaciones ayudarn a Chief Strategy Officer que pueda sentir:  Evite las duchas vaginales y el uso de tampones segn las indicaciones del mdico.  [place] compresas con frecuencia. Deber hacerse exmenes plvicos regulares y pruebas de [place]. Cumpla con todas las visitas de control y [employer] diagnsticos, segn le indique su mdico.  SOLICITE ATENCIN MDICA SI:   El sangrado dura ms de 1 semana.  Se siente mareada por momentos. SOLICITE ATENCIN MDICA DE INMEDIATO SI:   Se desmaya.  Debe cambiarse la compresa cada 15 a 30 minutos.  Siente dolor abdominal.  Theresa Barry.  Se siente dbil o presenta sudoracin.  Elimina cogulos grandes por la vagina.  Comienza a sentir nuseas y [place]. ASEGRESE DE QUE:   Comprende estas instrucciones.  Controlar su afeccin.  Recibir ayuda de inmediato si no mejora o si empeora.   Esta informacin no tiene Marine scientist el consejo del mdico. Asegrese de hacerle al mdico cualquier pregunta que tenga.   Document Released: 09/22/2005  Document Revised: 09/27/2013 Elsevier Interactive Patient Education 2016 [place]. Anemia inespecfica (Anemia, Nonspecific) La anemia es una enfermedad en la que la concentracin de glbulos rojos o el nivel de hemoglobina en la sangre estn por debajo de lo normal. La hemoglobina es la sustancia de los glbulos rojos que lleva el oxgeno a todo el cuerpo. La anemia da como resultado que los tejidos no reciban la cantidad suficiente de oxgeno.  CAUSAS  Las causas ms frecuentes de anemia son:   Elvina Mattes. El sangrado puede ser interno o externo. Incluye sangrado excesivo debido al perodo (en las mujeres) o por los intestinos.   Dficit nutricional.   Enfermedad renal, tiroidea o heptica crnicas.  Enfermedades de la mdula sea que disminuyen la produccin de glbulos rojos.  Cncer y tratamientos para Science writer.  VIH, sida y sus tratamientos.  Trastornos del bazo que aumentan la destruccin de glbulos rojos.  Enfermedades de [employer].  Destruccin excesiva de glbulos rojos debido a una infeccin, a medicamentos y a Nurse, mental health. SIGNOS Y SNTOMAS   Debilidad leve.   Mareos.   Dolor de [place].  Palpitaciones.   Falta de aire, especialmente con el ejercicio.   Palidez.  Sensibilidad al fro.  Indigestin.  Nuseas.  Dificultad para dormir.  Dificultad para concentrarse. Los sntomas pueden ocurrir repentinamente o pueden Psychologist, forensic.  DIAGNSTICO  Con frecuencia es necesario realizar anlisis de [employer]. Estos ayudan al profesional a Adult nurse. Su mdico controlar la materia fecal para Hydrographic surveyor la presencia de [place] y buscar otras causas de prdida de [place].  TRATAMIENTO  El tratamiento vara segn la causa de la anemia. Las opciones de  tratamiento son:   Suplementos de hierro, vitamina I43, o cido flico.   Medicamentos con hormonas.   Transfusin de [place]. Ser  necesaria en los casos de prdida de [place] grave.   Hospitalizacin. Ser necesaria si la prdida de sangre es continua y significativa.   Cambios en la dieta.  Extirpacin del bazo. INSTRUCCIONES PARA EL CUIDADO EN EL HOGAR Cumpla con todas las visitas de control. Generalmente demora varias semanas corregir la anemia, y es muy importante que el mdico controle su enfermedad y su respuesta al [place]. SOLICITE ATENCIN MDICA DE INMEDIATO SI:   Siente debilidad extrema, falta de aire o dolor en el pecho.   Se siente mareado o tiene dificultad para concentrarse.  Tiene una hemorragia vaginal abundante.   Aparece una erupcin cutnea.   La materia fecal es negra, de aspecto alquitranado.   Se desmaya.   Vomita sangre.   Vomita repetidas veces.   Siente dolor abdominal.  Tiene fiebre o sntomas persistentes durante ms de 2 - 3 das.   Tiene fiebre y los sntomas empeoran repentinamente.   Se deshidrata.  ASEGRESE DE QUE:  Comprende estas instrucciones.  Controlar su afeccin.  Recibir ayuda de inmediato si no mejora o si empeora.   Esta informacin no tiene Marine scientist el consejo del mdico. Asegrese de hacerle al mdico cualquier pregunta que tenga.   Document Released: 09/22/2005 Document Revised: 05/25/2013 Elsevier Interactive Patient Education 2016 [place] uterinos (Uterine Fibroids) Los fibromas uterinos son masas (tumores) de tejido. Tambin se los [employer]. Pueden desarrollarse dentro del abdomen de Musician (tero) y crecer hasta volverse muy grandes. Los fibromas no son cancerosos (benignos). La mayora no requiere tratamiento mdico. CUIDADOS EN EL HOGAR  Concurra a todas las visitas de control como se lo haya indicado el mdico. Esto es importante.  Tome los medicamentos solamente como se lo haya indicado el mdico.  Si le recetaron un tratamiento hormonal, tome los medicamentos hormonales  exactamente como se lo indicaron.  No tome aspirina. Puede ocasionar hemorragias.  Consulte al [employer] sobre tomar comprimidos de hierro y Garment/textile technologist la cantidad de verduras de hoja color verde oscuro en la dieta. Estas medidas pueden ayudar a incrementar los niveles de Tax adviser.  Preste mucha atencin a Hydrographic surveyor. Informe al mdico si hay algn cambio, por ejemplo:  Aumento del flujo de [place]. Esto puede exigirle el uso de ms compresas o tampones que los que [place] normalmente cada mes.  Un cambio en la cantidad de [employer] dura la menstruacin cada mes.  Un cambio en los sntomas que se manifiestan con la menstruacin, como dolor de espalda o clicos en la zona del vientre (abdomen). SOLICITE AYUDA SI:  Siente dolor de espalda o en la zona que se encuentra entre los huesos de la cadera (zona plvica) que los medicamentos no Transport planner.  Siente dolor en el abdomen que los medicamentos no Transport planner.  Observa un aumento del sangrado entre y [employer].  Empapa los tampones o las compresas en el trmino de media hora o [place].  Se siente mareada.  Se siente muy cansado.  Se siente dbil. SOLICITE AYUDA DE INMEDIATO SI:   Pierde el conocimiento (se desmaya).  El dolor plvico aumenta repentinamente.   Esta informacin no tiene Marine scientist el consejo del mdico. Asegrese de hacerle al mdico cualquier pregunta que tenga.   Document Released: 01/07/2011 Document Revised: 10/13/2014 Elsevier Interactive Patient Education 2016 Elsevier  Inc. ° °

## 2015-08-14 NOTE — Progress Notes (Unsigned)
Received a fax from [place], Lupron Application, concerning pt's SSN # does not meet format.   Spoke with Lennette Bihari, Lupron Application respresentative, and informed him that pt does not have a SSN# and that pt's name needed to be corrected on Lupron file.  Lennette Bihari corrected pt's name and informed me that Depo Lupron 11.25 mg has been approved for the pt and will be sent to the Clinics in 3-4 days.  [facility] called pt and informed her that we will call her with an appt once the office have received the medication.

## 2015-08-20 ENCOUNTER — Ambulatory Visit: Payer: No Typology Code available for payment source

## 2015-08-20 ENCOUNTER — Encounter: Payer: Self-pay | Admitting: *Deleted

## 2015-08-20 ENCOUNTER — Ambulatory Visit (INDEPENDENT_AMBULATORY_CARE_PROVIDER_SITE_OTHER): Payer: Self-pay | Admitting: *Deleted

## 2015-08-20 VITALS — BP 122/54 | HR 79 | Temp 98.5°F | Wt 181.0 lb

## 2015-08-20 DIAGNOSIS — Z79818 Long term (current) use of other agents affecting estrogen receptors and estrogen levels: Secondary | ICD-10-CM

## 2015-09-06 ENCOUNTER — Other Ambulatory Visit: Payer: Self-pay

## 2015-09-06 DIAGNOSIS — Z1231 Encounter for screening mammogram for malignant neoplasm of breast: Secondary | ICD-10-CM

## 2015-09-27 ENCOUNTER — Ambulatory Visit
Admission: RE | Admit: 2015-09-27 | Discharge: 2015-09-27 | Disposition: A | Discharge status: Home / Self Care | Payer: No Typology Code available for payment source | Source: Ambulatory Visit

## 2015-09-27 DIAGNOSIS — Z1231 Encounter for screening mammogram for malignant neoplasm of breast: Secondary | ICD-10-CM

## 2015-11-01 ENCOUNTER — Encounter: Payer: Self-pay | Admitting: Obstetrics & Gynecology

## 2015-11-01 ENCOUNTER — Ambulatory Visit (INDEPENDENT_AMBULATORY_CARE_PROVIDER_SITE_OTHER): Payer: Self-pay | Admitting: Obstetrics & Gynecology

## 2015-11-01 VITALS — BP 122/87 | HR 99 | Temp 98.4°F | Wt 183.1 lb

## 2015-11-01 DIAGNOSIS — D259 Leiomyoma of uterus, unspecified: Secondary | ICD-10-CM

## 2015-11-01 LAB — CBC
HEMATOCRIT: 32.5 % — AB (ref 36.0–46.0)
HEMOGLOBIN: 9.5 g/dL — AB (ref 12.0–15.0)
MCH: 22.1 pg — ABNORMAL LOW (ref 26.0–34.0)
MCHC: 29.2 g/dL — AB (ref 30.0–36.0)
MCV: 75.8 fL — ABNORMAL LOW (ref 78.0–100.0)
MPV: 10.8 fL (ref 8.6–12.4)
Platelets: 402 10*3/uL — ABNORMAL HIGH (ref 150–400)
RBC: 4.29 MIL/uL (ref 3.87–5.11)
RDW: 19.7 % — ABNORMAL HIGH (ref 11.5–15.5)
WBC: 6.6 10*3/uL (ref 4.0–10.5)

## 2015-11-01 MED ORDER — RANITIDINE HCL 150 MG PO TABS: 150.0000 mg | ORAL_TABLET | Freq: Two times a day (BID) | ORAL | Status: DC

## 2015-11-01 NOTE — Progress Notes (Signed)
Subjective: CC: discuss TVH and cough  HPI: Patient is a 40 y.o. female with a past medical history of abnormal uterine bleeding with fibroids and anemia presenting to clinic today to discuss a total vaginal hysterectomy with complaints of dry cough. Utilized Spanish interpreter for this encounter.  Dry cough: present for the last month. Patient feels it has been getting better as it has decreased in frequency. Notes she'll be completely normal then suddenly have dry, forceful cough. Cough is not related to activities such as eating. Notes it sometimes occurs more in the AM and at night, but can come on at any time. She denies any fevers, chills, nasal congestion, rhinorrhea, watery eyes, otalgias, SOB, or chest pain. Notes that sometimes she has a sensation of "air" centrally located in her chest that can't get up. Denies any foul/acidic taste or burning sensation in throat. Only taking vitamins, iron supplement, and Megace. Never smoker.   Vaginal bleeding: continues to have vaginal bleeding. Uses approximately 2 pads per day. Notes this is improved from December when she was bleeding significantly more. Some tiredness but stable. No SOF or chest pain. Concerned about cancer.   Social History: never smoker   ROS: All other systems reviewed and are negative except that noted in HPI and bilateral knee pain.  Past Medical History Patient Active Problem List   Diagnosis Date Noted  . Menorrhagia with regular cycle 04/19/2015  . 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 Bleeding from cervical fibroid 05/16/2013  . LIPOMA 08/16/2007  . INSOMNIA UNSPECIFIED 08/16/2007  . COUGH 08/16/2007    Medications- reviewed and updated Current Outpatient Prescriptions  Medication Sig Dispense Refill  . ferrous sulfate 325 (65 FE) MG tablet Take 325 mg by mouth 2 (two) times daily with a meal.    . ibuprofen (ADVIL,MOTRIN) 800 MG tablet Take 800 mg by mouth every 8 (eight) hours as needed for mild  pain or moderate pain.    . megestrol (MEGACE) 40 MG tablet Take 1 tablet (40 mg total) by mouth 3 (three) times daily. 90 tablet 0  . Prenatal Vit-Fe Fumarate-FA (MULTIVITAMIN-PRENATAL) 27-0.8 MG TABS tablet Take 1 tablet by mouth daily.     Marland Kitchen HYDROcodone-acetaminophen (NORCO/VICODIN) 5-325 MG per tablet Take 1-2 tablets by mouth every 4 (four) hours as needed. (Patient not taking: Reported on 07/04/2015) 15 tablet 0   No current facility-administered medications for this visit.  CC: less vaginal bleeding and wants to schedule surgery  Objective: Office vital signs reviewed. BP 122/87 mmHg  Pulse 99  Temp(Src) 98.4 F (36.9 C) (Oral)  Wt 183 lb 1.6 oz (83.054 kg)  LMP  (LMP Unknown)   Physical Examination:  General: Awake, alert, well- nourished, NAD ENMT: Nasal turbinates moist. MMM, Oropharynx clear without erythema or tonsillar exudate/hypertrophy Eyes: Conjunctiva non-injected.  Cardio: RRR, no m/r/g noted. No pitting edema.  Pulm: No increased WOB.  CTAB, without wheezes, rhonchi or crackles noted.  GI: soft, non-distended and non-tender.  10/12/15: Hgb 8.8  From PCP  Assessment/Plan: 40 y/o female presenting with abnormal uterine bleeding with benign endometrial biopsy and dry cough.  Dry cough: DDx includes infectious (no other symptoms and lungs clear and afebrile), allergic given AM and PM distribution (no other associated symptoms), medication induced (med rec completed without any noticeable etiology), or acid reflux. Given she has sensation of trapped air, will attempt to treat for GERD initially given this is a dry cough and she is on ferrous sulfate which can cause GERD.  Non-toxic on exam, lungs clear, and afebrile. - Rx Zantac 150mg  daily  - discussed if symptoms do no improve, return to PCP for further evaluation.   Abnormal uterine bleeding with fibroid: Consent for TVH obtained today by Dr. Roselie Awkward. 40 y.o. [id number]  Risks and benefits of procedure discussed with  patient, bleeding,transfusion,infection, injury to surrounding organs and need for additional procedures. Patient verbalized understanding and all questions were answered.  Alvina Filbert Roselie Awkward MD 11/01/2015 4:43 PM    - Labs from PCP to be scanned into system  - given decrease in bleeding frequency I suspect hgb has improved. - CBC today - continue ferrous sulfate  Archie Patten, MD PGY-2, [facility] Attestation of Attending Supervision of Family Practice Resident: Evaluation and management procedures were performed by the resident under my supervision and collaboration.  I have reviewed the resident's note and chart, and I agree with the management and plan.  Emeterio Reeve, MD, [place] Attending [place], [facility]

## 2015-11-01 NOTE — Patient Instructions (Signed)
Histerectoma vaginal asistida por laparoscopa  (Laparoscopically Assisted Vaginal Hysterectomy) La histerectoma vaginal asistida por laparoscopa (HVAL) es un procedimiento quirrgico para extirpar el tero y el cuello uterino, y en algunos casos los ovarios y las trompas de [place]. Durante la HVAL, cierta parte de la remocin [place] se realiza a travs de la vagina, y el resto a travs de pequeos cortes quirrgicos (incisiones) en el abdomen.  Este procedimiento generalmente se indica en mujeres en las que la histerectoma vaginal no es una opcin. El mdico Delphi riesgos y beneficios de los diferentes tcnicas quirrgicas cuando concurra a la visita. Generalmente el tiempo de recuperacin es rpido y hay menos complicaciones luego de los procedimientos laparoscpicos que en los procedimientos de [place]. INFORME A SU MDICO:   Cualquier alergia que tenga.  Todos los [employer] [place], incluyendo vitaminas, hierbas, gotas oftlmicas, cremas y medicamentos de venta libre.  Problemas previos que usted o los [employer]Health de su familia hayan tenido con el uso de anestsicos.  Enfermedades de [employer].  Cirugas previas.  Padecimientos mdicos. RIESGOS Y COMPLICACIONES Generalmente es un procedimiento seguro. Sin embargo, Games developer procedimiento, pueden surgir complicaciones. Las complicaciones posibles son:  Risk analyst a los medicamentos.  Dificultad para respirar.  Hemorragias.  Infeccin.  Dao a otras estructuras cercanas al tero y al cuello. ANTES DEL PROCEDIMIENTO  Consulte a su mdico si debe cambiar o suspender los medicamentos que toma habitualmente.  [employer], como los que necesita para prepararse vaciando el colon, segn las indicaciones.  No coma ni beba nada durante al menos 8 horas antes del procedimiento.  Si fuma, abandone el hbito. Si deja de fumar mejorar su salud despus de la [place].  [place] a alguien  que la lleve a su casa despus de la ciruga y la ayude en casa mientras se recupera. PROCEDIMIENTO   Le colocarn una va intravenosa (IV) en una vena para administrarle lquidos y medicamentos.  Recibir medicamentos para relajarse y otros que la harn dormir (anestesia general).  Le colocarn un tubo flexible (catter) en la vejiga para drenar la orina.  Tambin insertarn un tubo a travs de la nariz o la boca hacia el estmago (sonda nasogstrica). La sonda nasogstrica drena los jugos digestivos e impide que sienta nuseas o tenga vmitos.  Le colocarn medias ajustadas (compresin) en las piernas para Product manager.  Le practicarn tres o cuatro incisiones pequeas en el abdomen. Tambin le harn una incisin en la vagina. Le insertarn unas sondas e instrumentos a travs de las pequeas incisiones. Extirparn el tero y el cuello uterino (y posiblemente los ovarios y las trompas de [place]) a travs de la vagina, as como a travs de las pequeas incisiones que se hicieron en el abdomen.  Luego la vagina se sutura a su estado normal. DESPUS DEL PROCEDIMIENTO  Posiblemente deba seguir una dieta lquida por un tiempo. Lo ms probable es volver a la dieta habitual y Engineer, structural bien al da siguiente de la [place].  Tendr que orinar a travs de un catter. Este se Teacher, English as a foreign language despus de la [place].  Le harn controles regulares de la temperatura, frecuencia cardaca y respiratoria, presin arterial y nivel de oxgeno.  Seguir Lennar Corporation de compresin en las piernas hasta que pueda moverse.  Usar un dispositivo especial o har ejercicios de respiracin para mantener los pulmones limpios.  La alentarn a caminar lo ms pronto posible.   Esta informacin no tiene Marine scientist el consejo del mdico.  Asegrese de hacerle al mdico cualquier pregunta que tenga.   Document Released: 09/11/2011 Document Revised: 10/13/2014 Elsevier Interactive Patient Education  [employer].

## 2015-11-02 ENCOUNTER — Telehealth: Payer: Self-pay | Admitting: *Deleted

## 2015-11-02 NOTE — Telephone Encounter (Signed)
Called pt with interpreter Theresa Barry. Pt was advised that her hemoglobin level is improved and she should continue taking iron as prescribed. Pt agreed and voiced understanding.

## 2015-11-06 ENCOUNTER — Encounter: Payer: Self-pay | Admitting: *Deleted

## 2015-11-07 ENCOUNTER — Encounter (HOSPITAL_COMMUNITY): Payer: Self-pay | Admitting: *Deleted

## 2015-11-16 ENCOUNTER — Other Ambulatory Visit: Payer: Self-pay | Admitting: Obstetrics & Gynecology

## 2015-12-10 NOTE — Patient Instructions (Addendum)
Instrucciones:  Su cirugia esta programada para-( your procedure is scheduled on) :  Tuesday, December 18, 2015  Entre por la entrada principal a la(s) -(enter through the main entrance at):  7:30 AM  [place] e informenos de su llegada ( pick up phone, dial [phone] on arrival)  Por favor llame al [phone] si tiene algun problema la [place] ( please call [phone] if you have any problems the morning of surgery.)  Recuerde: (Remember)  No coma alimentos ni tome liquidos, incluyendo agua, despues de la medianoche del  ( Do not eat food or drink liquids including water after midnight on Monday  Tome estas medicinas la manana de la cirugia con un sorbito de agua (take these meds the morning of surgery with a SIP of water) Zantac  Puede cepillarse los dientes en la manana de la [place]. (you may brush your teeth the morning of surgery)  NO use joyas, maquillaje de ojos, lapiz labial, crema para el cuerpo o esmalte de unas oscuro - las unas de los pies pueden estar pintados. ( Do not wear jewelry, eye makeup, lipstick, body lotion, or dark fingernail polish)  Puede usar desodorante ( you may wear deodorant)  Si va a ser ingresado despues de las [place], deje la [place] en el carro hasta que se le haya asignado una habitacion. ( If you are to be admitted after surgery, leave suitcase in car until your room has been assigned.)  A los pacientes que se les de de alta el mismo dia no se les permitira manejar a casa.  ( Patients discharged on the day of surgery will not be allowed to drive home)  Use ropa suelta y comoda de regreso a Manufacturing engineer. ( wear loose comfortable clothes for ride home)  Firma del paciente (patient signature) ______________________________________     Your procedure is scheduled on:  Tuesday, December 18, 2015  Enter through the [employer] [facility] at:  7:30 AM  Pick up the phone at the desk and dial [phone].  Call this  number if you have problems the morning of surgery: [phone].  Remember:  Do NOT eat food or drink after:  Midnight Monday  Take these medicines the morning of surgery with a SIP OF WATER:  Zantac  Do NOT wear jewelry (body piercing), metal hair clips/bobby pins, make-up, or nail polish. Do NOT wear lotions, powders, or perfumes.  You may wear deoderant. Do NOT shave for 48 hours prior to surgery. Do NOT bring valuables to the hospital. Contacts, dentures, or bridgework may not be worn into surgery.  Leave suitcase in car.  After surgery it may be brought to your room.  For patients admitted to the hospital, checkout time is 11:00 AM the day of discharge.

## 2015-12-11 ENCOUNTER — Encounter (HOSPITAL_COMMUNITY): Payer: Self-pay

## 2015-12-11 ENCOUNTER — Encounter (HOSPITAL_COMMUNITY)
Admission: RE | Admit: 2015-12-11 | Discharge: 2015-12-11 | Disposition: A | Discharge status: Home / Self Care | Payer: No Typology Code available for payment source | Source: Ambulatory Visit | Attending: Obstetrics & Gynecology | Admitting: Obstetrics & Gynecology

## 2015-12-11 DIAGNOSIS — Z01812 Encounter for preprocedural laboratory examination: Secondary | ICD-10-CM | POA: Insufficient documentation

## 2015-12-11 LAB — CBC
HEMATOCRIT: 36.8 % (ref 36.0–46.0)
Hemoglobin: 11.8 g/dL — ABNORMAL LOW (ref 12.0–15.0)
MCH: 24.6 pg — AB (ref 26.0–34.0)
MCHC: 32.1 g/dL (ref 30.0–36.0)
MCV: 76.7 fL — AB (ref 78.0–100.0)
PLATELETS: 310 10*3/uL (ref 150–400)
RBC: 4.8 MIL/uL (ref 3.87–5.11)
RDW: 20.4 % — ABNORMAL HIGH (ref 11.5–15.5)
WBC: 5.6 10*3/uL (ref 4.0–10.5)

## 2015-12-11 LAB — BASIC METABOLIC PANEL
Anion gap: 6 (ref 5–15)
BUN: 11 mg/dL (ref 6–20)
CHLORIDE: 109 mmol/L (ref 101–111)
CO2: 23 mmol/L (ref 22–32)
Calcium: 9 mg/dL (ref 8.9–10.3)
Creatinine, Ser: 0.62 mg/dL (ref 0.44–1.00)
GFR calc non Af Amer: >60 mL/min (ref >60)
Glucose, Bld: 125 mg/dL — ABNORMAL HIGH (ref 65–99)
POTASSIUM: 3.6 mmol/L (ref 3.5–5.1)
SODIUM: 138 mmol/L (ref 135–145)

## 2015-12-11 LAB — TYPE AND SCREEN
ABO/RH(D): A POS
Antibody Screen: NEGATIVE

## 2015-12-18 ENCOUNTER — Ambulatory Visit (HOSPITAL_COMMUNITY): Payer: Self-pay | Admitting: Anesthesiology

## 2015-12-18 ENCOUNTER — Encounter (HOSPITAL_COMMUNITY): Payer: Self-pay | Admitting: *Deleted

## 2015-12-18 ENCOUNTER — Encounter (HOSPITAL_COMMUNITY)
Admission: RE | Disposition: A | Discharge status: Home / Self Care | Payer: Self-pay | Source: Ambulatory Visit | Attending: Obstetrics & Gynecology

## 2015-12-18 ENCOUNTER — Ambulatory Visit (HOSPITAL_COMMUNITY)
Admission: RE | Admit: 2015-12-18 | Discharge: 2015-12-19 | Disposition: A | Discharge status: Home / Self Care | Payer: Self-pay | Source: Ambulatory Visit | Attending: Obstetrics & Gynecology | Admitting: Obstetrics & Gynecology

## 2015-12-18 DIAGNOSIS — N938 Other specified abnormal uterine and vaginal bleeding: Secondary | ICD-10-CM | POA: Insufficient documentation

## 2015-12-18 DIAGNOSIS — E119 Type 2 diabetes mellitus without complications: Secondary | ICD-10-CM | POA: Insufficient documentation

## 2015-12-18 DIAGNOSIS — IMO0001 Reserved for inherently not codable concepts without codable children: Secondary | ICD-10-CM | POA: Diagnosis present

## 2015-12-18 DIAGNOSIS — N800 Endometriosis of uterus: Secondary | ICD-10-CM | POA: Insufficient documentation

## 2015-12-18 DIAGNOSIS — D649 Anemia, unspecified: Secondary | ICD-10-CM | POA: Insufficient documentation

## 2015-12-18 DIAGNOSIS — N920 Excessive and frequent menstruation with regular cycle: Secondary | ICD-10-CM | POA: Insufficient documentation

## 2015-12-18 DIAGNOSIS — D252 Subserosal leiomyoma of uterus: Secondary | ICD-10-CM | POA: Insufficient documentation

## 2015-12-18 DIAGNOSIS — D259 Leiomyoma of uterus, unspecified: Secondary | ICD-10-CM | POA: Diagnosis present

## 2015-12-18 DIAGNOSIS — D251 Intramural leiomyoma of uterus: Secondary | ICD-10-CM | POA: Insufficient documentation

## 2015-12-18 DIAGNOSIS — N840 Polyp of corpus uteri: Secondary | ICD-10-CM | POA: Insufficient documentation

## 2015-12-18 HISTORY — PX: BILATERAL SALPINGECTOMY: SHX5743

## 2015-12-18 HISTORY — PX: VAGINAL HYSTERECTOMY: SHX2639

## 2015-12-18 LAB — GLUCOSE, CAPILLARY
Glucose-Capillary: 99 mg/dL (ref 65–99)
Glucose-Capillary: 94 mg/dL (ref 65–99)

## 2015-12-18 LAB — HCG, SERUM, QUALITATIVE: Preg, Serum: NEGATIVE

## 2015-12-18 SURGERY — HYSTERECTOMY, VAGINAL
Anesthesia: Spinal | Site: Vagina

## 2015-12-18 MED ORDER — KETOROLAC TROMETHAMINE 30 MG/ML IJ SOLN
30.0000 mg | Freq: Four times a day (QID) | INTRAMUSCULAR | Status: DC
  Administered 2015-12-18 – 2015-12-19 (×3): 30 mg via INTRAVENOUS
  Filled 2015-12-18 (×3): qty 1

## 2015-12-18 MED ORDER — DIPHENHYDRAMINE HCL 50 MG/ML IJ SOLN: 12.5000 mg | INTRAMUSCULAR | Status: DC | PRN

## 2015-12-18 MED ORDER — NALBUPHINE HCL 10 MG/ML IJ SOLN: 5.0000 mg | INTRAMUSCULAR | Status: DC | PRN

## 2015-12-18 MED ORDER — PROPOFOL 500 MG/50ML IV EMUL
INTRAVENOUS | Status: DC | PRN
  Administered 2015-12-18: 150 ug/kg/min via INTRAVENOUS

## 2015-12-18 MED ORDER — FENTANYL CITRATE (PF) 100 MCG/2ML IJ SOLN
INTRAMUSCULAR | Status: DC | PRN
  Administered 2015-12-18: 20 ug via INTRATHECAL

## 2015-12-18 MED ORDER — ONDANSETRON HCL 4 MG/2ML IJ SOLN: 4.0000 mg | Freq: Four times a day (QID) | INTRAMUSCULAR | Status: DC | PRN

## 2015-12-18 MED ORDER — LIDOCAINE-EPINEPHRINE (PF) 1 %-1:200000 IJ SOLN
INTRAMUSCULAR | Status: AC
  Filled 2015-12-18: qty 30

## 2015-12-18 MED ORDER — NALOXONE HCL 2 MG/2ML IJ SOSY
1.0000 ug/kg/h | PREFILLED_SYRINGE | INTRAVENOUS | Status: DC | PRN
  Filled 2015-12-18: qty 2

## 2015-12-18 MED ORDER — LACTATED RINGERS IV SOLN
INTRAVENOUS | Status: DC
  Administered 2015-12-18: 08:00:00 via INTRAVENOUS
  Administered 2015-12-18: 125 mL/h via INTRAVENOUS
  Administered 2015-12-18: 10:00:00 via INTRAVENOUS

## 2015-12-18 MED ORDER — DIPHENHYDRAMINE HCL 25 MG PO CAPS: 25.0000 mg | ORAL_CAPSULE | ORAL | Status: DC | PRN

## 2015-12-18 MED ORDER — INFLUENZA VAC SPLIT QUAD 0.5 ML IM SUSY
0.5000 mL | PREFILLED_SYRINGE | INTRAMUSCULAR | Status: AC
  Administered 2015-12-19: 0.5 mL via INTRAMUSCULAR

## 2015-12-18 MED ORDER — DEXAMETHASONE SODIUM PHOSPHATE 10 MG/ML IJ SOLN
INTRAMUSCULAR | Status: DC | PRN
  Administered 2015-12-18: 10 mg via INTRAVENOUS

## 2015-12-18 MED ORDER — SODIUM CHLORIDE 0.9% FLUSH: 3.0000 mL | INTRAVENOUS | Status: DC | PRN

## 2015-12-18 MED ORDER — MIDAZOLAM HCL 5 MG/5ML IJ SOLN
INTRAMUSCULAR | Status: DC | PRN
  Administered 2015-12-18: 2 mg via INTRAVENOUS

## 2015-12-18 MED ORDER — PROPOFOL 500 MG/50ML IV EMUL
INTRAVENOUS | Status: AC
  Filled 2015-12-18: qty 50

## 2015-12-18 MED ORDER — NALBUPHINE HCL 10 MG/ML IJ SOLN: 5.0000 mg | Freq: Once | INTRAMUSCULAR | Status: DC | PRN

## 2015-12-18 MED ORDER — ONDANSETRON HCL 4 MG/2ML IJ SOLN
INTRAMUSCULAR | Status: AC
  Filled 2015-12-18: qty 2

## 2015-12-18 MED ORDER — NALOXONE HCL 0.4 MG/ML IJ SOLN: 0.4000 mg | INTRAMUSCULAR | Status: DC | PRN

## 2015-12-18 MED ORDER — MORPHINE SULFATE (PF) 0.5 MG/ML IJ SOLN
INTRAMUSCULAR | Status: DC | PRN
  Administered 2015-12-18: .2 mg via INTRATHECAL

## 2015-12-18 MED ORDER — EPHEDRINE SULFATE 50 MG/ML IJ SOLN
INTRAMUSCULAR | Status: DC | PRN
  Administered 2015-12-18 (×6): 5 mg via INTRAVENOUS

## 2015-12-18 MED ORDER — BUPIVACAINE IN DEXTROSE 0.75-8.25 % IT SOLN
INTRATHECAL | Status: DC | PRN
  Administered 2015-12-18: 12 mg via INTRATHECAL

## 2015-12-18 MED ORDER — CEFAZOLIN SODIUM-DEXTROSE 2-3 GM-% IV SOLR
INTRAVENOUS | Status: AC
  Filled 2015-12-18: qty 50

## 2015-12-18 MED ORDER — KETOROLAC TROMETHAMINE 30 MG/ML IJ SOLN: 30.0000 mg | Freq: Four times a day (QID) | INTRAMUSCULAR | Status: DC

## 2015-12-18 MED ORDER — DEXAMETHASONE SODIUM PHOSPHATE 10 MG/ML IJ SOLN
INTRAMUSCULAR | Status: AC
  Filled 2015-12-18: qty 1

## 2015-12-18 MED ORDER — MORPHINE SULFATE (PF) 0.5 MG/ML IJ SOLN
INTRAMUSCULAR | Status: AC
Start: 2015-12-18 — End: 2015-12-18
  Filled 2015-12-18: qty 10

## 2015-12-18 MED ORDER — LIDOCAINE HCL (CARDIAC) 20 MG/ML IV SOLN
INTRAVENOUS | Status: AC
  Filled 2015-12-18: qty 5

## 2015-12-18 MED ORDER — CEFAZOLIN SODIUM-DEXTROSE 2-3 GM-% IV SOLR
2.0000 g | INTRAVENOUS | Status: AC
  Administered 2015-12-18: 2 g via INTRAVENOUS

## 2015-12-18 MED ORDER — ONDANSETRON HCL 4 MG/2ML IJ SOLN
INTRAMUSCULAR | Status: DC | PRN
  Administered 2015-12-18: 4 mg via INTRAVENOUS

## 2015-12-18 MED ORDER — ONDANSETRON HCL 4 MG PO TABS: 4.0000 mg | ORAL_TABLET | Freq: Four times a day (QID) | ORAL | Status: DC | PRN

## 2015-12-18 MED ORDER — MEPERIDINE HCL 25 MG/ML IJ SOLN: 6.2500 mg | INTRAMUSCULAR | Status: DC | PRN

## 2015-12-18 MED ORDER — METOCLOPRAMIDE HCL 5 MG/ML IJ SOLN: 10.0000 mg | Freq: Once | INTRAMUSCULAR | Status: DC | PRN

## 2015-12-18 MED ORDER — FENTANYL CITRATE (PF) 250 MCG/5ML IJ SOLN
INTRAMUSCULAR | Status: AC
Start: 2015-12-18 — End: 2015-12-18
  Filled 2015-12-18: qty 5

## 2015-12-18 MED ORDER — HYDROMORPHONE HCL 1 MG/ML IJ SOLN: 1.0000 mg | INTRAMUSCULAR | Status: DC | PRN

## 2015-12-18 MED ORDER — ONDANSETRON HCL 4 MG/2ML IJ SOLN: 4.0000 mg | Freq: Three times a day (TID) | INTRAMUSCULAR | Status: DC | PRN

## 2015-12-18 MED ORDER — SCOPOLAMINE 1 MG/3DAYS TD PT72
1.0000 | MEDICATED_PATCH | Freq: Once | TRANSDERMAL | Status: DC
  Administered 2015-12-18: 1.5 mg via TRANSDERMAL

## 2015-12-18 MED ORDER — PROPOFOL 10 MG/ML IV BOLUS
INTRAVENOUS | Status: AC
  Filled 2015-12-18: qty 20

## 2015-12-18 MED ORDER — LACTATED RINGERS IV SOLN
INTRAVENOUS | Status: DC
  Administered 2015-12-18 (×2): via INTRAVENOUS

## 2015-12-18 MED ORDER — MIDAZOLAM HCL 2 MG/2ML IJ SOLN
INTRAMUSCULAR | Status: AC
  Filled 2015-12-18: qty 2

## 2015-12-18 MED ORDER — CEFAZOLIN SODIUM-DEXTROSE 2-3 GM-% IV SOLR
INTRAVENOUS | Status: AC
Start: 2015-12-18 — End: 2015-12-18
  Filled 2015-12-18: qty 50

## 2015-12-18 MED ORDER — LIDOCAINE HCL (CARDIAC) 20 MG/ML IV SOLN
INTRAVENOUS | Status: DC | PRN
  Administered 2015-12-18: 20 mg via INTRAVENOUS

## 2015-12-18 MED ORDER — SCOPOLAMINE 1 MG/3DAYS TD PT72
MEDICATED_PATCH | TRANSDERMAL | Status: AC
  Filled 2015-12-18: qty 1

## 2015-12-18 MED ORDER — OXYCODONE-ACETAMINOPHEN 5-325 MG PO TABS: 1.0000 | ORAL_TABLET | ORAL | Status: DC | PRN

## 2015-12-18 MED ORDER — EPHEDRINE 5 MG/ML INJ
INTRAVENOUS | Status: AC
  Filled 2015-12-18: qty 10

## 2015-12-18 MED ORDER — LIDOCAINE-EPINEPHRINE (PF) 1 %-1:200000 IJ SOLN
INTRAMUSCULAR | Status: DC | PRN
  Administered 2015-12-18: 30 mL

## 2015-12-18 MED ORDER — HYDROMORPHONE HCL 1 MG/ML IJ SOLN: 0.2500 mg | INTRAMUSCULAR | Status: DC | PRN

## 2015-12-18 MED ORDER — LACTATED RINGERS IV SOLN: INTRAVENOUS | Status: DC

## 2015-12-18 SURGICAL SUPPLY — 23 items
CLOTH BEACON ORANGE TIMEOUT ST (SAFETY) ×4 IMPLANT
ELECT LIGASURE LONG (ELECTRODE) IMPLANT
ELECT LIGASURE SHORT 9 REUSE (ELECTRODE) IMPLANT
GAUZE SPONGE 4X4 16PLY XRAY LF (GAUZE/BANDAGES/DRESSINGS) ×2 IMPLANT
GLOVE BIO SURGEON STRL SZ 6.5 (GLOVE) ×3 IMPLANT
GLOVE BIO SURGEONS STRL SZ 6.5 (GLOVE) ×1
GLOVE BIOGEL PI IND STRL 7.0 (GLOVE) ×4 IMPLANT
GLOVE BIOGEL PI INDICATOR 7.0 (GLOVE) ×4
GLOVE ECLIPSE 6.0 STRL STRAW (GLOVE) ×4 IMPLANT
GOWN STRL REUS W/TWL LRG LVL3 (GOWN DISPOSABLE) ×16 IMPLANT
NS IRRIG 1000ML POUR BTL (IV SOLUTION) ×4 IMPLANT
PACK VAGINAL WOMENS (CUSTOM PROCEDURE TRAY) ×4 IMPLANT
PAD OB MATERNITY 4.3X12.25 (PERSONAL CARE ITEMS) ×4 IMPLANT
SHEARS FOC LG CVD HARMONIC 17C (MISCELLANEOUS) IMPLANT
SUT VIC AB 0 CT1 18XCR BRD8 (SUTURE) ×4 IMPLANT
SUT VIC AB 0 CT1 36 (SUTURE) ×4 IMPLANT
SUT VIC AB 0 CT1 8-18 (SUTURE) ×12
SUT VIC AB 2-0 CT1 27 (SUTURE) ×8
SUT VIC AB 2-0 CT1 TAPERPNT 27 (SUTURE) ×4 IMPLANT
SUT VICRYL 0 TIES 12 18 (SUTURE) ×4 IMPLANT
TOWEL OR 17X24 6PK STRL BLUE (TOWEL DISPOSABLE) ×8 IMPLANT
TRAY FOLEY CATH SILVER 14FR (SET/KITS/TRAYS/PACK) ×4 IMPLANT
WATER STERILE IRR 1000ML POUR (IV SOLUTION) ×4 IMPLANT

## 2015-12-18 NOTE — Anesthesia Procedure Notes (Signed)
Spinal Patient location during procedure: OR Start time: 12/18/2015 9:52 AM Staffing Anesthesiologist: Josephine Igo Performed by: anesthesiologist  Preanesthetic Checklist Completed: patient identified, site marked, surgical consent, pre-op evaluation, timeout performed, IV checked, risks and benefits discussed and monitors and equipment checked Spinal Block Patient position: sitting Prep: site prepped and draped and DuraPrep Patient monitoring: heart rate, cardiac monitor, continuous pulse ox and blood pressure Approach: midline Location: L4-5 Injection technique: single-shot Needle Needle type: Sprotte  Needle gauge: 24 G Needle length: 9 cm Assessment Sensory level: T6 Additional Notes Patient tolerated procedure well. Adequate sensory level.

## 2015-12-18 NOTE — Addendum Note (Signed)
Addendum  created 12/18/15 1643 by Raenette Rover, CRNA   Modules edited: Clinical Notes   Clinical Notes:  File: [id number]

## 2015-12-18 NOTE — Progress Notes (Signed)
Interpreter, Mahitha Chessman, at bedside. Reviewed plan of care, assessment to include pain. Answered patients questions.

## 2015-12-18 NOTE — Anesthesia Postprocedure Evaluation (Signed)
Anesthesia Post Note  Patient: Theresa Barry  Procedure(s) Performed: Procedure(s) (LRB): HYSTERECTOMY VAGINAL (N/A) BILATERAL SALPINGECTOMY (Bilateral)  Patient location during evaluation: PACU Anesthesia Type: Spinal Level of consciousness: awake and alert and oriented Pain management: pain level controlled Vital Signs Assessment: post-procedure vital signs reviewed and stable Respiratory status: spontaneous breathing, nonlabored ventilation, respiratory function stable and patient connected to nasal cannula oxygen Cardiovascular status: blood pressure returned to baseline and stable Postop Assessment: no signs of nausea or vomiting, no headache, no backache, spinal receding and patient able to bend at knees Anesthetic complications: no    Last Vitals:  Filed Vitals:   12/18/15 1230 12/18/15 1245  BP: 91/68 102/74  Pulse: 68 78  Temp:    Resp: 16 12    Last Pain: There were no vitals filed for this visit.               Furious Chiarelli A.

## 2015-12-18 NOTE — Transfer of Care (Signed)
Immediate Anesthesia Transfer of Care Note  Patient: Theresa Carmona-Castro  Procedure(s) Performed: Procedure(s): HYSTERECTOMY VAGINAL (N/A) BILATERAL SALPINGECTOMY (Bilateral)  Patient Location: PACU  Anesthesia Type:Spinal  Level of Consciousness: awake  Airway & Oxygen Therapy: Patient Spontanous Breathing  Post-op Assessment: Report given to RN and Post -op Vital signs reviewed and stable  Post vital signs: stable  Last Vitals:  Filed Vitals:   12/18/15 0743  BP: 113/69  Pulse: 72  Temp: 36.6 C  Resp: 16    Complications: No apparent anesthesia complications

## 2015-12-18 NOTE — Anesthesia Postprocedure Evaluation (Signed)
Anesthesia Post Note  Patient: Theresa Barry  Procedure(s) Performed: Procedure(s) (LRB): HYSTERECTOMY VAGINAL (N/A) BILATERAL SALPINGECTOMY (Bilateral)  Patient location during evaluation: [facility] Anesthesia Type: Spinal Level of consciousness: awake, awake and alert, oriented and patient cooperative Pain management: pain level controlled Vital Signs Assessment: post-procedure vital signs reviewed and stable Respiratory status: spontaneous breathing, nonlabored ventilation and respiratory function stable Cardiovascular status: stable Postop Assessment: no headache, no backache, patient able to bend at knees and no signs of nausea or vomiting Anesthetic complications: no    Last Vitals:  Filed Vitals:   12/18/15 1444 12/18/15 1546  BP: 102/68 95/65  Pulse: 90 91  Temp: 36.4 C 36.7 C  Resp: 16 16    Last Pain:  Filed Vitals:   12/18/15 1600  PainSc: 2                  Sevon Rotert L

## 2015-12-18 NOTE — Anesthesia Preprocedure Evaluation (Addendum)
Anesthesia Evaluation  Patient identified by MRN, date of birth, ID band Patient awake    Reviewed: Allergy & Precautions, NPO status , Patient's Chart, lab work & pertinent test results  Airway Mallampati: III  TM Distance: >3 FB Neck ROM: Full    Dental no notable dental hx. (+) Teeth Intact   Pulmonary neg pulmonary ROS,    Pulmonary exam normal breath sounds clear to auscultation- rhonchi       Cardiovascular negative cardio ROS Normal cardiovascular exam Rhythm:Regular Rate:Normal     Neuro/Psych negative neurological ROS  negative psych ROS   GI/Hepatic negative GI ROS, Neg liver ROS,   Endo/Other  diabetes, Well Controlled, Type 2Obesity   Renal/GU negative Renal ROS  negative genitourinary   Musculoskeletal negative musculoskeletal ROS (+)   Abdominal (+) + obese,   Peds  Hematology  (+) anemia ,   Anesthesia Other Findings   Reproductive/Obstetrics AUB Fibroid uterus                           Anesthesia Physical Anesthesia Plan  ASA: II  Anesthesia Plan: Spinal   Post-op Pain Management:    Induction: Intravenous  Airway Management Planned: Natural Airway  Additional Equipment:   Intra-op Plan:   Post-operative Plan:   Informed Consent: I have reviewed the patients History and Physical, chart, labs and discussed the procedure including the risks, benefits and alternatives for the proposed anesthesia with the patient or authorized representative who has indicated his/her understanding and acceptance.   Dental advisory given  Plan Discussed with: CRNA, Anesthesiologist and Surgeon  Anesthesia Plan Comments:        Anesthesia Quick Evaluation

## 2015-12-18 NOTE — H&P (Signed)
Theresa Barry is an 40 y.o. female. [id number] No LMP recorded. Patient has a history of DUB, menorrhagia, fibroid uterus with prolapse uterine fibroid scheduled today for TVH and BS. She had a vaginal myomectomy in 2014.   She received Lupron in November   OB History: [id number] Past Surgical History  Procedure Laterality Date  . Ovarian cyst removal    . Hysteroscopy w/d&c N/A 05/16/2013    Procedure: DILATATION AND CURETTAGE /HYSTEROSCOPY  Removal of [name];  Surgeon: Osborne Oman, MD;  Location: [facility];  Service: Gynecology;  Laterality: N/A;  . Fibroidectomy      vaginally    Menstrual History:  No LMP recorded.    Past Medical History  Diagnosis Date  . Medical history non-contributory   . Fibroid   . Diabetes mellitus without complication ([place])   . Anemia     Past Surgical History  Procedure Laterality Date  . Ovarian cyst removal    . Hysteroscopy w/d&c N/A 05/16/2013    Procedure: DILATATION AND CURETTAGE /HYSTEROSCOPY  Removal of [name];  Surgeon: Osborne Oman, MD;  Location: [facility];  Service: Gynecology;  Laterality: N/A;  . Fibroidectomy      vaginally    History reviewed. No pertinent family history.  Social History:  reports that she has never smoked. She has never used smokeless tobacco. She reports that she does not drink alcohol or use illicit drugs.  Allergies: No Known Allergies  Prescriptions prior to admission  Medication Sig Dispense Refill Last Dose  . ferrous sulfate 325 (65 FE) MG tablet Take 325 mg by mouth 2 (two) times daily with a meal.   Past Week at Unknown time  . Prenatal Vit-Fe Fumarate-FA (MULTIVITAMIN-PRENATAL) 27-0.8 MG TABS tablet Take 1 tablet by mouth daily.    12/17/2015 at Unknown time  . ibuprofen (ADVIL,MOTRIN) 800 MG tablet Take 800 mg by mouth every 8 (eight) hours as needed for mild pain or moderate pain.   prn  . ranitidine (ZANTAC) 150 MG tablet Take 1 tablet (150 mg total) by mouth 2  (two) times daily. (Patient not taking: Reported on 12/18/2015) 60 tablet 0 Not Taking    Review of Systems  Constitutional: Negative.   Respiratory: Negative.   Gastrointestinal: Negative.   Genitourinary:       Scant bleeding    Blood pressure 113/69, pulse 72, temperature 97.9 F (36.6 C), temperature source Oral, resp. rate 16, SpO2 100 %. Physical Exam  Constitutional: She appears well-developed. No distress.  Respiratory: Effort normal.  Psychiatric: She has a normal mood and affect. Her behavior is normal.    NAD Chest chear nl effort Abdomen soft NT no mass Extremities normal Results for orders placed or performed during the hospital encounter of 12/18/15 (from the past 24 hour(s))  hCG, serum, qualitative     Status: None   Collection Time: 12/18/15  8:10 AM  Result Value Ref Range   Preg, Serum NEGATIVE NEGATIVE    CBC    Component Value Date/Time   WBC 5.6 12/11/2015 1105   RBC 4.80 12/11/2015 1105   HGB 11.8* 12/11/2015 1105   HCT 36.8 12/11/2015 1105   PLT 310 12/11/2015 1105   MCV 76.7* 12/11/2015 1105   MCH 24.6* 12/11/2015 1105   MCHC 32.1 12/11/2015 1105   RDW 20.4* 12/11/2015 1105   LYMPHSABS 2.7 08/12/2015 1850   MONOABS 0.8 08/12/2015 1850   EOSABS 0.2 08/12/2015 1850   BASOSABS 0.0 08/12/2015 1850    CBG (  last 3)   Recent Labs  12/18/15 0923  GLUCAP 99      Assessment/Plan: H/O fibroid uterus, menorrhagia, anemia and previous myomectomy. Plan definitive therapy with TVH and BS. The procedure and risk of anesthesia, bleeding, infection, laparotomy, bowel and urinary tract damage and pain were discussed and questions answered   Genifer Lazenby 12/18/2015, 9:21 AM

## 2015-12-18 NOTE — Op Note (Signed)
Theresa Barry PROCEDURE DATE: 12/18/2015  PREOPERATIVE DIAGNOSIS:  Symptomatic fibroids, menorrhagia POSTOPERATIVE DIAGNOSIS:  Symptomatic fibroids, menorrhagia SURGEON:   Woodroe Mode, MD  ASSISTANT: Lavonia Drafts, M.D. OPERATION:  Total Vaginal hysterectomy and bilateral salpingectomy ANESTHESIA: Spinal .  INDICATIONS: The patient is a 40 y.o. [id number] with history of symptomatic uterine fibroids/menorrhagia. The patient made a decision to undergo definite surgical treatment. On the preoperative visit, the risks, benefits, indications, and alternatives of the procedure were reviewed with the patient.  On the day of surgery, the risks of surgery were again discussed with the patient including but not limited to: bleeding which may require transfusion or reoperation; infection which may require antibiotics; injury to bowel, bladder, ureters or other surrounding organs; need for additional procedures; thromboembolic phenomenon, incisional problems and other postoperative/anesthesia complications. Written informed consent was obtained.    OPERATIVE FINDINGS: A 6 week size uterus with prolapsed cervical fibroid with normal tubes and ovaries bilaterally.  ESTIMATED BLOOD LOSS: 200 ml FLUIDS:  2000 ml of Lactated Ringers URINE OUTPUT:  100 ml of clear yellow urine. SPECIMENS:  Uterus and cervix sent to pathology COMPLICATIONS:  None immediate.  DESCRIPTION OF PROCEDURE:  The patient received intravenous antibiotics and had sequential compression devices applied to her lower extremities while in the preoperative area.  She was then taken to the operating room where general anesthesia was administered and was found to be adequate.  She was placed in the dorsal lithotomy position, and was prepped and draped in a sterile manner.  A Foley catheter was inserted into her bladder and attached to constant drainage. After an adequate timeout was performed, attention was turned to her pelvis.   A weighted speculum was then placed in the vagina. A prolapsed fibroid was visualized at the cervical os. It measured about 5 x 6 cm. He had a broad pedicle coming off the posterior cervix. Kelly clamps were placed across the pedicle and the fibroid was excised with scissors.Tthe anterior and posterior lips of the cervix were grasped bilaterally with tenaculums.  The cervix was then injected circumferentially with 1% lidocaine with with epinephrine solution to maintain hemostasis.  The cervix was then circumferentially incised, and the bladder was dissected off the pubocervical fascia anteriorly without complication.  The posterior cul-de-sac was then entered sharply without difficulty and the [place] retractor was placed.  The Heaney clamp was then used to clamp the uterosacral ligaments on either side.  They were then cut and sutured ligated with 0 Vicryl. Of note, all sutures used in this case were 0 Vicryl unless otherwise noted.   The cardinal ligaments were then clamped, cut and ligated. The anterior peritoneum was then entered and a retractor was placed to retract the bladder. The uterine vessels and broad ligaments were then serially clamped with the Heaney clamps, cut, and suture ligated on both sides.  Excellent hemostasis was noted at this point.  The uterus was then delivered via the posterior cul-de-sac, and the cornua were clamped with the Heaney clamps, transected, and the uterus was delivered and sent to pathology. These pedicles were then suture ligated to ensure hemostasis.  After completion of the hysterectomy, all pedicles from the uterosacral ligament to the cornua were examined hemostasis was confirmed. The right fallopian tube was identified and suspended with Cup clamps. Heaney clamp was placed across the base of the tube and the tube was excised and the pedicle was ligated with 0 Vicryl. Same procedure was done on the left side. Good hemostasis was seen  The vaginal cuff was then closed with  a in a running locked fashion with care given to incorporate the uterosacral pedicles bilaterally.  All instruments were then removed from the pelvis   The patient tolerated the procedure well.  All instruments, needles, and sponge counts were correct x 2. The patient was taken to the recovery room in stable condition.    Woodroe Mode, MD  Attending [place], [facility]

## 2015-12-19 ENCOUNTER — Encounter (HOSPITAL_COMMUNITY): Payer: Self-pay | Admitting: Obstetrics & Gynecology

## 2015-12-19 LAB — CBC
HEMATOCRIT: 31.1 % — AB (ref 36.0–46.0)
HEMOGLOBIN: 10.4 g/dL — AB (ref 12.0–15.0)
MCH: 25.8 pg — ABNORMAL LOW (ref 26.0–34.0)
MCHC: 33.4 g/dL (ref 30.0–36.0)
MCV: 77.2 fL — AB (ref 78.0–100.0)
Platelets: 305 10*3/uL (ref 150–400)
RBC: 4.03 MIL/uL (ref 3.87–5.11)
RDW: 19 % — ABNORMAL HIGH (ref 11.5–15.5)
WBC: 15.2 10*3/uL — AB (ref 4.0–10.5)

## 2015-12-19 MED ORDER — SODIUM CHLORIDE 0.9 % IV SOLN: 250.0000 mL | INTRAVENOUS | Status: DC | PRN

## 2015-12-19 MED ORDER — SODIUM CHLORIDE 0.9% FLUSH: 3.0000 mL | INTRAVENOUS | Status: DC | PRN

## 2015-12-19 MED ORDER — SODIUM CHLORIDE 0.9% FLUSH: 3.0000 mL | Freq: Two times a day (BID) | INTRAVENOUS | Status: DC

## 2015-12-19 MED ORDER — OXYCODONE-ACETAMINOPHEN 5-325 MG PO TABS: 1.0000 | ORAL_TABLET | ORAL | Status: DC | PRN

## 2015-12-19 NOTE — Discharge Summary (Signed)
Physician Discharge Summary  Patient ID: Theresa Barry MRN: [id number] DOB/AGE: [birth year] 42 y.o.  Admit date: 12/18/2015 Discharge date: 12/19/2015  Admission Diagnoses:fibroid uterus and DUB  Discharge Diagnoses: same Active Problems:   Prolapsing fibroid of cervix   Bleeding from cervical fibroid   Discharged Condition: good  Hospital Course: [id number] No LMP recorded. Admitted for TVH and BS for above indications OPERATIVE FINDINGS: A 6 week size uterus with prolapsed cervical fibroid with normal tubes and ovaries bilaterally.  ESTIMATED BLOOD LOSS: 200 ml FLUIDS: 2000 ml of Lactated Ringers URINE OUTPUT: 100 ml of clear yellow urine. SPECIMENS: Uterus and cervix sent to pathology COMPLICATIONS: None immediate.  DESCRIPTION OF PROCEDURE: The patient received intravenous antibiotics and had sequential compression devices applied to her lower extremities while in the preoperative area. She was then taken to the operating room where general anesthesia was administered and was found to be adequate. She was placed in the dorsal lithotomy position, and was prepped and draped in a sterile manner. A Foley catheter was inserted into her bladder and attached to constant drainage. After an adequate timeout was performed, attention was turned to her pelvis. A weighted speculum was then placed in the vagina. A prolapsed fibroid was visualized at the cervical os. It measured about 5 x 6 cm. He had a broad pedicle coming off the posterior cervix. Kelly clamps were placed across the pedicle and the fibroid was excised with scissors.Tthe anterior and posterior lips of the cervix were grasped bilaterally with tenaculums. The cervix was then injected circumferentially with 1% lidocaine with with epinephrine solution to maintain hemostasis. The cervix was then circumferentially incised, and the bladder was dissected off the pubocervical fascia anteriorly without complication. The  posterior cul-de-sac was then entered sharply without difficulty and the [place] retractor was placed. The Heaney clamp was then used to clamp the uterosacral ligaments on either side. They were then cut and sutured ligated with 0 Vicryl. Of note, all sutures used in this case were 0 Vicryl unless otherwise noted. The cardinal ligaments were then clamped, cut and ligated. The anterior peritoneum was then entered and a retractor was placed to retract the bladder. The uterine vessels and broad ligaments were then serially clamped with the Heaney clamps, cut, and suture ligated on both sides. Excellent hemostasis was noted at this point. The uterus was then delivered via the posterior cul-de-sac, and the cornua were clamped with the Heaney clamps, transected, and the uterus was delivered and sent to pathology. These pedicles were then suture ligated to ensure hemostasis. After completion of the hysterectomy, all pedicles from the uterosacral ligament to the cornua were examined hemostasis was confirmed. The right fallopian tube was identified and suspended with Cup clamps. Heaney clamp was placed across the base of the tube and the tube was excised and the pedicle was ligated with 0 Vicryl. Same procedure was done on the left side. Good hemostasis was seen The vaginal cuff was then closed with a in a running locked fashion with care given to incorporate the uterosacral pedicles bilaterally. All instruments were then removed from the pelvis The patient tolerated the procedure well. All instruments, needles, and sponge counts were correct x 2. The patient was taken to the recovery room in stable condition.   Woodroe Mode, MD   Consults: None  Significant Diagnostic Studies: labs:  CBC    Component Value Date/Time   WBC 15.2* 12/19/2015 0553   RBC 4.03 12/19/2015 0553   HGB 10.4* 12/19/2015 [id number]  HCT 31.1* 12/19/2015 0553   PLT 305 12/19/2015 0553   MCV 77.2* 12/19/2015 0553   MCH 25.8*  12/19/2015 0553   MCHC 33.4 12/19/2015 0553   RDW 19.0* 12/19/2015 0553   LYMPHSABS 2.7 08/12/2015 1850   MONOABS 0.8 08/12/2015 1850   EOSABS 0.2 08/12/2015 1850   BASOSABS 0.0 08/12/2015 1850      Treatments: surgery: TVH BS  Discharge Exam: Blood pressure 93/53, pulse 82, temperature 98.1 F (36.7 C), temperature source Oral, resp. rate 16, height 5' 2.5" (1.588 m), weight 82.101 kg (181 lb), SpO2 97 %. General appearance: alert, cooperative and no distress Resp: nl effort GI: soft, non-tender; bowel sounds normal; no masses,  no organomegaly Extremities: extremities normal, atraumatic, no cyanosis or edema  Disposition: 01-Home or Self Care     Medication List    STOP taking these medications        ferrous sulfate 325 (65 FE) MG tablet     ranitidine 150 MG tablet  Commonly known as:  ZANTAC      TAKE these medications        ibuprofen 800 MG tablet  Commonly known as:  ADVIL,MOTRIN  Take 800 mg by mouth every 8 (eight) hours as needed for mild pain or moderate pain.     multivitamin-prenatal 27-0.8 MG Tabs tablet  Take 1 tablet by mouth daily.     oxyCODONE-acetaminophen 5-325 MG tablet  Commonly known as:  PERCOCET/ROXICET  Take 1-2 tablets by mouth every 4 (four) hours as needed for severe pain (moderate to severe pain (when tolerating fluids)).           Follow-up Information    Follow up with [facility] In 4 weeks.   Specialty:  Obstetrics and Gynecology   Contact information:   [place] [phone]      Signed: Emeterio Reeve 12/19/2015, 8:17 AM

## 2015-12-19 NOTE — Discharge Instructions (Signed)
Histerectoma vaginal, cuidados posteriores (Vaginal Hysterectomy, Care After) Siga estas instrucciones durante las prximas semanas. Estas indicaciones le proporcionan informacin general acerca de cmo deber cuidarse despus del procedimiento. El mdico tambin podr darle instrucciones ms especficas. El tratamiento se ha planificado de acuerdo a las prcticas mdicas actuales, pero a veces se producen problemas. Comunquese con el mdico si tiene algn problema o tiene dudas despus del procedimiento.  QU ESPERAR DESPUS DEL PROCEDIMIENTO Despus del procedimiento, es normal tener los siguientes sntomas:  Dolor.  Cansancio.  Prdida del apetito.  Menos inters sexual. La recuperacin de esta ciruga demora de 4 a 6 semanas.  [place] los medicamentos para Clinical biochemist como se lo indic el mdico. No tome medicamentos de venta libre para Glass blower/designer sin consultar antes al mdico.  Cambie el vendaje como se lo indic el mdico.    Tome duchas en lugar de baos durante 2 a 3 semanas. Pregntele al mdico cundo es seguro comenzar a tomar duchas.  No se haga duchas vaginales, no use tampones ni tenga relaciones sexuales durante al menos 6semanas o hasta que el mdico la autorice.  Siga las indicaciones del mdico con respecto a la actividad fsica, a Surveyor, quantity, a conducir el automvil y a las actividades en general.  Descanse y duerma lo suficiente.  No levante nada que sea ms pesado que un galn de Air traffic controller (alrededor de 10lb [4.5kg]) durante el primer mes posterior a la Leisure centre manager.  Puede retomar su dieta normal si el mdico la autoriza.  No beba alcohol hasta que el mdico se lo autorice.  Si tiene estreimiento, pregntele al mdico si puede tomar un laxante suave.  Los alimentos con alto contenido de [place] tambin pueden tener un efecto laxante. Coma muchas frutas y vegetales crudos, cereales integrales y  frijoles.  Beba suficiente lquido para Consulting civil engineer orina clara o de color amarillo plido.  Trate de que haya alguna persona en su casa para ayudarla con las tareas del hogar durante 1 a 2 semanas despus de la [place].  Cumpla con todas las visitas de control. SOLICITE ATENCIN MDICA SI:   Siente escalofros o tiene fiebre.  Tiene sntomas de hinchazn, enrojecimiento o dolor en el rea de la incisin que estn empeorando.      La herida se abre.  Se siente mareada o sufre un desmayo.  Siente dolor o tiene una hemorragia al [employer].  Tiene diarrea persistente.  Tiene nuseas o vmitos persistentes.  Tiene flujo vaginal anormal.  Tiene una erupcin cutnea.  Tiene alguna reaccin anormal o aparece una alergia por los medicamentos.  El medicamento no Production designer, theatre/television/film. SOLICITE ATENCIN MDICA DE INMEDIATO SI:   Tiene fiebre y los sntomas empeoran repentinamente.  Siente un dolor abdominal intenso.  Siente dolor en el pecho.  Le falta el aire.  Se desmaya.  Siente dolor, u observa hinchazn o enrojecimiento en la pierna.  Tiene una hemorragia vaginal abundante, con cogulos. ASEGRESE DE QUE:  Comprende estas instrucciones.  Controlar su afeccin.  Recibir ayuda de inmediato si no mejora o si empeora.   Esta informacin no tiene Marine scientist el consejo del mdico. Asegrese de hacerle al mdico cualquier pregunta que tenga.   Document Released: 04/05/2007 Document Revised: 10/13/2014 Elsevier Interactive Patient Education [employer].

## 2015-12-19 NOTE — Progress Notes (Signed)
Interpreter was utilized. Pt verbalized understanding of d/c instructions, medications, follow up appts, when to seek medical attention, and belongings policy. All questions were answered. Pt was given a copy of d/c instructions in spanish, and was given her prescriptions. IV was d/c by NT without complications. Pt was able to void sufficiently. Pt was escorted to main entrance accompanied by NT and pts family who will be driving her home and helping around the house. Marry Guan

## 2016-02-04 ENCOUNTER — Ambulatory Visit (INDEPENDENT_AMBULATORY_CARE_PROVIDER_SITE_OTHER): Payer: Self-pay | Admitting: Obstetrics & Gynecology

## 2016-02-04 VITALS — BP 106/70 | HR 120 | Wt 181.6 lb

## 2016-02-04 DIAGNOSIS — D260 Other benign neoplasm of cervix uteri: Secondary | ICD-10-CM

## 2016-02-04 DIAGNOSIS — D259 Leiomyoma of uterus, unspecified: Secondary | ICD-10-CM

## 2016-02-04 MED ORDER — ZOLPIDEM TARTRATE 5 MG PO TABS: 5.0000 mg | ORAL_TABLET | Freq: Every evening | ORAL | Status: DC | PRN

## 2016-02-04 NOTE — Progress Notes (Signed)
Subjective:not sleeping well     Theresa Barry is a 40 y.o. female who presents to the clinic 7 weeks status post TVH for abnormal uterine bleeding and fibroids. Eating a regular diet without difficulty. Bowel movements are normal. The patient is not having any pain.  The following portions of the patient's history were reviewed and updated as appropriate: allergies, current medications, past family history, past medical history, past social history, past surgical history and problem list.  Review of Systems Neurological: positive for headaches Behavioral/Psych: positive for fatigue and insomnia    Objective:    BP 106/70 mmHg  Pulse 120  Wt 181 lb 9.6 oz (82.373 kg)  LMP  (LMP Unknown) General:  alert, cooperative and no distress  Abdomen: soft, bowel sounds active, non-tender        Assessment:    Postoperative course complicated by insomnia Operative findings again reviewed. Pathology report discussed.    Plan:    1. Continue any current medications. 2. Wound care discussed. 3. Activity restrictions: none 4. Anticipated return to work: now. 5. Follow up: prn Ambien 5 mg Hs 10 tabs, primary care f/u if not improved  Woodroe Mode, MD 02/04/2016

## 2016-02-04 NOTE — Patient Instructions (Addendum)
Histerectoma abdominal, cuidados posteriores (Abdominal Hysterectomy, Care After) Estas indicaciones le proporcionan informacin general acerca de cmo deber cuidarse despus del procedimiento. El mdico tambin podr darle instrucciones especficas. Comunquese con el mdico si tiene algn problema o tiene preguntas despus del procedimiento.  CUIDADOS EN EL HOGAR La recuperacin de esta ciruga demora de 4a 6semanas. [place] los medicamentos solamente como se lo haya indicado el mdico.  Cambie el vendaje como se lo haya indicado el mdico.  Debe ver nuevamente al mdico para que le saque los puntos.  Tome duchas durante 2a 3semanas. Pregntele al mdico cundo puede tomar duchas.  No se haga duchas vaginales, no use tampones ni tenga relaciones sexuales durante al menos 6semanas o segn las indicaciones.  Siga las indicaciones del mdico con respecto a la actividad fsica, a Surveyor, quantity, a conducir el automvil y a las actividades en general.  Descanse y duerma lo suficiente.  No levante nada que sea ms pesado que un galn de Air traffic controller (alrededor de 10libras [4.5kilogramos]) durante el primer mes posterior a la Leisure centre manager.  Retome su dieta normal segn lo indicado por el mdico.  No beba alcohol hasta que el mdico se lo autorice.  Tome un medicamento como ayuda para Management consultant (laxante) segn las indicaciones del mdico.  Los alimentos con alto contenido de fibra pueden tener un efecto laxante. Coma muchas frutas y vegetales crudos, cereales integrales y frijoles.  Beba suficiente lquido para mantener el pis (orina) claro o de color amarillo plido.  Pdale a alguna persona que la ayude con las tareas del hogar durante 1 a 2 semanas despus de la [place].  Cumpla con los controles mdicos segn las indicaciones. SOLICITE AYUDA SI:  Siente escalofros o tiene fiebre.  Tiene inflamacin, enrojecimiento o dolor en el rea del corte  (incisin).  Observa un lquido blanco amarillento (pus) que supura del corte.  Advierte un olor ftido que proviene de la herida o del vendaje.  La herida se abre.  Se siente mareada o tiene vahdos.  Siente dolor o tiene una hemorragia al [employer].  Tiene evacuaciones lquidas (diarrea) que no se interrumpen.  Tiene malestar estomacal (nuseas) o vmitos que no se interrumpen.  Le sale un lquido (secrecin) de la vagina.  Tiene una erupcin cutnea.  Tiene algn problema o sufre alguna reaccin a los medicamentos.  Necesita analgsicos ms fuertes. SOLICITE AYUDA DE INMEDIATO SI:   Tiene fiebre y los sntomas empeoran repentinamente.  Tiene mucho dolor de estmago (abdominal).  Siente dolor en el pecho.  Comienza a sentir falta de [place].  Pierde el conocimiento (se desmaya).  Siente dolor, u observa hinchazn o enrojecimiento en la pierna.  Sangra mucho por la vagina y observa grumos de tejido (cogulos). ASEGRESE DE QUE:   Comprende estas instrucciones.  Controlar su afeccin.  Recibir ayuda de inmediato si no mejora o si empeora.   Esta informacin no tiene Marine scientist el consejo del mdico. Asegrese de hacerle al mdico cualquier pregunta que tenga.   Document Released: 09/11/2011 Document Revised: 09/27/2013 Elsevier Interactive Patient Education [employer].

## 2016-10-24 ENCOUNTER — Other Ambulatory Visit (HOSPITAL_COMMUNITY): Payer: Self-pay | Admitting: *Deleted

## 2016-10-24 DIAGNOSIS — N644 Mastodynia: Secondary | ICD-10-CM

## 2016-10-24 DIAGNOSIS — N6459 Other signs and symptoms in breast: Secondary | ICD-10-CM

## 2016-11-06 ENCOUNTER — Ambulatory Visit
Admission: RE | Admit: 2016-11-06 | Discharge: 2016-11-06 | Disposition: A | Discharge status: Home / Self Care | Payer: No Typology Code available for payment source | Source: Ambulatory Visit | Attending: Obstetrics and Gynecology | Admitting: Obstetrics and Gynecology

## 2016-11-06 ENCOUNTER — Encounter (HOSPITAL_COMMUNITY): Payer: Self-pay

## 2016-11-06 ENCOUNTER — Ambulatory Visit (HOSPITAL_COMMUNITY)
Admission: RE | Admit: 2016-11-06 | Discharge: 2016-11-06 | Disposition: A | Discharge status: Home / Self Care | Payer: Self-pay | Source: Ambulatory Visit | Attending: Obstetrics and Gynecology | Admitting: Obstetrics and Gynecology

## 2016-11-06 VITALS — BP 112/74 | Temp 98.1°F | Ht 60.0 in

## 2016-11-06 DIAGNOSIS — N644 Mastodynia: Secondary | ICD-10-CM

## 2016-11-06 DIAGNOSIS — N6459 Other signs and symptoms in breast: Secondary | ICD-10-CM

## 2016-11-06 DIAGNOSIS — Z1239 Encounter for other screening for malignant neoplasm of breast: Secondary | ICD-10-CM

## 2016-11-06 NOTE — Patient Instructions (Signed)
Explained breast self awareness to Theresa Barry. Patient did not need a Pap smear today due to her history of a hysterectomy for benign reasons. Let her know that she no longer needs Pap smears due to her history of a hysterectomy for benign reasons. Referred patient to the [facility] for diagnostic mammogram and possible left breast ultrasound. Appointment scheduled for Thursday, November 06, 2016 at 1320. [name] verbalized understanding.  Idania Desouza, Arvil [place], RN 3:08 PM

## 2016-11-06 NOTE — Progress Notes (Signed)
Complaints of left upper breast pain that radiates to the outer breast x 1.5 years. Patient rates the pain at a 8 out of 10. Patient complained of left nipple inversion x 2.5 years.  Pap Smear:  Pap smear not completed today. Last Pap smear was in 2015 at the [facility] and normal per patient. Per patient has no history of an abnormal Pap smear. Patient has history of a hysterectomy 12/18/2015 due to fibroids, AUB, and Anemia. Patient no longer needs Pap smears due to her history of a hysterectomy for benign reasons per BCCCP and ACOG guidelines. Last Pap smear result is not in EPIC. Previous Pap smear result 04/04/2011 is in EPIC.  Physical exam: Breasts Breasts symmetrical. No skin abnormalities bilateral breasts. No nipple retraction bilateral breasts. No left nipple inversion observed on exam. No nipple discharge bilateral breasts. No lymphadenopathy. No lumps palpated bilateral breasts. Complaints of left breast at 3 o'clock on exam. Referred patient to the [place] for diagnostic mammogram and possible left breast ultrasound. Appointment scheduled for Thursday, November 06, 2016 at 1320.     Pelvic/Bimanual No Pap smear completed today since patient has a history of a hysterectomy for benign reasons. Pap smear not indicated per BCCCP guidelines.   Smoking History: Patient has never smoked.  Patient Navigation: Patient education provided. Access to services provided for patient through [facility] program. Spanish interpreter provided.  Used Spanish interpreter [employer] from [place].

## 2016-11-07 ENCOUNTER — Encounter (HOSPITAL_COMMUNITY): Payer: Self-pay | Admitting: *Deleted

## 2017-03-07 IMAGING — US US TRANSVAGINAL NON-OB
1 series · 13 of 25 positions shown · non-contrast
Comparison: None

CLINICAL DATA: Dysfunctional uterine bleeding x3 months, paragard
IUD



[Series 1: us non-ob tv/pel · 13 of 91 slices shown]
[im 1/91]
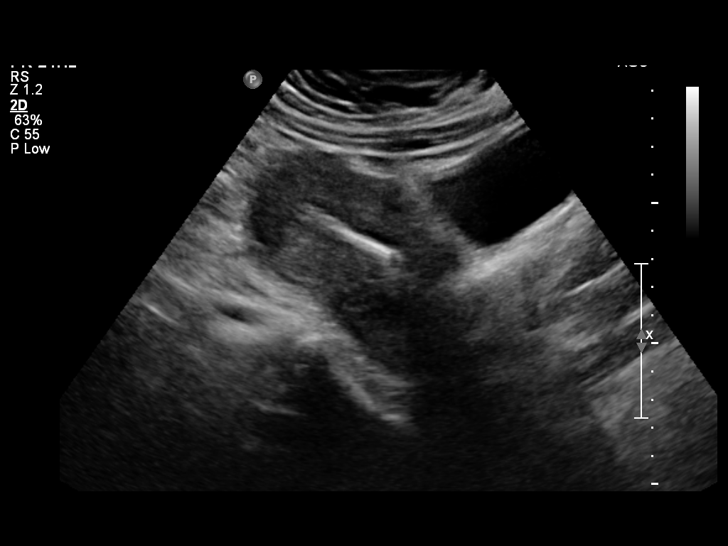
[im 8/91]
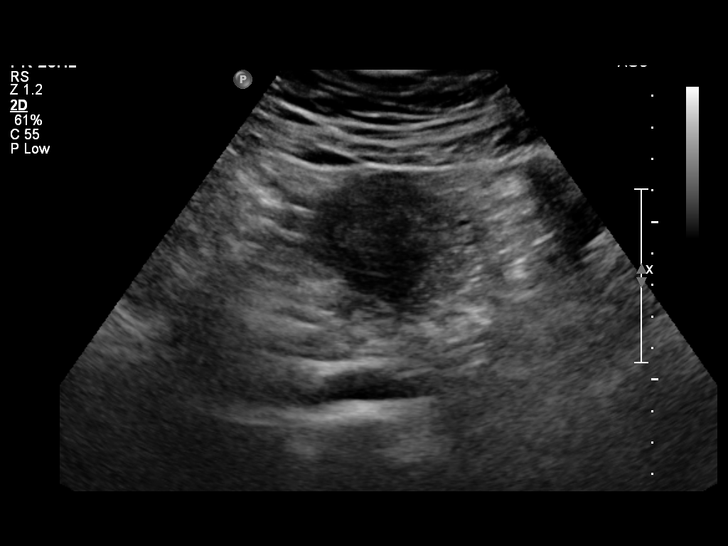
[im 16/91]
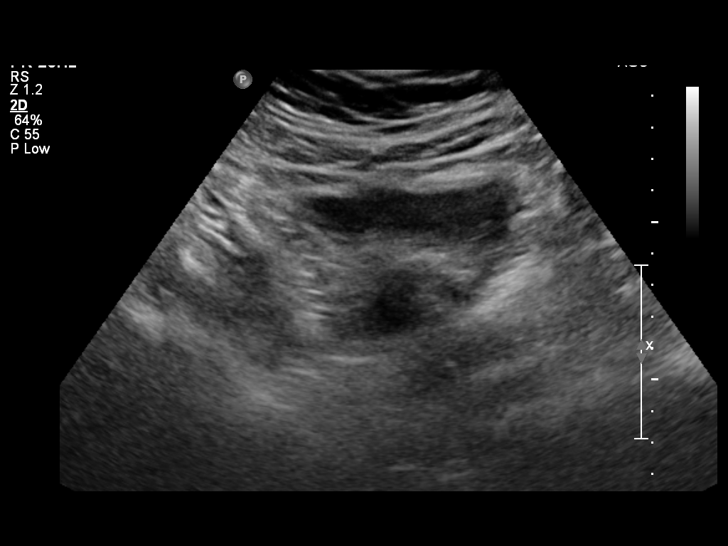
[im 23/91]
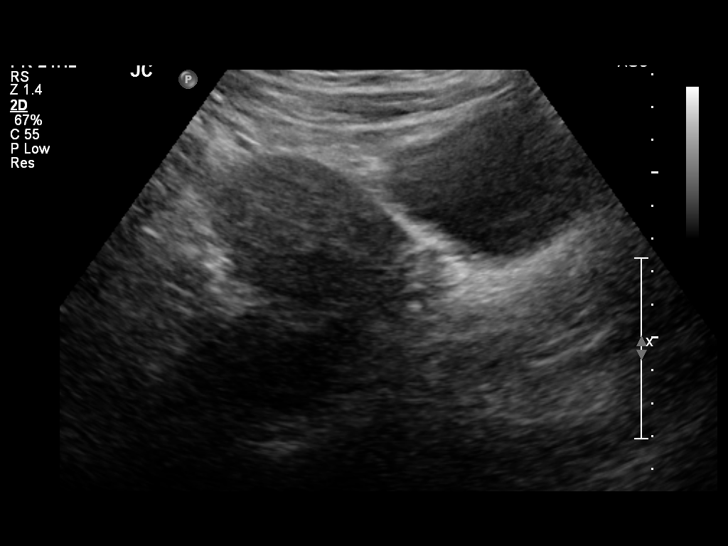
[im 31/91]
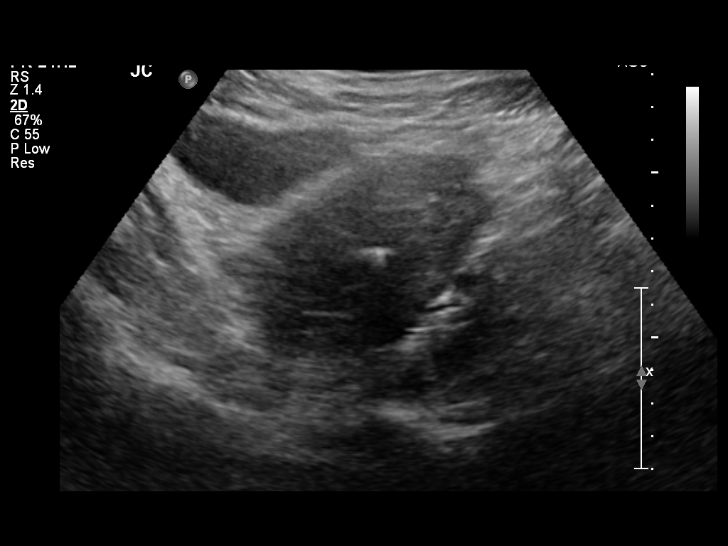
[im 38/91]
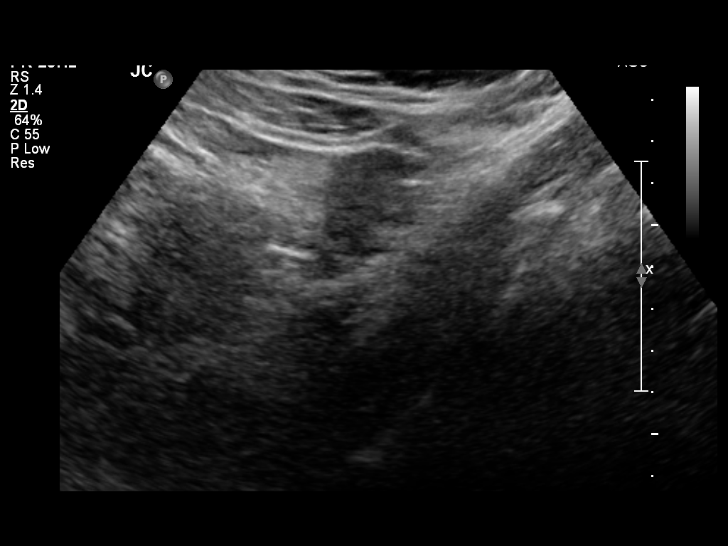
[im 46/91]
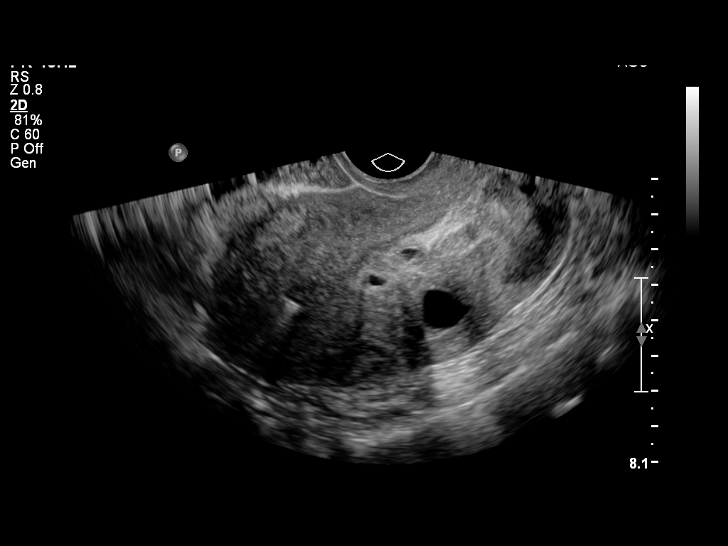
[im 53/91]
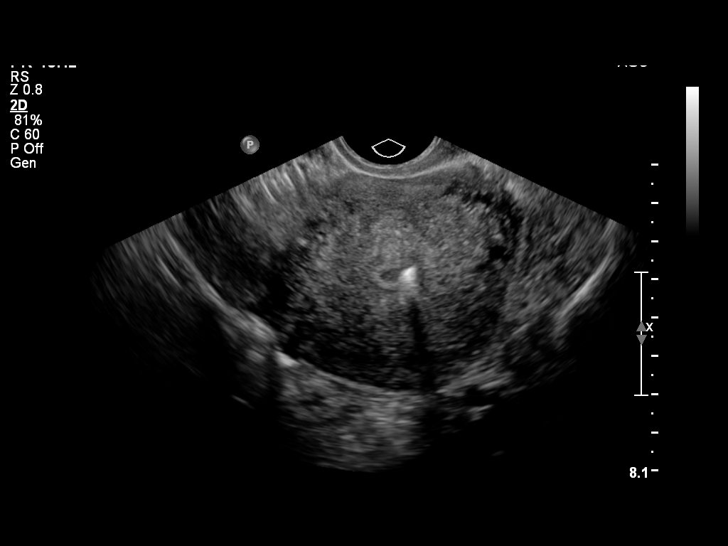
[im 61/91]
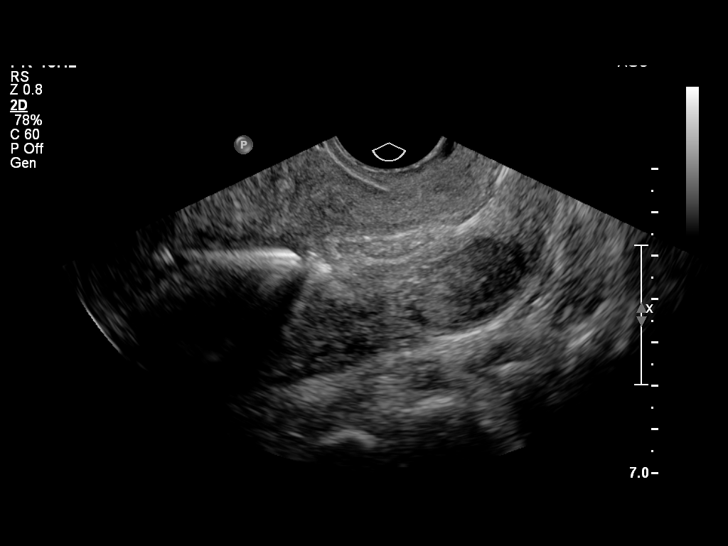
[im 68/91]
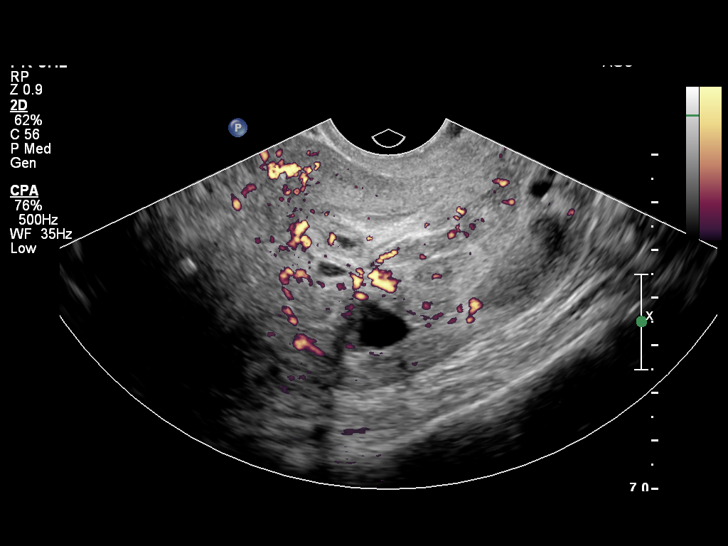
[im 76/91]
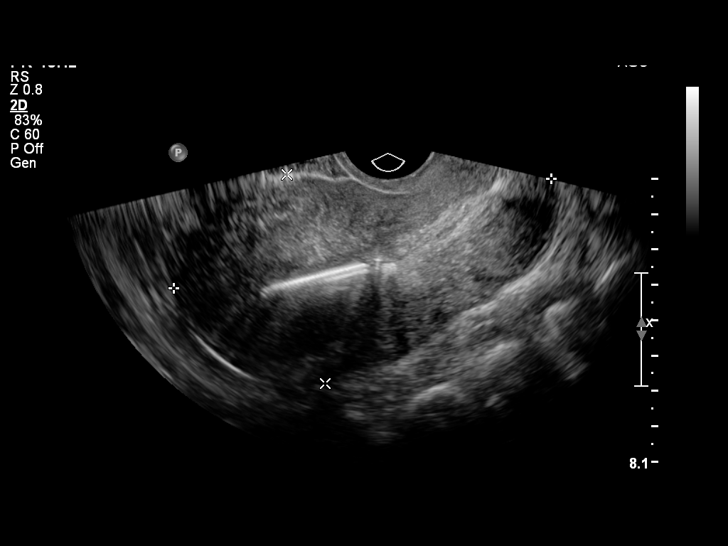
[im 83/91]
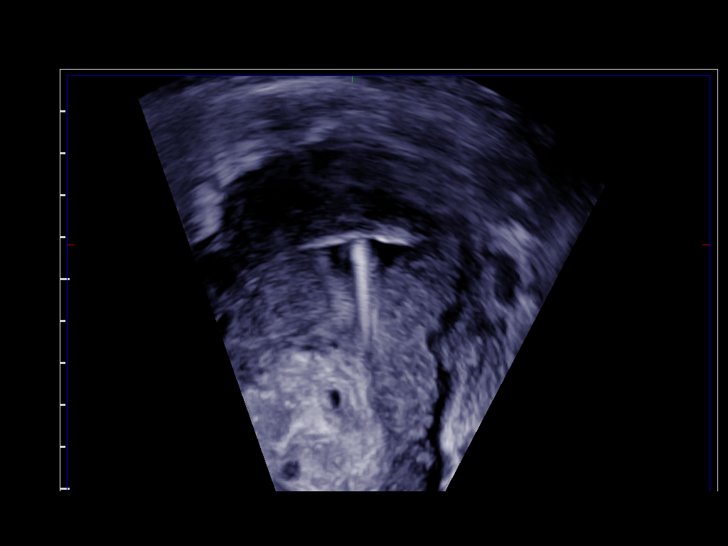
[im 91/91]
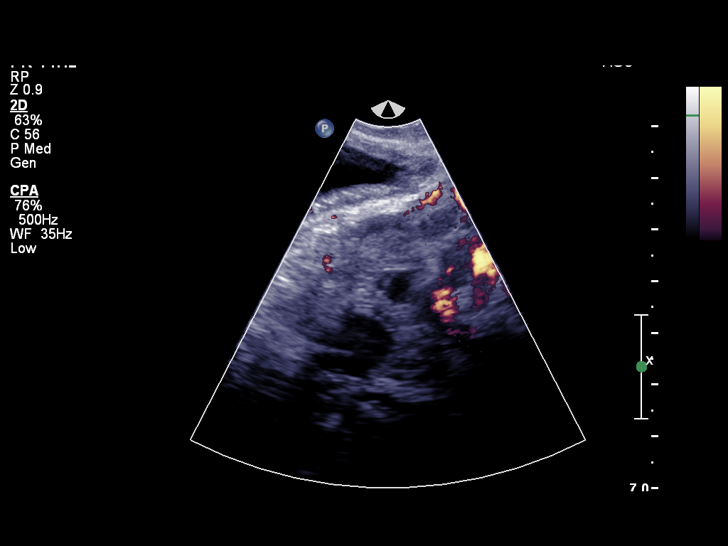

[13 of 25 positions shown; findings below may reference images not displayed]

FINDINGS: Uterus

Measurements: 11.1 x 6.0 x 6.7 cm. 2.9 x 1.6 x 1.9 cm probable
intramural/ submucosal fibroid in the right lower uterine segment.

Endometrium

Measures 5 mm in the uterine fundus. IUD in satisfactory position.
Mass-like thickening within the lower uterine segment, measuring
approximately 5.9 x 3.5 x 4.8 cm, with associated hypervascularity.

Right ovary

Measurements: 2.9 x 2.4 x 2.2 cm. Normal appearance/no adnexal mass.

Left ovary

Measurements: 3.9 x 2.5 x 2.7 cm. 1.9 x 1.6 x 1.8 cm simple cyst/
follicle, physiologic.

Other findings

No free fluid.
IMPRESSION: Mass-like thickening within the lower uterine segment, measuring up
to 5.9 cm, with associated hypervascularity.

Differential considerations include endometrial hyperplasia/polyp,
an intracavitary degenerating fibroid, or possibly endometrial
carcinoma.

IUD in satisfactory position.

## 2017-04-02 ENCOUNTER — Encounter (HOSPITAL_COMMUNITY): Payer: Self-pay | Admitting: Emergency Medicine

## 2017-04-02 ENCOUNTER — Ambulatory Visit (HOSPITAL_COMMUNITY)
Admission: EM | Admit: 2017-04-02 | Discharge: 2017-04-02 | Disposition: A | Discharge status: Home / Self Care | Payer: No Typology Code available for payment source | Attending: Physician Assistant | Admitting: Physician Assistant

## 2017-04-02 DIAGNOSIS — N611 Abscess of the breast and nipple: Secondary | ICD-10-CM

## 2017-04-02 DIAGNOSIS — R202 Paresthesia of skin: Secondary | ICD-10-CM

## 2017-04-02 LAB — GLUCOSE, CAPILLARY: Glucose-Capillary: 90 mg/dL (ref 65–99)

## 2017-04-02 MED ORDER — NAPROXEN 500 MG PO TABS: 500.0000 mg | ORAL_TABLET | Freq: Two times a day (BID) | ORAL | 0 refills | Status: AC

## 2017-04-02 NOTE — ED Provider Notes (Signed)
CSN: [id number]     Arrival date & time 04/02/17  1003 History   None    Chief Complaint  Patient presents with  . Leg Pain   (Consider location/radiation/quality/duration/timing/severity/associated sxs/prior Treatment) 41 yo female who comes in for multiple complaints  1. 1 week history of numbness/tingling of her lower extremities: She states it feels like ants crawling down her legs. It comes and goes, but states it is there more often than not. No association of activity and onset of numbness/tingling. Exercising helps relieves it. Patient states she has been increasing her exercising for the past month, but has increased the exercising regimen even more the last few weeks. States that she has tried ibuprofen with little help. Patient states history of diabetes was diagnosed with a high "glucose test", but she has never been on medication for diabetes, and does not regularly check her blood glucose.    2. Breast abscess: Patient states she noticed a small pimple on her right breast near the nipple a few days ago, she popped and drained it, and has since had pain and swelling in the area. She states she has had mammograms and they were all normal. Denies fever, chills, night sweats. Denies nipple discharge, breast mass, breast pain. Denies erythema, increased warmth.       Past Medical History:  Diagnosis Date  . Anemia   . Diabetes mellitus without complication ([place])   . Fibroid   . Medical history non-contributory    Past Surgical History:  Procedure Laterality Date  . ABDOMINAL HYSTERECTOMY    . BILATERAL SALPINGECTOMY Bilateral 12/18/2015   Procedure: BILATERAL SALPINGECTOMY;  Surgeon: Woodroe Mode, MD;  Location: [facility];  Service: Gynecology;  Laterality: Bilateral;  . fibroidectomy     vaginally  . HYSTEROSCOPY W/D&C N/A 05/16/2013   Procedure: DILATATION AND CURETTAGE /HYSTEROSCOPY  Removal of [name];  Surgeon: Osborne Oman, MD;  Location: [facility];  Service:  Gynecology;  Laterality: N/A;  . OVARIAN CYST REMOVAL    . VAGINAL HYSTERECTOMY N/A 12/18/2015   Procedure: HYSTERECTOMY VAGINAL;  Surgeon: Woodroe Mode, MD;  Location: [facility];  Service: Gynecology;  Laterality: N/A;   Family History  Problem Relation Age of Onset  . Diabetes Mother   . Diabetes Brother   . Breast cancer Paternal Aunt   . Breast cancer Paternal Aunt    Social History  Substance Use Topics  . Smoking status: Never Smoker  . Smokeless tobacco: Never Used  . Alcohol use No   OB History    Gravida Para Term Preterm AB Living   5 3 3   1 3    SAB TAB Ectopic Multiple Live Births   1     1 3      Review of Systems  Constitutional: Negative for chills, diaphoresis and fever.  Respiratory: Negative for shortness of breath and wheezing.   Cardiovascular: Negative for chest pain and palpitations.  Gastrointestinal: Negative for abdominal pain, constipation, diarrhea, nausea and vomiting.  Musculoskeletal: Negative for arthralgias, gait problem, joint swelling and myalgias.  Skin: Positive for wound.  Neurological: Positive for numbness. Negative for dizziness, weakness and headaches.    Allergies  Patient has no known allergies.  Home Medications   Prior to Admission medications   Medication Sig Start Date End Date Taking? Authorizing Provider  Prenatal Vit-Fe Fumarate-FA (MULTIVITAMIN-PRENATAL) 27-0.8 MG TABS tablet Take 1 tablet by mouth daily.    Yes [provider]  ibuprofen (ADVIL,MOTRIN) 800 MG tablet Take  800 mg by mouth every 8 (eight) hours as needed for mild pain or moderate pain.    [provider]  naproxen (NAPROSYN) 500 MG tablet Take 1 tablet (500 mg total) by mouth 2 (two) times daily. 04/02/17 04/12/17  Ok Edwards, PA-C  oxyCODONE-acetaminophen (PERCOCET/ROXICET) 5-325 MG tablet Take 1-2 tablets by mouth every 4 (four) hours as needed for severe pain (moderate to severe pain (when tolerating fluids)). Patient not taking: Reported on  11/06/2016 12/19/15   Woodroe Mode, MD  zolpidem (AMBIEN) 5 MG tablet Take 1 tablet (5 mg total) by mouth at bedtime as needed for sleep. 02/04/16 03/05/16  Woodroe Mode, MD   Meds Ordered and Administered this Visit  Medications - No data to display  BP 103/68 (BP Location: Right Arm)   Pulse 61   Temp 98.5 F (36.9 C) (Oral)   Resp 16   LMP  (LMP Unknown) Comment: bleeding since December  SpO2 99%  No data found.   Physical Exam  Constitutional: She appears well-developed and well-nourished. No distress.  HENT:  Head: Normocephalic and atraumatic.  Eyes: Conjunctivae are normal. Pupils are equal, round, and reactive to light.  Cardiovascular: Normal rate and regular rhythm.  Exam reveals no gallop and no friction rub.   No murmur heard. Pulses:      Dorsalis pedis pulses are 2+ on the right side, and 2+ on the left side.       Posterior tibial pulses are 2+ on the right side, and 2+ on the left side.  Pulmonary/Chest: Effort normal and breath sounds normal. She has no wheezes. She has no rales.  Musculoskeletal:       Right hip: Normal. She exhibits normal range of motion, normal strength and no tenderness.       Left hip: Normal. She exhibits normal range of motion, normal strength and no tenderness.       Right knee: Normal. She exhibits normal range of motion and no swelling. No tenderness found.       Left knee: Normal. She exhibits normal range of motion and no swelling. No tenderness found.       Right ankle: Normal. She exhibits normal range of motion and no swelling. No tenderness.       Left ankle: Normal. She exhibits normal range of motion and no swelling. No tenderness.       Right foot: There is normal range of motion and no deformity.       Left foot: There is normal range of motion and no deformity.  Feet:  Right Foot:  Protective Sensation: 4 sites tested. 4 sites sensed.  Skin Integrity: Positive for dry skin. Negative for ulcer, blister or skin breakdown.   Left Foot:  Protective Sensation: 4 sites tested. 4 sites sensed.  Skin Integrity: Positive for dry skin. Negative for ulcer, blister or skin breakdown.  Neurological: She has normal strength. No sensory deficit.  Skin: Skin is warm and dry.  Small 0.5 cm round swelling with a 0.1cm wound noted at the 10 o'clock of the areola.  No erythema, increased warmth, discharge. No tenderness on palpation of the breast.   Psychiatric: She has a normal mood and affect. Her behavior is normal. Judgment normal.    Urgent Care Course     Procedures (including critical care time)  Labs Review Labs Reviewed  GLUCOSE, CAPILLARY    Imaging Review No results found.      MDM   1. Paresthesia of  bilateral legs    1. Discussed lab results with patient, CBG was within normal limits. Discussed with patient given history and exam, numbness and tingling most likely caused by inflammation of the muscles that is irritating her nerves. Patient to start Naproxen 500mg  BID x 10 days for inflammation. Patient to warm up before exercise and stretch afterwards to prevent further inflammation of the muscles. Discussed with patient will need a PCP for maintenance of diabetes.  2. Discussed with patient no infection at the breast wound, where she has drained all the discharge. To keep dry and clean and allow it to heal.     Ok Edwards, PA-C 04/02/17 1224

## 2017-04-02 NOTE — ED Triage Notes (Signed)
Bilateral leg pain that started one week ago.

## 2017-05-19 ENCOUNTER — Ambulatory Visit: Payer: Self-pay | Attending: Internal Medicine | Admitting: Internal Medicine

## 2017-05-19 ENCOUNTER — Encounter: Payer: Self-pay | Admitting: Internal Medicine

## 2017-05-19 VITALS — BP 102/69 | HR 75 | Temp 98.4°F | Resp 16 | Wt 173.0 lb

## 2017-05-19 DIAGNOSIS — R1030 Lower abdominal pain, unspecified: Secondary | ICD-10-CM | POA: Insufficient documentation

## 2017-05-19 DIAGNOSIS — Z90722 Acquired absence of ovaries, bilateral: Secondary | ICD-10-CM | POA: Insufficient documentation

## 2017-05-19 DIAGNOSIS — G4700 Insomnia, unspecified: Secondary | ICD-10-CM | POA: Insufficient documentation

## 2017-05-19 DIAGNOSIS — Z90710 Acquired absence of both cervix and uterus: Secondary | ICD-10-CM | POA: Insufficient documentation

## 2017-05-19 DIAGNOSIS — Z803 Family history of malignant neoplasm of breast: Secondary | ICD-10-CM | POA: Insufficient documentation

## 2017-05-19 DIAGNOSIS — G8929 Other chronic pain: Secondary | ICD-10-CM

## 2017-05-19 DIAGNOSIS — M5416 Radiculopathy, lumbar region: Secondary | ICD-10-CM | POA: Insufficient documentation

## 2017-05-19 DIAGNOSIS — Z833 Family history of diabetes mellitus: Secondary | ICD-10-CM | POA: Insufficient documentation

## 2017-05-19 DIAGNOSIS — Z9889 Other specified postprocedural states: Secondary | ICD-10-CM | POA: Insufficient documentation

## 2017-05-19 MED ORDER — DICLOFENAC SODIUM 1 % TD GEL: 2.0000 g | Freq: Four times a day (QID) | TRANSDERMAL | 0 refills | Status: DC

## 2017-05-19 MED ORDER — METHOCARBAMOL 500 MG PO TABS: 500.0000 mg | ORAL_TABLET | Freq: Two times a day (BID) | ORAL | 0 refills | Status: DC | PRN

## 2017-05-19 MED FILL — VOLTAREN 1% GEL: 1 | 12 days supply | Qty: 100 | Fill #0

## 2017-05-19 NOTE — Patient Instructions (Signed)
Ejercicios para la espalda (Back Exercises) Si tiene dolor de espalda, haga estos ejercicios 2 o 3veces por da, o como se lo haya indicado el mdico. Cuando el dolor desaparezca, hgalos una vez por da, pero haga ms repeticiones de cada ejercicio. Si no le duele la espalda, haga estos ejercicios una vez por da o como se lo haya indicado el mdico. EJERCICIOS Rodilla al pecho Repita estos pasos 3 o 5veces seguidas con cada pierna: 1. Acustese boca arriba sobre una cama dura o sobre el suelo con las piernas extendidas. 2. Lleve una rodilla al pecho. 3. Mantenga la rodilla contra el pecho. Para lograrlo tmese la rodilla o el muslo. 4. Tire de la rodilla hasta sentir una elongacin suave en la parte baja de la espalda. 5. Mantenga la elongacin durante 10 a 30segundos. 6. Suelte y extienda la pierna lentamente. Inclinacin de la pelvis Repita estos pasos 5 o 10veces seguidas: 1. Acustese boca arriba sobre una cama dura o sobre el suelo con las piernas extendidas. 2. Flexione las rodillas de manera que apunten al techo. Los pies deben estar apoyados en el suelo. 3. Contraiga los msculos de la parte baja del vientre (abdomen) para empujar la zona lumbar contra el suelo. Este movimiento har que el cccix apunte hacia el techo, en lugar de apuntar hacia abajo en direccin a los pies o al suelo. 4. Mantenga esta posicin durante 5 a 10segundos mientras contrae suavemente los msculos y respira con normalidad. El perro y el gato Repita estos pasos hasta que la zona lumbar se curve con ms facilidad: 1. Apoye las palmas de las manos y las rodillas sobre una superficie firme. Las manos deben estar alineadas con los hombros y las rodillas con las caderas. Puede colocarse almohadillas debajo de las rodillas. 2. Deje caer la cabeza y lleve el cccix hacia abajo de modo que apunte en direccin al suelo para que la zona lumbar se arquee como el lomo de un gato asustado. 3. Mantenga esta posicin  durante 5segundos. 4. Lentamente, levante la cabeza y lleve el cccix hacia arriba de modo que apunte en direccin al techo para que la espalda se arquee (hunda) como el lomo de un perro contento. 5. Mantenga esta posicin durante 5segundos. Flexiones de brazos Repita estos pasos 5 o 10veces seguidas: 1. Acustese boca abajo en el suelo. 2. Ponga las manos cerca de la cabeza, separadas aproximadamente al ancho de los hombros. 3. Con la espalda relajada y las caderas apoyadas en el suelo, extienda lentamente los brazos para levantar la mitad superior del cuerpo y elevar los hombros. No use los msculos de la espalda. Para estar ms cmodo, puede cambiar la ubicacin de las manos. 4. Mantenga esta posicin durante 5segundos. 5. Lentamente vuelva a la posicin horizontal. Puentes Repita estos pasos 10veces seguidas: 1. Acustese boca arriba sobre una superficie firme. 2. Flexione las rodillas de manera que apunten al techo. Los pies deben estar apoyados en el suelo. 3. Contraiga los glteos y despegue las nalgas del suelo hasta que la cintura est casi a la altura de las rodillas. Si no siente el trabajo muscular en las nalgas y la parte posterior de los muslos, aleje los pies 1 o 2pulgadas (2,5 o 5centmetros) de las nalgas. 4. Mantenga esta posicin durante 3 a 5segundos. 5. Lentamente, vuelva a apoyar las nalgas en el suelo y relaje los glteos. Si este ejercicio le resulta muy fcil, intente realizarlo con los brazos cruzados sobre el pecho. Abdominales Repita estos pasos 5 o   10veces seguidas: 1. Acustese boca arriba sobre una cama dura o sobre el suelo con las piernas extendidas. 2. Flexione las rodillas de manera que apunten al techo. Los pies deben estar apoyados en el suelo. 3. Cruce los brazos sobre el pecho. 4. Baje levemente el mentn en direccin al pecho, pero no doble el cuello. 5. Contraiga los msculos del abdomen y con lentitud eleve el pecho lo suficiente como para  despegar levemente los omplatos del suelo. 6. Lentamente baje el pecho y la cabeza hasta el suelo. Elevaciones de espalda Repita estos pasos 5 o 10veces seguidas: 1. Acustese boca abajo con los brazos a los costados y apoye la frente en el suelo. 2. Contraiga los msculos de las piernas y los glteos. 3. Lentamente despegue el pecho del suelo mientras mantiene las caderas apoyadas en el suelo. Mantenga la nuca alineada con la curvatura de la espalda. Mire hacia el suelo mientras hace este ejercicio. 4. Mantenga esta posicin durante 3 a 5segundos. 5. Lentamente baje el pecho y el rostro hasta el suelo. SOLICITE AYUDA SI:  El dolor de espalda se vuelve mucho ms intenso cuando hace un ejercicio.  El dolor de espalda no se alivia 2horas despus de hacer los ejercicios. Si tiene alguno de estos problemas, deje de hacer los ejercicios. No vuelva a hacer los ejercicios a menos que el mdico lo autorice. SOLICITE AYUDA DE INMEDIATO SI:  Siente un dolor sbito y muy intenso en la espalda. Si esto ocurre, deje de hacer los ejercicios. No vuelva a hacer los ejercicios a menos que el mdico lo autorice. Esta informacin no tiene como fin reemplazar el consejo del mdico. Asegrese de hacerle al mdico cualquier pregunta que tenga. Document Released: 01/07/2011 Document Revised: 01/14/2016 Document Reviewed: 11/16/2014 Elsevier Interactive Patient Education  2018 Elsevier Inc.  

## 2017-05-19 NOTE — Progress Notes (Signed)
Patient ID: Theresa Barry, female    DOB: 1976/01/24  MRN: [id number]  CC: New Patient (Initial Visit); Abdominal Pain; and Constipation   Subjective: Theresa Barry is a 41 y.o. female who presents for new pt visit. Her concerns today include:   C/o pain across  lower back x 3 mths -intermittent, lasting 2-3 days when it comes on.  "Like a tiredness to the point where I have to hold myself against something." -no radiation down legs.  -some intermittent numbness in both legs. No weakness. No loss bowel or bladder function -nothing makes it worse but later she endorses standing is making it worse -taking Ibuprofen Q 8 hrs when needed. Helps. Walking makes the pain better  Also c/o abdominal pain on either side of lower abdomen. Occurs about Q 2 wks and can last for 3 days.  N/V/diarrhea.  No better or worse with food. -moving bowels ok.  -No abnormal vaginal discharge or itching at this time. Pain is no better or worse with intercourse -pt is s/p hyst and BL salpingo.    Patient Active Problem List   Diagnosis Date Noted  . Menorrhagia with regular cycle 04/19/2015  . 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 Bleeding from cervical fibroid 05/16/2013  . LIPOMA 08/16/2007  . INSOMNIA UNSPECIFIED 08/16/2007  . COUGH 08/16/2007     Current Outpatient Prescriptions on File Prior to Visit  Medication Sig Dispense Refill  . ibuprofen (ADVIL,MOTRIN) 800 MG tablet Take 800 mg by mouth every 8 (eight) hours as needed for mild pain or moderate pain.    . Prenatal Vit-Fe Fumarate-FA (MULTIVITAMIN-PRENATAL) 27-0.8 MG TABS tablet Take 1 tablet by mouth daily.      No current facility-administered medications on file prior to visit.     No Known Allergies  Social History   Social History  . Marital status: Married    Spouse name: N/A  . Number of children: N/A  . Years of education: N/A   Occupational History  . Not on file.   Social History Main  Topics  . Smoking status: Never Smoker  . Smokeless tobacco: Never Used  . Alcohol use No  . Drug use: No  . Sexual activity: Yes    Birth control/ protection: None   Other Topics Concern  . Not on file   Social History Narrative  . No narrative on file    Family History  Problem Relation Age of Onset  . Diabetes Mother   . Diabetes Brother   . Breast cancer Paternal Aunt   . Breast cancer Paternal Aunt     Past Surgical History:  Procedure Laterality Date  . ABDOMINAL HYSTERECTOMY    . BILATERAL SALPINGECTOMY Bilateral 12/18/2015   Procedure: BILATERAL SALPINGECTOMY;  Surgeon: Woodroe Mode, MD;  Location: [facility];  Service: Gynecology;  Laterality: Bilateral;  . fibroidectomy     vaginally  . HYSTEROSCOPY W/D&C N/A 05/16/2013   Procedure: DILATATION AND CURETTAGE /HYSTEROSCOPY  Removal of [name];  Surgeon: Osborne Oman, MD;  Location: [facility];  Service: Gynecology;  Laterality: N/A;  . OVARIAN CYST REMOVAL    . VAGINAL HYSTERECTOMY N/A 12/18/2015   Procedure: HYSTERECTOMY VAGINAL;  Surgeon: Woodroe Mode, MD;  Location: [facility];  Service: Gynecology;  Laterality: N/A;    ROS: Review of Systems Negative except as stated above PHYSICAL EXAM: BP 102/69   Pulse 75   Temp 98.4 F (36.9 C) (Oral)   Resp 16   Wt 173  lb (78.5 kg)   LMP  (LMP Unknown) Comment: bleeding since December  SpO2 98%   BMI 33.79 kg/m   Physical Exam General appearance - alert, well appearing, and in no distress Mental status - alert, oriented to person, place, and time, normal mood, behavior, speech, dress, motor activity, and thought processes Abdomen - soft, nontender, nondistended, no masses or organomegaly Neurological - motor and sensory grossly normal bilaterally, gait normal.  Musculoskeletal - mild tenderness on palpation of the lumbar spine upper  Results for orders placed or performed during the hospital encounter of 04/02/17  Glucose, capillary  Result Value Ref  Range   Glucose-Capillary 90 65 - 99 mg/dL    ASSESSMENT AND PLAN: 1. Chronic radicular pain of lower back -possible pince nerve vs DJD or DDD causing nerve irritation -Discussed back exercises to help with chronic back pain Recommended use of a heating pad when necessary Voltaren gel to use when necessary. Robaxin when necessary. Patient informed that med can cause drowsiness - DG Lumbar Spine Complete; Future - diclofenac sodium (VOLTAREN) 1 % GEL; Apply 2 g topically 4 (four) times daily.  Dispense: 100 g; Refill: 0 - methocarbamol (ROBAXIN) 500 MG tablet; Take 1 tablet (500 mg total) by mouth 2 (two) times daily as needed for muscle spasms.  Dispense: 30 tablet; Refill: 0  2. Lower abdominal pain -Questionable etiology. Exam today is benign. Observe for now and do further workup if becomes more frequent   Patient was given the opportunity to ask questions.  Patient verbalized understanding of the plan and was able to repeat key elements of the plan.  [employer] interpreter used during this encounter.  Orders Placed This Encounter  Procedures  . DG Lumbar Spine Complete  . POCT urinalysis dipstick     Requested Prescriptions   Signed Prescriptions Disp Refills  . diclofenac sodium (VOLTAREN) 1 % GEL 100 g 0    Sig: Apply 2 g topically 4 (four) times daily.  . methocarbamol (ROBAXIN) 500 MG tablet 30 tablet 0    Sig: Take 1 tablet (500 mg total) by mouth 2 (two) times daily as needed for muscle spasms.    Return if symptoms worsen or fail to improve.  Karle Plumber, MD, FACP

## 2017-05-20 MED FILL — METHOCARBAMOL 500 MG TABS: 500 | 15 days supply | Qty: 30 | Fill #0

## 2017-06-17 ENCOUNTER — Ambulatory Visit: Payer: Self-pay | Attending: Internal Medicine | Admitting: Physician Assistant

## 2017-06-17 ENCOUNTER — Encounter: Payer: Self-pay | Admitting: Physician Assistant

## 2017-06-17 VITALS — BP 108/76 | HR 70 | Temp 98.5°F | Resp 18 | Ht 63.0 in | Wt 167.4 lb

## 2017-06-17 DIAGNOSIS — H539 Unspecified visual disturbance: Secondary | ICD-10-CM

## 2017-06-17 DIAGNOSIS — D259 Leiomyoma of uterus, unspecified: Secondary | ICD-10-CM | POA: Insufficient documentation

## 2017-06-17 DIAGNOSIS — M069 Rheumatoid arthritis, unspecified: Secondary | ICD-10-CM | POA: Insufficient documentation

## 2017-06-17 DIAGNOSIS — E119 Type 2 diabetes mellitus without complications: Secondary | ICD-10-CM | POA: Insufficient documentation

## 2017-06-17 DIAGNOSIS — R5383 Other fatigue: Secondary | ICD-10-CM | POA: Insufficient documentation

## 2017-06-17 DIAGNOSIS — M2550 Pain in unspecified joint: Secondary | ICD-10-CM | POA: Insufficient documentation

## 2017-06-17 DIAGNOSIS — D649 Anemia, unspecified: Secondary | ICD-10-CM | POA: Insufficient documentation

## 2017-06-17 MED ORDER — MELOXICAM 15 MG PO TABS: 15.0000 mg | ORAL_TABLET | Freq: Every day | ORAL | 0 refills | Status: DC

## 2017-06-17 MED ORDER — MELOXICAM 15 MG PO TABS: 15.0000 mg | ORAL_TABLET | Freq: Every day | ORAL | 3 refills | Status: DC

## 2017-06-17 NOTE — Progress Notes (Signed)
Patient ID: Theresa Barry, female   DOB: 1976-03-02, 41 y.o.   MRN: [id number]      Theresa Barry, is a 41 y.o. female  CVE:[id number]  [id number]:[id number]  DOB - 11/19/1975  Subjective:  Chief Complaint and HPI: Theresa Barry is a 41 y.o. female here today for diffuse joint pain that is worse in the morning and sometimes improves a little throughout the day.  Ibuprofen helps some.  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 Patient is c/o B knees and LBP and all of her joints for several months.  No tick bites.  No f/c. +fatigue.  +weight loss.  S/p hysterectomy so no periods.   No labs in a while.  No swelling of her joints.  Also, needs to see an eye doctor because having some blurry vision.  ROS:   Constitutional:  No f/c, No night sweats, No unexplained weight loss. EENT:  No vision changes, + blurry vision, No hearing changes. No mouth, throat, or ear problems.  Respiratory: No cough, No SOB Cardiac: No CP, no palpitations GI:  No abd pain, No N/V/D. GU: No Urinary s/sx Musculoskeletal: + diffuse joint pain with knees being the worst. Neuro: No headache, no dizziness, no motor weakness.  Skin: No rash Endocrine:  No polydipsia. No polyuria.  Psych: Denies SI/HI  No problems updated.  ALLERGIES: No Known Allergies  PAST MEDICAL HISTORY: Past Medical History:  Diagnosis Date  . Anemia   . Diabetes mellitus without complication ([place])   . Fibroid   . Medical history non-contributory     MEDICATIONS AT HOME: Prior to Admission medications   Medication Sig Start Date End Date Taking? Authorizing Provider  diclofenac sodium (VOLTAREN) 1 % GEL Apply 2 g topically 4 (four) times daily. 05/19/17  Yes Ladell Pier, MD  Prenatal Vit-Fe Fumarate-FA (MULTIVITAMIN-PRENATAL) 27-0.8 MG TABS tablet Take 1 tablet by mouth daily.    Yes [provider]  meloxicam (MOBIC) 15 MG tablet Take 1 tablet (15 mg total) by mouth daily. Prn pain 06/17/17    Argentina Donovan, PA-C  methocarbamol (ROBAXIN) 500 MG tablet Take 1 tablet (500 mg total) by mouth 2 (two) times daily as needed for muscle spasms. Patient not taking: Reported on 06/17/2017 05/19/17   Ladell Pier, MD     Objective:  EXAM:   Vitals:   06/17/17 1547  BP: 108/76  Pulse: 70  Resp: 18  Temp: 98.5 F (36.9 C)  TempSrc: Oral  SpO2: 97%  Weight: 167 lb 6.4 oz (75.9 kg)  Height: 5\' 3"  (1.6 m)    General appearance : A&OX3. NAD. Non-toxic-appearing HEENT: Atraumatic and Normocephalic.  PERRLA. EOM intact.  TM clear B. Mouth-MMM, post pharynx WNL w/o erythema, No PND. Neck: supple, no JVD. No cervical lymphadenopathy. No thyromegaly Chest/Lungs:  Breathing-non-labored, Good air entry bilaterally, breath sounds normal without rales, rhonchi, or wheezing  CVS: S1 S2 regular, no murmurs, gallops, rubs  Extremities: Bilateral Lower Ext shows no edema, both legs are warm to touch with = pulse throughout.  No swelling of any joints.  B knees are stable w/o laxity. There is some mild crepitus B. Neurology:  CN II-XII grossly intact, Non focal.   Psych:  TP linear. J/I WNL. Normal speech. Appropriate eye contact and affect.  Skin:  No Rash  Data Review No results found for: HGBA1C   Assessment & Plan   1. Arthralgia, unspecified joint Maybe PF syndrome of B knees - Sedimentation Rate - Vitamin D, 25-hydroxy - Rheumatoid  factor - meloxicam (MOBIC) 15 MG tablet; Take 1 tablet (15 mg total) by mouth daily. Prn pain  Dispense: 30 tablet; Refill: 3  2. Fatigue, unspecified type - TSH - CBC with Differential/Platelet  3. Vision changes - Ambulatory referral to Ophthalmology  Patient have been counseled extensively about nutrition and exercise  Return if symptoms worsen or fail to improve.  The patient was given clear instructions to go to ER or return to medical center if symptoms don't improve, worsen or new problems develop. The patient verbalized  understanding. The patient was told to call to get lab results if they haven't heard anything in the next week.     Freeman Caldron, PA-C [facility] and [place], [place]   06/17/2017, 4:14 PM

## 2017-06-18 LAB — VITAMIN D 25 HYDROXY (VIT D DEFICIENCY, FRACTURES): Vit D, 25-Hydroxy: 32.5 ng/mL (ref 30.0–100.0)

## 2017-06-18 LAB — CBC WITH DIFFERENTIAL/PLATELET
BASOS: 0 %
Basophils Absolute: 0 10*3/uL (ref 0.0–0.2)
EOS (ABSOLUTE): 0.1 10*3/uL (ref 0.0–0.4)
EOS: 2 %
HEMOGLOBIN: 12.4 g/dL (ref 11.1–15.9)
Hematocrit: 39 % (ref 34.0–46.6)
IMMATURE GRANS (ABS): 0 10*3/uL (ref 0.0–0.1)
IMMATURE GRANULOCYTES: 0 %
LYMPHS: 34 %
Lymphocytes Absolute: 2.3 10*3/uL (ref 0.7–3.1)
MCH: 27.5 pg (ref 26.6–33.0)
MCHC: 31.8 g/dL (ref 31.5–35.7)
MCV: 87 fL (ref 79–97)
MONOCYTES: 8 %
Monocytes Absolute: 0.5 10*3/uL (ref 0.1–0.9)
NEUTROS ABS: 3.9 10*3/uL (ref 1.4–7.0)
NEUTROS PCT: 56 %
PLATELETS: 324 10*3/uL (ref 150–379)
RBC: 4.51 x10E6/uL (ref 3.77–5.28)
RDW: 14.3 % (ref 12.3–15.4)
WBC: 6.9 10*3/uL (ref 3.4–10.8)

## 2017-06-18 LAB — SEDIMENTATION RATE: Sed Rate: 36 mm/hr — ABNORMAL HIGH (ref 0–32)

## 2017-06-18 LAB — RHEUMATOID FACTOR: Rhuematoid fact SerPl-aCnc: <10 IU/mL (ref 0.0–13.9)

## 2017-06-18 LAB — TSH: TSH: 2.49 u[IU]/mL (ref 0.450–4.500)

## 2017-06-23 ENCOUNTER — Telehealth: Payer: Self-pay | Admitting: *Deleted

## 2017-06-23 NOTE — Telephone Encounter (Signed)
Medical Assistant used [employer] to contact patient.  Interpreter Name: Tilda Burrow Interpreter #: [id number] Patient was not available, [employer] left patient a voicemail. Patient is aware of labs being normal and advised to increase water and exercise. Patient is advised to limit fat intake and to take medications as prescribed to help with joint pain. Patient should follow up as planned.

## 2017-06-23 NOTE — Telephone Encounter (Signed)
-----   Message from Argentina Donovan, [place] sent at 06/18/2017  8:22 AM EDT ----- Please call patient.  All of her labs were normal.  I recommend regular exercise and stretching, healthy diet, increased water intake, and a multi-vitamin.  This should help with joint pain. Use the meds I prescribed as needed and f/up if symptoms worsen or don't begin to improve. Thanks, Freeman Caldron, PA-C

## 2017-07-02 ENCOUNTER — Ambulatory Visit: Payer: Self-pay | Attending: Internal Medicine

## 2017-10-04 ENCOUNTER — Encounter (HOSPITAL_COMMUNITY): Payer: Self-pay | Admitting: *Deleted

## 2017-10-04 ENCOUNTER — Other Ambulatory Visit: Payer: Self-pay

## 2017-10-04 ENCOUNTER — Ambulatory Visit (HOSPITAL_COMMUNITY)
Admission: EM | Admit: 2017-10-04 | Discharge: 2017-10-04 | Disposition: A | Discharge status: Home / Self Care | Payer: Self-pay | Attending: Internal Medicine | Admitting: Internal Medicine

## 2017-10-04 DIAGNOSIS — K529 Noninfective gastroenteritis and colitis, unspecified: Secondary | ICD-10-CM

## 2017-10-04 NOTE — ED Triage Notes (Signed)
Describes dizziness "floor moving", HA, fatigue, "cold" in BUE, diarrhea, abdominal gurgling without fevers x 3 days.  Denies n/v.

## 2017-10-04 NOTE — Discharge Instructions (Addendum)
No danger signs on exam today.  Would anticipate gradual improvement in stool frequency and consistency over the next several days.  Nausea and headache should improve as well.  Push fluids and rest.  Recheck or follow-up with your primary care provider if not improving as expected.

## 2017-10-04 NOTE — ED Provider Notes (Signed)
[place]    CSN: [id number] Arrival date & time: 10/04/17  1811     History   Chief Complaint Chief Complaint  Patient presents with  . Dizziness  . Diarrhea  . Headache    HPI Theresa Barry is a 41 y.o. female.   She presents today with 3-day history of soft stools, increased frequency from baseline.  She has had 3 stools today.  No blood.  Little bit of nausea, some headache, and a feeling of malaise/dizziness that comes and goes.  The dizziness has not caused her to fall down.  It is not particularly worse on change of position.  No fever.    HPI  Past Medical History:  Diagnosis Date  . Anemia   . Diabetes mellitus without complication (HCC)    elevated Hgb A1C  . Fibroid     Patient Active Problem List   Diagnosis Date Noted  . Chronic radicular pain of lower back 05/19/2017  . 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 LIPOMA 08/16/2007    Past Surgical History:  Procedure Laterality Date  . ABDOMINAL HYSTERECTOMY    . BILATERAL SALPINGECTOMY Bilateral 12/18/2015   Procedure: BILATERAL SALPINGECTOMY;  Surgeon: Woodroe Mode, MD;  Location: [facility];  Service: Gynecology;  Laterality: Bilateral;  . fibroidectomy     vaginally  . HYSTEROSCOPY W/D&C N/A 05/16/2013   Procedure: DILATATION AND CURETTAGE /HYSTEROSCOPY  Removal of [name];  Surgeon: Osborne Oman, MD;  Location: [facility];  Service: Gynecology;  Laterality: N/A;  . OVARIAN CYST REMOVAL    . VAGINAL HYSTERECTOMY N/A 12/18/2015   Procedure: HYSTERECTOMY VAGINAL;  Surgeon: Woodroe Mode, MD;  Location: [facility];  Service: Gynecology;  Laterality: N/A;    OB History    Gravida Para Term Preterm AB Living   5 3 3   1 3    SAB TAB Ectopic Multiple Live Births   1     1 3        Home Medications    Prior to Admission medications   Medication Sig Start Date End Date Taking? Authorizing Provider  Prenatal Vit-Fe Fumarate-FA (MULTIVITAMIN-PRENATAL) 27-0.8 MG TABS  tablet Take 1 tablet by mouth daily.    Yes [provider]  diclofenac sodium (VOLTAREN) 1 % GEL Apply 2 g topically 4 (four) times daily. 05/19/17   Ladell Pier, MD  meloxicam (MOBIC) 15 MG tablet Take 1 tablet (15 mg total) by mouth daily. Prn pain 06/17/17   Argentina Donovan, PA-C  methocarbamol (ROBAXIN) 500 MG tablet Take 1 tablet (500 mg total) by mouth 2 (two) times daily as needed for muscle spasms. Patient not taking: Reported on 06/17/2017 05/19/17   Ladell Pier, MD    Family History Family History  Problem Relation Age of Onset  . Diabetes Mother   . Diabetes Brother   . Breast cancer Paternal Aunt   . Breast cancer Paternal Aunt     Social History Social History   Tobacco Use  . Smoking status: Never Smoker  . Smokeless tobacco: Never Used  Substance Use Topics  . Alcohol use: No  . Drug use: No     Allergies   Patient has no known allergies.   Review of Systems Review of Systems  All other systems reviewed and are negative.    Physical Exam Triage Vital Signs ED Triage Vitals [10/04/17 1954]  Enc Vitals Group     BP 125/77     Pulse Rate 74  Resp 20     Temp 97.7 F (36.5 C)     Temp Source Oral     SpO2 100 %     Weight      Height    No data found.  Updated Vital Signs BP 125/77 (BP Location: Left Arm)   Pulse 74   Temp 97.7 F (36.5 C) (Oral)   Resp 20   LMP  (LMP Unknown)   SpO2 100%  Physical Exam  Constitutional: She is oriented to person, place, and time. No distress.  Nicely groomed, no distress.  Sitting up on the end of the exam table  HENT:  Head: Atraumatic.  Eyes:  Conjugate gaze observed, no eye redness/discharge  Neck: Neck supple.  Cardiovascular: Normal rate and regular rhythm.  Pulmonary/Chest: No respiratory distress. She has no wheezes. She has no rales.  Lungs clear, symmetric breath sounds   Abdominal: Soft. She exhibits no distension. There is no tenderness. There is no rebound and no  guarding.  Musculoskeletal: Normal range of motion. She exhibits no edema.  Neurological: She is alert and oriented to person, place, and time.  Skin: Skin is warm and dry.  Nursing note and vitals reviewed.    UC Treatments / Results   Procedures Procedures (including critical care time) None today  Final Clinical Impressions(s) / UC Diagnoses   Final diagnoses:  Acute gastroenteritis   No danger signs on exam today.  Would anticipate gradual improvement in stool frequency and consistency over the next several days.  Nausea and headache should improve as well.  Push fluids and rest.  Recheck or follow-up with your primary care provider if not improving as expected.    Wynona Luna, MD 10/06/17 [phone]

## 2017-11-19 ENCOUNTER — Ambulatory Visit: Payer: Self-pay | Attending: Internal Medicine | Admitting: Physician Assistant

## 2017-11-19 ENCOUNTER — Other Ambulatory Visit: Payer: Self-pay

## 2017-11-19 VITALS — BP 126/87 | HR 61 | Temp 98.7°F | Resp 12 | Ht 63.0 in | Wt 168.8 lb

## 2017-11-19 DIAGNOSIS — E119 Type 2 diabetes mellitus without complications: Secondary | ICD-10-CM | POA: Insufficient documentation

## 2017-11-19 DIAGNOSIS — R112 Nausea with vomiting, unspecified: Secondary | ICD-10-CM | POA: Insufficient documentation

## 2017-11-19 DIAGNOSIS — Z789 Other specified health status: Secondary | ICD-10-CM

## 2017-11-19 DIAGNOSIS — R059 Cough, unspecified: Secondary | ICD-10-CM

## 2017-11-19 DIAGNOSIS — R1032 Left lower quadrant pain: Secondary | ICD-10-CM | POA: Insufficient documentation

## 2017-11-19 DIAGNOSIS — R5383 Other fatigue: Secondary | ICD-10-CM | POA: Insufficient documentation

## 2017-11-19 DIAGNOSIS — R05 Cough: Secondary | ICD-10-CM | POA: Insufficient documentation

## 2017-11-19 DIAGNOSIS — L659 Nonscarring hair loss, unspecified: Secondary | ICD-10-CM

## 2017-11-19 DIAGNOSIS — R110 Nausea: Secondary | ICD-10-CM

## 2017-11-19 LAB — POCT URINALYSIS DIPSTICK
APPEARANCE: NORMAL
Bilirubin, UA: NEGATIVE
Glucose, UA: NEGATIVE
KETONES UA: NEGATIVE
Leukocytes, UA: NEGATIVE
NITRITE UA: NEGATIVE
PH UA: 7 (ref 5.0–8.0)
PROTEIN UA: NEGATIVE
RBC UA: NEGATIVE
Spec Grav, UA: 1.015 (ref 1.010–1.025)
UROBILINOGEN UA: 0.2 U/dL

## 2017-11-19 MED ORDER — BENZONATATE 100 MG PO CAPS: 200.0000 mg | ORAL_CAPSULE | Freq: Three times a day (TID) | ORAL | 0 refills | Status: DC | PRN

## 2017-11-19 MED ORDER — ONDANSETRON HCL 4 MG PO TABS
4.0000 mg | ORAL_TABLET | Freq: Three times a day (TID) | ORAL | 0 refills | Status: DC | PRN
Start: 2017-11-19 — End: 2017-11-23

## 2017-11-19 MED FILL — ONDANSETRON HCL 4 MG TABLET: 4 | 7 days supply | Qty: 20 | Fill #0

## 2017-11-19 MED FILL — BENZONATATE 100 MG CAPSULE: 100 | 7 days supply | Qty: 40 | Fill #0

## 2017-11-19 NOTE — Progress Notes (Signed)
Hair thinning and falling out. Very tired  URI- cough x 4 days  LLQ pain greater that 1 week

## 2017-11-19 NOTE — Progress Notes (Signed)
Patient ID: Theresa Barry, female   DOB: 1975/11/23, 42 y.o.   MRN: [id number]      Theresa Barry, is a 42 y.o. female  HBZ:[id number]  [id number]:[id number]  DOB - 1976/01/31  Subjective:  Chief Complaint and HPI: Theresa Barry is a 42 y.o. female here today for multiple concerns.  Cough X 4 days.  Mostly non-productive.  No fever, initially, she had a ST but that has resolved.  No sneezing, runny nose.    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  She has had a hysterectomy and is no longer having periods.  No major changes to schedule  Pain in LLQ X 1 week.  Burning pain that radiates into her back. No dysuria.  + nausea, no V/D. No rash.  Pain is intermittent.  Mild in nature.  No change in BM.  No melena or hematochezia.    +family history of thyroid disorder and diabetes in her mom  ROS:   Constitutional:  No f/c, No night sweats, No unexplained weight loss.  + fatigue and hair loss EENT:  No vision changes, No blurry vision, No hearing changes. No other mouth, throat, or ear problems.  Respiratory: + cough, No SOB Cardiac: No CP, no palpitations GI: +L flank pain, +nausea, no V/D. GU: No Urinary s/sx Musculoskeletal: No joint pain Neuro: No headache, no dizziness, no motor weakness.  Skin: No rash Endocrine:  No polydipsia. No polyuria.  Psych: Denies SI/HI  No problems updated.  ALLERGIES: No Known Allergies  PAST MEDICAL HISTORY: Past Medical History:  Diagnosis Date  . Anemia   . Diabetes mellitus without complication (HCC)    elevated Hgb A1C  . Fibroid     MEDICATIONS AT HOME: Prior to Admission medications   Medication Sig Start Date End Date Taking? Authorizing Provider  Prenatal Vit-Fe Fumarate-FA (MULTIVITAMIN-PRENATAL) 27-0.8 MG TABS tablet Take 1 tablet by mouth daily.    Yes [provider]  benzonatate (TESSALON) 100 MG capsule Take 2 capsules (200 mg total) by mouth 3 (three)  times daily as needed for cough. 11/19/17   Argentina Donovan, PA-C  ondansetron (ZOFRAN) 4 MG tablet Take 1 tablet (4 mg total) by mouth every 8 (eight) hours as needed for nausea or vomiting. 11/19/17   Argentina Donovan, PA-C     Objective:  EXAM:   Vitals:   11/19/17 0908  BP: 126/87  Pulse: 61  Resp: 12  Temp: 98.7 F (37.1 C)  TempSrc: Oral  Weight: 168 lb 12.8 oz (76.6 kg)  Height: 5\' 3"  (1.6 m)    General appearance : A&OX3. NAD. Non-toxic-appearing HEENT: Atraumatic and Normocephalic.  PERRLA. EOM intact.  TM clear B. Mouth-MMM, post pharynx WNL w/o erythema, No PND. Neck: supple, no JVD. No cervical lymphadenopathy. No thyromegaly Chest/Lungs:  Breathing-non-labored, Good air entry bilaterally, breath sounds normal without rales, rhonchi, or wheezing  CVS: S1 S2 regular, no murmurs, gallops, rubs  Abdomen: Bowel sounds present, Non tender and not distended with no gaurding, rigidity or rebound. Extremities: Bilateral Lower Ext shows no edema, both legs are warm to touch with = pulse throughout Neurology:  CN II-XII grossly intact, Non focal.   Psych:  TP linear. J/I WNL. Normal speech. Appropriate eye contact and affect.  Skin:  No Rash-no sign of shingles  Data Review No results found for: HGBA1C   Assessment & Plan   1. LLQ pain/L flank pain-urine is normal-watch for rash in the event this is shingles  prodrome Non -acute abdomen - Urinalysis Dipstick - Comprehensive metabolic panel  2. Hair loss(+FH thyroid d/o) - TSH  3. Fatigue, unspecified type(+FH thyroid d/o) - TSH - Vitamin D, 25-hydroxy - Comprehensive metabolic panel  4. Cough Viral URI likely, no sign bacterial cause - benzonatate (TESSALON) 100 MG capsule; Take 2 capsules (200 mg total) by mouth 3 (three) times daily as needed for cough.  Dispense: 40 capsule; Refill: 0 - ondansetron (ZOFRAN) 4 MG tablet; Take 1 tablet (4 mg total) by mouth every 8 (eight) hours as needed for nausea or  vomiting.  Dispense: 20 tablet; Refill: 0  5. Nausea - ondansetron (ZOFRAN) 4 MG tablet; Take 1 tablet (4 mg total) by mouth every 8 (eight) hours as needed for nausea or vomiting.  Dispense: 20 tablet; Refill: 0  6. Language barrier [employer] interpre[employer] used and additional time performing visit was required.   Patient have been counseled extensively about nutrition and exercise  Return in about 1 month (around 12/17/2017) for Dr Wynetta Emery; f/up fatigue; hair loss, etc.  The patient was given clear instructions to go to ER or return to medical center if symptoms don't improve, worsen or new problems develop. The patient verbalized understanding. The patient was told to call to get lab results if they haven't heard anything in the next week.     Freeman Caldron, PA-C [facility] and [facility] [place], [place]   11/19/2017, 9:37 AM

## 2017-11-20 LAB — COMPREHENSIVE METABOLIC PANEL
ALBUMIN: 4.4 g/dL (ref 3.5–5.5)
ALK PHOS: 100 IU/L (ref 39–117)
ALT: 30 IU/L (ref 0–32)
AST: 33 IU/L (ref 0–40)
Albumin/Globulin Ratio: 1.6 (ref 1.2–2.2)
BILIRUBIN TOTAL: 0.6 mg/dL (ref 0.0–1.2)
BUN / CREAT RATIO: 20 (ref 9–23)
BUN: 12 mg/dL (ref 6–24)
CO2: 20 mmol/L (ref 20–29)
Calcium: 9 mg/dL (ref 8.7–10.2)
Chloride: 104 mmol/L (ref 96–106)
Creatinine, Ser: 0.59 mg/dL (ref 0.57–1.00)
GFR calc Af Amer: 129 mL/min/{1.73_m2} (ref >59)
GFR calc non Af Amer: 112 mL/min/{1.73_m2} (ref >59)
GLOBULIN, TOTAL: 2.7 g/dL (ref 1.5–4.5)
Glucose: 100 mg/dL — ABNORMAL HIGH (ref 65–99)
POTASSIUM: 4 mmol/L (ref 3.5–5.2)
SODIUM: 139 mmol/L (ref 134–144)
Total Protein: 7.1 g/dL (ref 6.0–8.5)

## 2017-11-20 LAB — CBC WITH DIFFERENTIAL/PLATELET
BASOS ABS: 0 10*3/uL (ref 0.0–0.2)
Basos: 0 %
EOS (ABSOLUTE): 0.2 10*3/uL (ref 0.0–0.4)
EOS: 3 %
HEMATOCRIT: 39.6 % (ref 34.0–46.6)
Hemoglobin: 12.8 g/dL (ref 11.1–15.9)
Immature Grans (Abs): 0 10*3/uL (ref 0.0–0.1)
Immature Granulocytes: 0 %
LYMPHS ABS: 1.9 10*3/uL (ref 0.7–3.1)
Lymphs: 30 %
MCH: 27.8 pg (ref 26.6–33.0)
MCHC: 32.3 g/dL (ref 31.5–35.7)
MCV: 86 fL (ref 79–97)
MONOS ABS: 0.5 10*3/uL (ref 0.1–0.9)
Monocytes: 8 %
NEUTROS PCT: 59 %
Neutrophils Absolute: 3.9 10*3/uL (ref 1.4–7.0)
PLATELETS: 318 10*3/uL (ref 150–379)
RBC: 4.61 x10E6/uL (ref 3.77–5.28)
RDW: 14.2 % (ref 12.3–15.4)
WBC: 6.5 10*3/uL (ref 3.4–10.8)

## 2017-11-20 LAB — TSH: TSH: 2.15 u[IU]/mL (ref 0.450–4.500)

## 2017-11-20 LAB — VITAMIN D 25 HYDROXY (VIT D DEFICIENCY, FRACTURES): Vit D, 25-Hydroxy: 32.7 ng/mL (ref 30.0–100.0)

## 2017-11-23 ENCOUNTER — Encounter (HOSPITAL_COMMUNITY): Payer: Self-pay | Admitting: Family Medicine

## 2017-11-23 ENCOUNTER — Ambulatory Visit (HOSPITAL_COMMUNITY)
Admission: EM | Admit: 2017-11-23 | Discharge: 2017-11-23 | Disposition: A | Discharge status: Home / Self Care | Payer: Self-pay | Attending: Family Medicine | Admitting: Family Medicine

## 2017-11-23 DIAGNOSIS — K5289 Other specified noninfective gastroenteritis and colitis: Secondary | ICD-10-CM

## 2017-11-23 MED ORDER — HYOSCYAMINE SULFATE SL 0.125 MG SL SUBL: 1.0000 | SUBLINGUAL_TABLET | Freq: Three times a day (TID) | SUBLINGUAL | 1 refills | Status: DC | PRN

## 2017-11-23 MED ORDER — RANITIDINE HCL 150 MG PO TABS: 150.0000 mg | ORAL_TABLET | Freq: Every day | ORAL | 0 refills | Status: DC

## 2017-11-23 NOTE — ED Provider Notes (Signed)
[facility]   [id number] 11/23/17 Arrival Time: 59   SUBJECTIVE:  Theresa Barry is a 42 y.o. female who presents to the urgent care with complaint of diarrhea, borborygmi, and bloating x 36 hours.  Diarrhea is liquid and she has noted some blood on tissue after wiping.  Also, some nausea but no vomiting.  She works in [employer] and had same symptoms last diciembre.     Past Medical History:  Diagnosis Date  . Anemia   . Diabetes mellitus without complication (HCC)    elevated Hgb A1C  . Fibroid    Family History  Problem Relation Age of Onset  . Diabetes Mother   . Diabetes Brother   . Breast cancer Paternal Aunt   . Breast cancer Paternal Aunt    Social History   Socioeconomic History  . Marital status: Married    Spouse name: Not on file  . Number of children: Not on file  . Years of education: Not on file  . Highest education level: Not on file  Social Needs  . Financial resource strain: Not on file  . Food insecurity - worry: Not on file  . Food insecurity - inability: Not on file  . Transportation needs - medical: Not on file  . Transportation needs - non-medical: Not on file  Occupational History  . Not on file  Tobacco Use  . Smoking status: Never Smoker  . Smokeless tobacco: Never Used  Substance and Sexual Activity  . Alcohol use: No  . Drug use: No  . Sexual activity: Yes    Birth control/protection: Surgical  Other Topics Concern  . Not on file  Social History Narrative  . Not on file   No outpatient medications have been marked as taking for the 11/23/17 encounter [facility] Encounter).   No Known Allergies    ROS: As per HPI, remainder of ROS negative.   OBJECTIVE:   Vitals:   11/23/17 1806 11/23/17 1807  BP: 116/70   Pulse: 77   Resp: 16   Temp: 97.9 F (36.6 C)   TempSrc: Oral   SpO2: 100%   Weight:  168 lb (76.2 kg)  Height:  5\' 3"  (1.6 m)     General appearance: alert; no distress Eyes:  PERRL; EOMI; conjunctiva normal HENT: normocephalic; atraumatic oral mucosa normal Neck: supple Lungs: clear to auscultation bilaterally Heart: regular rate and rhythm Abdomen: soft, mild diffuse tenderness-especially epigastrium; bowel sounds normal; no masses or organomegaly; no guarding or rebound tenderness Back: no CVA tenderness Extremities: no cyanosis or edema; symmetrical with no gross deformities Skin: warm and dry Neurologic: normal gait; grossly normal Psychological: alert and cooperative; normal mood and affect      Labs:  Results for orders placed or performed in visit on 11/19/17  TSH  Result Value Ref Range   TSH 2.150 0.450 - 4.500 uIU/mL  Vitamin D, 25-hydroxy  Result Value Ref Range   Vit D, 25-Hydroxy 32.7 30.0 - 100.0 ng/mL  Comprehensive metabolic panel  Result Value Ref Range   Glucose 100 (H) 65 - 99 mg/dL   BUN 12 6 - 24 mg/dL   Creatinine, Ser 0.59 0.57 - 1.00 mg/dL   GFR calc non Af Amer 112 >59 mL/min/1.73   GFR calc Af Amer 129 >59 mL/min/1.73   BUN/Creatinine Ratio 20 9 - 23   Sodium 139 134 - 144 mmol/L   Potassium 4.0 3.5 - 5.2 mmol/L   Chloride 104 96 - 106 mmol/L  CO2 20 20 - 29 mmol/L   Calcium 9.0 8.7 - 10.2 mg/dL   Total Protein 7.1 6.0 - 8.5 g/dL   Albumin 4.4 3.5 - 5.5 g/dL   Globulin, Total 2.7 1.5 - 4.5 g/dL   Albumin/Globulin Ratio 1.6 1.2 - 2.2   Bilirubin Total 0.6 0.0 - 1.2 mg/dL   Alkaline Phosphatase 100 39 - 117 IU/L   AST 33 0 - 40 IU/L   ALT 30 0 - 32 IU/L  CBC with Differential/Platelet  Result Value Ref Range   WBC 6.5 3.4 - 10.8 x10E3/uL   RBC 4.61 3.77 - 5.28 x10E6/uL   Hemoglobin 12.8 11.1 - 15.9 g/dL   Hematocrit 39.6 34.0 - 46.6 %   MCV 86 79 - 97 fL   MCH 27.8 26.6 - 33.0 pg   MCHC 32.3 31.5 - 35.7 g/dL   RDW 14.2 12.3 - 15.4 %   Platelets 318 150 - 379 x10E3/uL   Neutrophils 59 Not Estab. %   Lymphs 30 Not Estab. %   Monocytes 8 Not Estab. %   Eos 3 Not Estab. %   Basos 0 Not Estab. %    Neutrophils Absolute 3.9 1.4 - 7.0 x10E3/uL   Lymphocytes Absolute 1.9 0.7 - 3.1 x10E3/uL   Monocytes Absolute 0.5 0.1 - 0.9 x10E3/uL   EOS (ABSOLUTE) 0.2 0.0 - 0.4 x10E3/uL   Basophils Absolute 0.0 0.0 - 0.2 x10E3/uL   Immature Granulocytes 0 Not Estab. %   Immature Grans (Abs) 0.0 0.0 - 0.1 x10E3/uL  Urinalysis Dipstick  Result Value Ref Range   Color, UA yellow    Clarity, UA clear    Glucose, UA negative    Bilirubin, UA negative    Ketones, UA negative    Spec Grav, UA 1.015 1.010 - 1.025   Blood, UA negative    pH, UA 7.0 5.0 - 8.0   Protein, UA negative    Urobilinogen, UA 0.2 0.2 or 1.0 E.U./dL   Nitrite, UA negative    Leukocytes, UA Negative Negative   Appearance normal    Odor none     Labs Reviewed - No data to display  No results found.     ASSESSMENT & PLAN:  1. Other noninfectious gastroenteritis     Meds ordered this encounter  Medications  . Hyoscyamine Sulfate SL (LEVSIN/SL) 0.125 MG SUBL    Sig: Place 1 tablet under the tongue every 8 (eight) hours as needed.    Dispense:  12 each    Refill:  1  . ranitidine (ZANTAC) 150 MG tablet    Sig: Take 1 tablet (150 mg total) by mouth at bedtime.    Dispense:  10 tablet    Refill:  0    Reviewed expectations re: course of current medical issues. Questions answered. Outlined signs and symptoms indicating need for more acute intervention. Patient verbalized understanding. After Visit Summary given.    Procedures:      Robyn Haber, MD 11/23/17 [phone]

## 2017-11-23 NOTE — ED Triage Notes (Signed)
Patient triaged by provider.

## 2017-11-23 NOTE — ED Notes (Signed)
Triaged by provider  

## 2017-11-23 NOTE — Discharge Instructions (Signed)
Regressa si las sintomas continuan.

## 2017-11-26 ENCOUNTER — Telehealth: Payer: Self-pay | Admitting: Internal Medicine

## 2017-11-26 NOTE — Telephone Encounter (Signed)
Pt. Called requesting her lab results that she had sone on 11/19/17. Please f/u

## 2017-11-26 NOTE — Telephone Encounter (Signed)
I don't have any results for her will send to Carilyn Goodpasture looks like she worked with her on 11/19/17.

## 2017-11-26 NOTE — Telephone Encounter (Signed)
Dr. Wynetta Emery, would you release results for patient's lab test.

## 2017-12-01 ENCOUNTER — Telehealth: Payer: Self-pay | Admitting: Internal Medicine

## 2017-12-01 NOTE — Telephone Encounter (Signed)
Patient was called and given lab results by Rolm Gala.

## 2017-12-01 NOTE — Telephone Encounter (Signed)
Pt called to receive lab results, but nurse was not available. Please call pt back with lab results

## 2017-12-18 ENCOUNTER — Ambulatory Visit: Payer: Self-pay | Attending: Internal Medicine | Admitting: Internal Medicine

## 2017-12-18 ENCOUNTER — Encounter: Payer: Self-pay | Admitting: Internal Medicine

## 2017-12-18 VITALS — BP 108/73 | HR 54 | Temp 98.0°F | Resp 16 | Wt 169.8 lb

## 2017-12-18 DIAGNOSIS — M25541 Pain in joints of right hand: Secondary | ICD-10-CM | POA: Insufficient documentation

## 2017-12-18 DIAGNOSIS — Z1239 Encounter for other screening for malignant neoplasm of breast: Secondary | ICD-10-CM | POA: Insufficient documentation

## 2017-12-18 DIAGNOSIS — Z131 Encounter for screening for diabetes mellitus: Secondary | ICD-10-CM | POA: Insufficient documentation

## 2017-12-18 DIAGNOSIS — Z79899 Other long term (current) drug therapy: Secondary | ICD-10-CM | POA: Insufficient documentation

## 2017-12-18 DIAGNOSIS — Z8041 Family history of malignant neoplasm of ovary: Secondary | ICD-10-CM | POA: Insufficient documentation

## 2017-12-18 DIAGNOSIS — M2550 Pain in unspecified joint: Secondary | ICD-10-CM

## 2017-12-18 DIAGNOSIS — M25542 Pain in joints of left hand: Secondary | ICD-10-CM | POA: Insufficient documentation

## 2017-12-18 DIAGNOSIS — M545 Low back pain: Secondary | ICD-10-CM | POA: Insufficient documentation

## 2017-12-18 DIAGNOSIS — H538 Other visual disturbances: Secondary | ICD-10-CM | POA: Insufficient documentation

## 2017-12-18 DIAGNOSIS — M25562 Pain in left knee: Secondary | ICD-10-CM | POA: Insufficient documentation

## 2017-12-18 DIAGNOSIS — R7303 Prediabetes: Secondary | ICD-10-CM | POA: Insufficient documentation

## 2017-12-18 DIAGNOSIS — M25561 Pain in right knee: Secondary | ICD-10-CM | POA: Insufficient documentation

## 2017-12-18 LAB — POCT GLYCOSYLATED HEMOGLOBIN (HGB A1C): HEMOGLOBIN A1C: 5.8

## 2017-12-18 MED ORDER — NAPROXEN 500 MG PO TABS: 500.0000 mg | ORAL_TABLET | Freq: Two times a day (BID) | ORAL | 1 refills | Status: DC | PRN

## 2017-12-18 NOTE — Patient Instructions (Addendum)
Stop Ibuprofen. Start Naprosyn 500 mg twice a day.   Prediabetes (Prediabetes) QU ES LA PREDIABETES? La prediabetes es la enfermedad que presenta un nivel de azcar en la sangre (glucemia) ms alto de lo normal, pero no lo suficientemente alto como para que le diagnostiquen diabetes tipo2. El hecho de ser prediabtico lo pone en riesgo de desarrollar diabetes tipo2 (diabetes mellitus tipo2). La prediabetes tambin se puede llamar intolerancia a la glucosa o glucosa alterada en ayunas. Generalmente, la prediabetes no causa sntomas. El mdico puede diagnosticar esta enfermedad por los anlisis de [place]. Los anlisis para Hydrographic surveyor la prediabetes se pueden realizar si usted tiene sobrepeso y si presenta al menos un factor de riesgo ms de prediabetes. Entre los factores de riesgo de prediabetes, se incluyen los siguientes:  Best boy un familiar con diabetes tipo2.  Sobrepeso u obesidad.  Tener ms de 64 aos.  Ser descendiente de indgenas norteamericanos, afroamericanos, hispanos o latinos, o asiticos o isleos del Pacfico.  Tener un estilo de vida inactivo (sedentario).  Tener antecedentes de diabetes gestacional o sndrome de ovario poliqustico (SOP).  Tener niveles bajos del colesterol bueno (HDL-C) o niveles altos de grasas en la sangre (triglicridos).  Tener hipertensin arterial. QU ES LA GLUCEMIA Y CMO SE MIDE? La glucemia hace referencia a la cantidad de glucosa que tiene en el torrente sanguneo. La glucosa proviene de los alimentos que contienen azcar y almidn (carbohidratos) que el organismo descompone para formar glucosa. El nivel de glucemia se puede medir en mg/dl (miligramos por decilitro) o mmol/l (milimoles por litro).La glucemia puede controlarse con uno o ms de los siguientes anlisis de sangre:  Medicin de la glucemia en [place]. No se le permitir comer (tendr que [employer]) durante al menos 8horas antes de que se tome una [place] de [place]. ? Un  rango normal de glucemia en ayunas es de 70 a 100mg /dl (de 3,9 a 5,53mmol/l).  Un anlisis de sangre de A1c (hemoglobina A1c). Este anlisis proporciona informacin sobre el control de la glucemia durante los ltimos 2 o 64meses.  Prueba de tolerancia a la glucosa oral (PTGO). Esta prueba mide la glucemia dos veces: ? Despus del ayuno. Este es el valor inicial. ? Dos horas despus de ingerir una bebida que contiene glucosa. Pueden diagnosticarle prediabetes en los siguientes casos:  Si la glucemia en ayunas es de 100 a 125mg /dl (de 5,6 a 6,82mmol/l).  Si el nivel de A1c es del 5,7% al 6,4%.  Si el resultado de la PTGO es de 140 a 199mg /dl (de 7,8 a 42mmol/l). Estos anlisis de sangre se pueden repetir para Physicist, medical diagnstico. QU SUCEDE SI LA GLUCEMIA ES DEMASIADO ALTA? El pncreas produce una hormona (insulina) que ayuda a Civil Service fast streamer glucosa desde el torrente sanguneo hacia las clulas. Cuando las clulas no responden de forma [place] a la insulina que el organismo produce (resistencia a la insulina), el exceso de glucosa se acumula en la sangre en vez de dirigirse hacia las clulas. Como consecuencia, se puede desarrollar glucemia alta (hiperglucemia), que puede causar muchas complicaciones. Este es uno de los sntomas de la prediabetes. QU PUEDE SUCEDER SI LA GLUCEMIA PERMANECE MS ALTA DE LO NORMAL DURANTE MUCHO TIEMPO? Es peligroso Systems analyst glucemia alta durante mucho tiempo. Demasiada glucosa en la sangre puede daar los nervios y los vasos sanguneos. El dao a largo plazo puede provocar complicaciones de la diabetes, por ejemplo:  Cardiopata.  Ictus.  Ceguera.  Enfermedad renal.  Depresin.  Mala circulacin en los pies y en  las piernas, que podra llevar a la extraccin quirrgica (amputacin) en casos graves. CMO SE PUEDE EVITAR QUE LA PREDIABETES SE CONVIERTA EN DIABETES TIPO2? Para prevenir la diabetes tipo2, tome las siguientes medidas:  Haga  actividad fsica. ? Haga actividad fsica de intensidad moderada durante al menos 54minutos como mnimo 5das por [employer], o tanto como le haya indicado el mdico. Podra hacer caminatas dinmicas, ciclismo o [place]. ? Pregntele al mdico qu actividades son seguras para usted. Una combinacin de actividades puede ser la mejor opcin, por ejemplo, caminar, practicar natacin, andar en bicicleta y hacer entrenamiento de fuerza.  Baje de [employer] se lo haya indicado el mdico. ? Bajar entre el 5% y el 7% del peso corporal puede revertir la resistencia a la insulina. ? El mdico puede determinar cuntos kilos tiene que bajar y [place] a que adelgace de Geographical information systems officer segura.  Siga un plan de alimentacin saludable. Este incluye consumir protenas magras, hidratos de carbono complejos, frutas y verduras frescas, productos lcteos con bajo contenido de grasa y grasas saludables. ? Siga las indicaciones del mdico respecto de las restricciones para las comidas o las bebidas. ? Programe una cita con un especialista en alimentacin y nutricin (nutricionista certificado) para que lo ayude a Manufacturing engineer plan de alimentacin saludable adecuado para usted.  No fume ni consuma ningn producto que contenga tabaco, lo que incluye cigarrillos, tabaco de Higher education careers adviser y Psychologist, sport and exercise. Si necesita ayuda para dejar de fumar, consulte al [employer].  [employer] de venta libre y los recetados como se lo haya indicado el mdico. Es posible que le receten medicamentos que ayuden a disminuir el riesgo de tener diabetes tipo2. Esta informacin no tiene Marine scientist el consejo del mdico. Asegrese de hacerle al mdico cualquier pregunta que tenga. Document Released: 11/13/2015 Document Revised: 11/13/2015 Document Reviewed: 11/13/2015 Elsevier Interactive Patient Education  [employer].

## 2017-12-18 NOTE — Progress Notes (Signed)
Patient ID: Theresa Barry, female    DOB: 1976-02-02  MRN: [id number]  CC: blurred vision  Subjective: Theresa Barry is a 42 y.o. female who presents for follow-up visit Her concerns today include:   C/o blurry vision intermittently x 2 mths.  Has problems seeing things close up.  She has never seen an eye doctor.  C/o pain intermittently in knees, hands, and lower back No swelling in hands/knees -pain can last 7 days when it comes on. 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  Ovaries left in place.  States she was told she needs to have ovaries checked Q 2 yrs because mom died from Ovarian CA  Patient Active Problem List   Diagnosis Date Noted  . Diabetes mellitus screening 12/18/2017  . Chronic radicular pain of lower back 05/19/2017  . 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 LIPOMA 08/16/2007     Current Outpatient Medications on File Prior to Visit  Medication Sig Dispense Refill  . Hyoscyamine Sulfate SL (LEVSIN/SL) 0.125 MG SUBL Place 1 tablet under the tongue every 8 (eight) hours as needed. 12 each 1  . Prenatal Vit-Fe Fumarate-FA (MULTIVITAMIN-PRENATAL) 27-0.8 MG TABS tablet Take 1 tablet by mouth daily.     . ranitidine (ZANTAC) 150 MG tablet Take 1 tablet (150 mg total) by mouth at bedtime. 10 tablet 0   No current facility-administered medications on file prior to visit.     No Known Allergies  Social History   Socioeconomic History  . Marital status: Married    Spouse name: Not on file  . Number of children: Not on file  . Years of education: Not on file  . Highest education level: Not on file  Social Needs  . Financial resource strain: Not on file  . Food insecurity - worry: Not on file  . Food insecurity - inability: Not on file  . Transportation  needs - medical: Not on file  . Transportation needs - non-medical: Not on file  Occupational History  . Not on file  Tobacco Use  . Smoking status: Never Smoker  . Smokeless tobacco: Never Used  Substance and Sexual Activity  . Alcohol use: No  . Drug use: No  . Sexual activity: Yes    Birth control/protection: Surgical  Other Topics Concern  . Not on file  Social History Narrative  . Not on file    Family History  Problem Relation Age of Onset  . Diabetes Mother   . Diabetes Brother   . Breast cancer Paternal Aunt   . Breast cancer Paternal Aunt     Past Surgical History:  Procedure Laterality Date  . ABDOMINAL HYSTERECTOMY    . BILATERAL SALPINGECTOMY Bilateral 12/18/2015   Procedure: BILATERAL SALPINGECTOMY;  Surgeon: Woodroe Mode, MD;  Location: [facility];  Service: Gynecology;  Laterality: Bilateral;  . fibroidectomy     vaginally  . HYSTEROSCOPY W/D&C N/A 05/16/2013   Procedure: DILATATION AND CURETTAGE /HYSTEROSCOPY  Removal of [name];  Surgeon: Osborne Oman, MD;  Location: [facility];  Service: Gynecology;  Laterality: N/A;  . OVARIAN CYST REMOVAL    . VAGINAL HYSTERECTOMY N/A 12/18/2015   Procedure: HYSTERECTOMY VAGINAL;  Surgeon: Woodroe Mode, MD;  Location: [facility];  Service: Gynecology;  Laterality: N/A;    ROS: Review  of Systems  PHYSICAL EXAM: BP 108/73   Pulse (!) 54   Temp 98 F (36.7 C) (Oral)   Resp 16   Wt 169 lb 12.8 oz (77 kg)   LMP  (LMP Unknown)   SpO2 100%   BMI 30.08 kg/m   Physical Exam  General appearance - alert, well appearing, and in no distress Mental status - alert, oriented to person, place, and time, normal mood, behavior, speech, dress, motor activity, and thought processes Eyes - pupils equal and reactive, extraocular eye movements intact Musculoskeletal - no joint tenderness, deformity or swelling no signs of active inflammation in the hands or wrists.. Knees: No point tenderness.  Good range of motion.  No  crepitus. Lumbar spine: No point tenderness.   A1C 5.8  Results for orders placed or performed in visit on 12/18/17  POCT glycosylated hemoglobin (Hb A1C)  Result Value Ref Range   Hemoglobin A1C 5.8     ASSESSMENT AND PLAN: 1. Blurred vision Encourage patient to have routine eye exam done.  She does not have insurance.  I went over some places in the area that may be more affordable like [employer].  2. Polyarthralgia Exam and history does not suggest inflammatory arthritis.  Stop ibuprofen.  Start Naprosyn.  Encourage regular exercise - naproxen (NAPROSYN) 500 MG tablet; Take 1 tablet (500 mg total) by mouth 2 (two) times daily as needed.  Dispense: 30 tablet; Refill: 1  3. Diabetes mellitus screening A1c in range for prediabetes.  Encourage regular exercise.  Printed information given on prediabetes. - POCT glycosylated hemoglobin (Hb A1C)   Patient was given the opportunity to ask questions.  Patient verbalized understanding of the plan and was able to repeat key elements of the plan.  [employer] interpreter used during this encounter.  Orders Placed This Encounter  Procedures  . POCT glycosylated hemoglobin (Hb A1C)     Requested Prescriptions   Signed Prescriptions Disp Refills  . naproxen (NAPROSYN) 500 MG tablet 30 tablet 1    Sig: Take 1 tablet (500 mg total) by mouth 2 (two) times daily as needed.    Return if symptoms worsen or fail to improve.  Karle Plumber, MD, FACP

## 2018-01-06 ENCOUNTER — Other Ambulatory Visit: Payer: Self-pay | Admitting: Obstetrics and Gynecology

## 2018-01-06 DIAGNOSIS — Z1231 Encounter for screening mammogram for malignant neoplasm of breast: Secondary | ICD-10-CM

## 2018-01-26 ENCOUNTER — Encounter (HOSPITAL_COMMUNITY): Payer: Self-pay

## 2018-01-26 ENCOUNTER — Ambulatory Visit
Admission: RE | Admit: 2018-01-26 | Discharge: 2018-01-26 | Disposition: A | Discharge status: Home / Self Care | Payer: No Typology Code available for payment source | Source: Ambulatory Visit | Attending: Obstetrics and Gynecology | Admitting: Obstetrics and Gynecology

## 2018-01-26 ENCOUNTER — Ambulatory Visit (HOSPITAL_COMMUNITY)
Admission: RE | Admit: 2018-01-26 | Discharge: 2018-01-26 | Disposition: A | Discharge status: Home / Self Care | Payer: Self-pay | Source: Ambulatory Visit | Attending: Obstetrics and Gynecology | Admitting: Obstetrics and Gynecology

## 2018-01-26 ENCOUNTER — Other Ambulatory Visit (HOSPITAL_COMMUNITY): Payer: Self-pay | Admitting: *Deleted

## 2018-01-26 VITALS — BP 110/80 | Wt 166.4 lb

## 2018-01-26 DIAGNOSIS — N644 Mastodynia: Secondary | ICD-10-CM

## 2018-01-26 DIAGNOSIS — Z1231 Encounter for screening mammogram for malignant neoplasm of breast: Secondary | ICD-10-CM

## 2018-01-26 DIAGNOSIS — Z1239 Encounter for other screening for malignant neoplasm of breast: Secondary | ICD-10-CM

## 2018-01-26 NOTE — Patient Instructions (Addendum)
Explained breast self awareness to Theresa Barry. Patient did not need a Pap smear today due to her history of a hysterectomy for benign reasons. Let her know that she no longer needs Pap smears due to her history of a hysterectomy for benign reasons. Referred patient to the [facility] for a diagnostic mammogram. Appointment scheduled for Tuesday, January 26, 2018 at 1505. Theresa Barry verbalized understanding.  Regginald Pask, Arvil Chaco, RN 3:34 PM

## 2018-01-26 NOTE — Progress Notes (Signed)
Complaints of a pimple on her right breast that has been painful for the past three weeks. Patient rates the pain at a 6 out of 10.  Pap Smear: Pap smear not completed today. Last Pap smear was in 2015 at [facility] and normal per patient. Per patient has no history of an abnormal Pap smear. Patient has history of a hysterectomy 12/18/2015 due to fibroids, AUB, and Anemia. Patient no longer needs Pap smears due to her history of a hysterectomy for benign reasons per BCCCP and ACOG guidelines. Last Pap smear result is not in EPIC. Previous Pap smear result 04/04/2011 is in EPIC.   Physical exam: Breasts Breasts symmetrical. No skin abnormalities left breast. Observed a healed area within the right breast at 10 o'clock 12 cm from the nipple. No nipple retraction bilateral breasts. No nipple discharge bilateral breasts. No lymphadenopathy. No lumps palpated bilateral breasts. Complaints of tenderness when palpated the right breast at 10 o'clock 12 cm from the nipple where the scar was observed. Referred patient to the [facility] for a diagnostic mammogram. Appointment scheduled for Tuesday, January 26, 2018 at 1505.        Pelvic/Bimanual No Pap smear completed today since patient has a history of a hysterectomy for benign reasons. Pap smear not indicated per BCCCP guidelines.   Smoking History: Patient has never smoked.  Patient Navigation: Patient education provided. Access to services provided for patient through [facility] program. Spanish interpreter provided.   Breast and Cervical Cancer Risk Assessment: Patient has a family history of a maternal aunt and paternal aunt having breast cancer. Patient has no known genetic mutations or history of radiation treatment to the chest before age 26. Patient has no history of cervical dysplasia, immunocompromised, or DES exposure in-utero.  Used Spanish interpreter [employer] from [place].

## 2018-01-27 ENCOUNTER — Encounter (HOSPITAL_COMMUNITY): Payer: Self-pay | Admitting: *Deleted

## 2018-04-15 ENCOUNTER — Ambulatory Visit (HOSPITAL_COMMUNITY)
Admission: EM | Admit: 2018-04-15 | Discharge: 2018-04-15 | Disposition: A | Discharge status: Home / Self Care | Payer: No Typology Code available for payment source | Attending: Family Medicine | Admitting: Family Medicine

## 2018-04-15 ENCOUNTER — Encounter (HOSPITAL_COMMUNITY): Payer: Self-pay | Admitting: Emergency Medicine

## 2018-04-15 ENCOUNTER — Other Ambulatory Visit: Payer: Self-pay

## 2018-04-15 DIAGNOSIS — L02411 Cutaneous abscess of right axilla: Secondary | ICD-10-CM

## 2018-04-15 MED ORDER — IBUPROFEN 400 MG PO TABS: 400.0000 mg | ORAL_TABLET | Freq: Four times a day (QID) | ORAL | 0 refills | Status: DC | PRN

## 2018-04-15 MED ORDER — DOXYCYCLINE HYCLATE 100 MG PO CAPS: 100.0000 mg | ORAL_CAPSULE | Freq: Two times a day (BID) | ORAL | 0 refills | Status: AC

## 2018-04-15 NOTE — ED Notes (Signed)
Pt discharged by provider.

## 2018-04-15 NOTE — Discharge Instructions (Signed)
Warm compresses, ibuprofen for pain control. Complete course of antibiotics.   If symptoms worsen or do not improve in the next week to return to be seen or to follow up with your primary care provider.

## 2018-04-15 NOTE — ED Triage Notes (Signed)
Right axilla with abscess.  This sight noticed 3-4 months ago, a week ago, site worsened

## 2018-04-15 NOTE — ED Provider Notes (Signed)
Theresa Barry    CSN: [id number] Arrival date & time: 04/15/18  1709     History   Chief Complaint Chief Complaint  Patient presents with  . Abscess    HPI Theresa Barry is a 42 y.o. female.   [name] presents with tender area to her right axilla. States had similar in April, she took antibiotics and it had improved. Just returned this past week. Burning sensation. No drainage. Less severe and smaller than in April. No fevers. Pain 6/10. Prior to April has not had any similar. Hx of dm, fibroids.   Spanish video interpreter used to collect history and physical exam.    ROS per HPI.      Past Medical History:  Diagnosis Date  . Anemia   . Diabetes mellitus without complication (HCC)    elevated Hgb A1C  . Fibroid     Patient Active Problem List   Diagnosis Date Noted  . Diabetes mellitus screening 12/18/2017  . Chronic radicular pain of lower back 05/19/2017  . 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 LIPOMA 08/16/2007    Past Surgical History:  Procedure Laterality Date  . ABDOMINAL HYSTERECTOMY    . BILATERAL SALPINGECTOMY Bilateral 12/18/2015   Procedure: BILATERAL SALPINGECTOMY;  Surgeon: Woodroe Mode, MD;  Location: [facility];  Service: Gynecology;  Laterality: Bilateral;  . fibroidectomy     vaginally  . HYSTEROSCOPY W/D&C N/A 05/16/2013   Procedure: DILATATION AND CURETTAGE /HYSTEROSCOPY  Removal of [name];  Surgeon: Osborne Oman, MD;  Location: [facility];  Service: Gynecology;  Laterality: N/A;  . OVARIAN CYST REMOVAL    . VAGINAL HYSTERECTOMY N/A 12/18/2015   Procedure: HYSTERECTOMY VAGINAL;  Surgeon: Woodroe Mode, MD;  Location: [facility];  Service: Gynecology;  Laterality: N/A;    OB History    Gravida  5   Para  3   Term  3   Preterm      AB  1   Living  3     SAB  1   TAB      Ectopic      Multiple  1   Live Births  3            Home Medications    Prior to Admission medications     Medication Sig Start Date End Date Taking? Authorizing Provider  doxycycline (VIBRAMYCIN) 100 MG capsule Take 1 capsule (100 mg total) by mouth 2 (two) times daily for 10 days. 04/15/18 04/25/18  Zigmund Gottron, NP  ibuprofen (ADVIL,MOTRIN) 400 MG tablet Take 1 tablet (400 mg total) by mouth every 6 (six) hours as needed. 04/15/18   Zigmund Gottron, NP    Family History Family History  Problem Relation Age of Onset  . Diabetes Mother   . Cancer Mother   . Diabetes Brother   . Cancer Brother   . Breast cancer Paternal Aunt   . Breast cancer Maternal Aunt     Social History Social History   Tobacco Use  . Smoking status: Never Smoker  . Smokeless tobacco: Never Used  Substance Use Topics  . Alcohol use: No  . Drug use: No     Allergies   Patient has no known allergies.   Review of Systems Review of Systems   Physical Exam Triage Vital Signs ED Triage Vitals  Enc Vitals Group     BP 04/15/18 1741 (!) 187/73     Pulse Rate 04/15/18 1741 69  Resp 04/15/18 1741 18     Temp 04/15/18 1741 98 F (36.7 C)     Temp Source 04/15/18 1741 Oral     SpO2 04/15/18 1741 100 %     Weight --      Height --      Head Circumference --      Peak Flow --      Pain Score 04/15/18 1740 6     Pain Loc --      Pain Edu? --      Excl. in [place]? --    No data found.  Updated Vital Signs BP (!) 187/73 (BP Location: Right Arm)   Pulse 69   Temp 98 F (36.7 C) (Oral)   Resp 18   LMP  (LMP Unknown)   SpO2 100%   Visual Acuity Right Eye Distance:   Left Eye Distance:   Bilateral Distance:    Right Eye Near:   Left Eye Near:    Bilateral Near:     Physical Exam  Constitutional: She is oriented to person, place, and time. She appears well-developed and well-nourished. No distress.  Cardiovascular: Normal rate, regular rhythm and normal heart sounds.  Pulmonary/Chest: Effort normal and breath sounds normal.  Neurological: She is alert and oriented to person, place, and  time.  Skin: Skin is warm and dry.  Pea sized area to right axilla which is tender and firm. No fluctuance. No redness or surrounding induration      UC Treatments / Results  Labs (all labs ordered are listed, but only abnormal results are displayed) Labs Reviewed - No data to display  EKG None  Radiology No results found.  Procedures Procedures (including critical care time)  Medications Ordered in UC Medications - No data to display  Initial Impression / Assessment and Plan / UC Course  I have reviewed the triage vital signs and the nursing notes.  Pertinent labs & imaging results that were available during my care of the patient were reviewed by me and considered in my medical decision making (see chart for details).    Appears small area of developing abscess. Too small and without enough fluctuance to indicate incision and drainage at this time. Antibiotics and warm compresses recommended. Return precautions provided. Patient verbalized understanding and agreeable to plan.    Final Clinical Impressions(s) / UC Diagnoses   Final diagnoses:  Abscess of axilla, right     Discharge Instructions     Warm compresses, ibuprofen for pain control. Complete course of antibiotics.   If symptoms worsen or do not improve in the next week to return to be seen or to follow up with your primary care provider.    ED Prescriptions    Medication Sig Dispense Auth. Provider   doxycycline (VIBRAMYCIN) 100 MG capsule Take 1 capsule (100 mg total) by mouth 2 (two) times daily for 10 days. 20 capsule Augusto Gamble B, NP   ibuprofen (ADVIL,MOTRIN) 400 MG tablet Take 1 tablet (400 mg total) by mouth every 6 (six) hours as needed. 30 tablet Zigmund Gottron, NP     Controlled Substance Prescriptions [place] Controlled Substance Registry consulted? Not Applicable   Zigmund Gottron, NP 04/15/18 [phone]

## 2018-06-24 ENCOUNTER — Ambulatory Visit: Payer: Self-pay | Attending: Family Medicine | Admitting: Physician Assistant

## 2018-06-24 VITALS — BP 102/66 | HR 80 | Temp 98.4°F | Resp 18 | Ht 62.0 in | Wt 169.0 lb

## 2018-06-24 DIAGNOSIS — R102 Pelvic and perineal pain: Secondary | ICD-10-CM | POA: Insufficient documentation

## 2018-06-24 DIAGNOSIS — R110 Nausea: Secondary | ICD-10-CM

## 2018-06-24 DIAGNOSIS — Z90710 Acquired absence of both cervix and uterus: Secondary | ICD-10-CM | POA: Insufficient documentation

## 2018-06-24 DIAGNOSIS — E119 Type 2 diabetes mellitus without complications: Secondary | ICD-10-CM | POA: Insufficient documentation

## 2018-06-24 DIAGNOSIS — Z789 Other specified health status: Secondary | ICD-10-CM

## 2018-06-24 DIAGNOSIS — Z8543 Personal history of malignant neoplasm of ovary: Secondary | ICD-10-CM | POA: Insufficient documentation

## 2018-06-24 DIAGNOSIS — R1013 Epigastric pain: Secondary | ICD-10-CM | POA: Insufficient documentation

## 2018-06-24 LAB — POCT URINALYSIS DIP (CLINITEK)
BILIRUBIN UA: NEGATIVE
GLUCOSE UA: NEGATIVE mg/dL
Ketones, POC UA: NEGATIVE mg/dL
Leukocyte count, blood: NEGATIVE
Nitrite, UA: NEGATIVE
POC,PROTEIN,UA: NEGATIVE
RBC UA: NEGATIVE
SPEC GRAV UA: 1.015 (ref 1.010–1.025)
Urobilinogen, UA: 0.2 E.U./dL
pH, UA: 6.5 (ref 5.0–8.0)

## 2018-06-24 MED ORDER — ONDANSETRON HCL 8 MG PO TABS: 8.0000 mg | ORAL_TABLET | Freq: Three times a day (TID) | ORAL | 0 refills | Status: DC | PRN

## 2018-06-24 MED FILL — ONDANSETRON HCL 8 MG TABLET: 8 | 6 days supply | Qty: 20 | Fill #0

## 2018-06-24 NOTE — Progress Notes (Signed)
Patient ID: Theresa Barry, female   DOB: 05-12-1976, 42 y.o.   MRN: [id number]       Theresa Barry, is a 42 y.o. female  IRJ:[id number]  [id number]  DOB - [birth year]  Subjective:  Chief Complaint and HPI: Theresa Barry is a 42 y.o. female here today for  8 day history-Pelvic pain.  No dysuria.  She is concerned she may have ovarian CA because her mom died of that at age 42.  Stomach/midepigastric pain. No fever/chills Partial hysterectomy 2017. +nausea.  Legrand Como with [employer] interpreters translating.  ROS:   Constitutional:  No f/c, No night sweats, No unexplained weight loss. EENT:  No vision changes, No blurry vision, No hearing changes. No mouth, throat, or ear problems.  Respiratory: No cough, No SOB Cardiac: No CP, no palpitations GI:  +midepigastric abd pain, +N, no V/D. GU: No Urinary s/sx Musculoskeletal: No joint pain Neuro: No headache, no dizziness, no motor weakness.  Skin: No rash Endocrine:  No polydipsia. No polyuria.  Psych: Denies SI/HI  No problems updated.  ALLERGIES: No Known Allergies  PAST MEDICAL HISTORY: Past Medical History:  Diagnosis Date  . Anemia   . Diabetes mellitus without complication (HCC)    elevated Hgb A1C  . Fibroid     MEDICATIONS AT HOME: Prior to Admission medications   Medication Sig Start Date End Date Taking? Authorizing Provider  ibuprofen (ADVIL,MOTRIN) 400 MG tablet Take 1 tablet (400 mg total) by mouth every 6 (six) hours as needed. 04/15/18  Yes Burky, Lanelle Bal B, NP  ondansetron (ZOFRAN) 8 MG tablet Take 1 tablet (8 mg total) by mouth every 8 (eight) hours as needed for nausea or vomiting. 06/24/18   Argentina Donovan, PA-C     Objective:  EXAM:   Vitals:   06/24/18 1347  BP: 102/66  Pulse: 80  Resp: 18  Temp: 98.4 F (36.9 C)  TempSrc: Oral  SpO2: 100%  Weight: 169 lb (76.7 kg)  Height: 5\' 2"  (1.575 m)    General appearance : A&OX3. NAD. Non-toxic-appearing HEENT:  Atraumatic and Normocephalic.  PERRLA. EOM intact.  TM clear B. Mouth-MMM, post pharynx WNL w/o erythema, No PND. Neck: supple, no JVD. No cervical lymphadenopathy. No thyromegaly Chest/Lungs:  Breathing-non-labored, Good air entry bilaterally, breath sounds normal without rales, rhonchi, or wheezing  CVS: S1 S2 regular, no murmurs, gallops, rubs  Abdomen: Bowel sounds present, Non tender and not distended with no gaurding, rigidity or rebound. Extremities: Bilateral Lower Ext shows no edema, both legs are warm to touch with = pulse throughout Neurology:  CN II-XII grossly intact, Non focal.   Psych:  TP linear. J/I WNL. Normal speech. Appropriate eye contact and affect.  Skin:  No Rash  Data Review Lab Results  Component Value Date   HGBA1C 5.8 12/18/2017     Assessment & Plan   1. Pelvic pain S/p partial hysterectomy.  +FH ovarian CA 1st degree relative - POCT URINALYSIS DIP (CLINITEK) - US Transvaginal Non-OB; Future  2. Midepigastric pain Non-acute abdomen currently - H. pylori breath test - Comprehensive metabolic panel - CBC with Differential/Platelet  3. Language barrier stratus interpreters used and additional time performing visit was required.  4. Nausea - ondansetron (ZOFRAN) 8 MG tablet; Take 1 tablet (8 mg total) by mouth every 8 (eight) hours as needed for nausea or vomiting.  Dispense: 20 tablet; Refill: 0   Patient have been counseled extensively about nutrition and exercise  Return for keep 07/16/2018 appt with Zelda.  The patient  was given clear instructions to go to ER or return to medical center if symptoms don't improve, worsen or new problems develop. The patient verbalized understanding. The patient was told to call to get lab results if they haven't heard anything in the next week.     Freeman Caldron, PA-C [facility] and [facility], [place]   06/24/2018, 2:00 PM

## 2018-06-25 LAB — COMPREHENSIVE METABOLIC PANEL
ALBUMIN: 4.3 g/dL (ref 3.5–5.5)
ALK PHOS: 83 IU/L (ref 39–117)
ALT: 22 IU/L (ref 0–32)
AST: 21 IU/L (ref 0–40)
Albumin/Globulin Ratio: 1.9 (ref 1.2–2.2)
BILIRUBIN TOTAL: 0.7 mg/dL (ref 0.0–1.2)
BUN/Creatinine Ratio: 15 (ref 9–23)
BUN: 12 mg/dL (ref 6–24)
CHLORIDE: 104 mmol/L (ref 96–106)
CO2: 20 mmol/L (ref 20–29)
CREATININE: 0.82 mg/dL (ref 0.57–1.00)
Calcium: 8.9 mg/dL (ref 8.7–10.2)
GFR calc Af Amer: 101 mL/min/{1.73_m2} (ref >59)
GFR calc non Af Amer: 87 mL/min/{1.73_m2} (ref >59)
GLUCOSE: 99 mg/dL (ref 65–99)
Globulin, Total: 2.3 g/dL (ref 1.5–4.5)
Potassium: 3.9 mmol/L (ref 3.5–5.2)
Sodium: 140 mmol/L (ref 134–144)
TOTAL PROTEIN: 6.6 g/dL (ref 6.0–8.5)

## 2018-06-25 LAB — CBC WITH DIFFERENTIAL/PLATELET
BASOS ABS: 0 10*3/uL (ref 0.0–0.2)
Basos: 0 %
EOS (ABSOLUTE): 0.1 10*3/uL (ref 0.0–0.4)
EOS: 2 %
HEMATOCRIT: 38.7 % (ref 34.0–46.6)
HEMOGLOBIN: 12.5 g/dL (ref 11.1–15.9)
IMMATURE GRANS (ABS): 0.1 10*3/uL (ref 0.0–0.1)
IMMATURE GRANULOCYTES: 1 %
Lymphocytes Absolute: 2.1 10*3/uL (ref 0.7–3.1)
Lymphs: 27 %
MCH: 27.8 pg (ref 26.6–33.0)
MCHC: 32.3 g/dL (ref 31.5–35.7)
MCV: 86 fL (ref 79–97)
MONOCYTES: 7 %
Monocytes Absolute: 0.5 10*3/uL (ref 0.1–0.9)
Neutrophils Absolute: 4.9 10*3/uL (ref 1.4–7.0)
Neutrophils: 63 %
Platelets: 286 10*3/uL (ref 150–450)
RBC: 4.5 x10E6/uL (ref 3.77–5.28)
RDW: 13 % (ref 12.3–15.4)
WBC: 7.7 10*3/uL (ref 3.4–10.8)

## 2018-06-27 ENCOUNTER — Other Ambulatory Visit: Payer: Self-pay | Admitting: Physician Assistant

## 2018-06-27 LAB — H. PYLORI BREATH TEST: H PYLORI BREATH TEST: POSITIVE — AB

## 2018-06-27 MED ORDER — OMEPRAZOLE 20 MG PO CPDR: 20.0000 mg | DELAYED_RELEASE_CAPSULE | Freq: Two times a day (BID) | ORAL | 1 refills | Status: DC

## 2018-06-27 MED ORDER — CLARITHROMYCIN 500 MG PO TABS: 500.0000 mg | ORAL_TABLET | Freq: Two times a day (BID) | ORAL | 0 refills | Status: DC

## 2018-06-27 MED ORDER — AMOXICILLIN 500 MG PO CAPS: 1000.0000 mg | ORAL_CAPSULE | Freq: Two times a day (BID) | ORAL | 0 refills | Status: DC

## 2018-06-28 ENCOUNTER — Ambulatory Visit (HOSPITAL_COMMUNITY)
Admission: RE | Admit: 2018-06-28 | Discharge: 2018-06-28 | Disposition: A | Discharge status: Home / Self Care | Payer: Self-pay | Source: Ambulatory Visit | Attending: Physician Assistant | Admitting: Physician Assistant

## 2018-06-28 DIAGNOSIS — Z8041 Family history of malignant neoplasm of ovary: Secondary | ICD-10-CM | POA: Insufficient documentation

## 2018-06-28 DIAGNOSIS — R102 Pelvic and perineal pain: Secondary | ICD-10-CM | POA: Insufficient documentation

## 2018-06-28 DIAGNOSIS — Z90710 Acquired absence of both cervix and uterus: Secondary | ICD-10-CM | POA: Insufficient documentation

## 2018-06-28 MED FILL — AMOXICILLIN 500 MG CAPSULE: 500 | 10 days supply | Qty: 40 | Fill #0

## 2018-06-28 MED FILL — CLARITHROMYCIN 500 MG TAB: 500 | 10 days supply | Qty: 20 | Fill #0

## 2018-06-28 MED FILL — OMEPRAZOLE 20 MG CAP: 20 | 30 days supply | Qty: 60 | Fill #0

## 2018-06-29 ENCOUNTER — Telehealth: Payer: Self-pay | Admitting: *Deleted

## 2018-06-29 NOTE — Telephone Encounter (Signed)
Medical Assistant used [employer] to contact patient.  Interpreter Name: Arlyss Queen Interpreter #: [phone] Patient is aware of being positive for the bacteria which causes ulcers. Patient is aware of 3 scripts being sent to [facility] pharmacy and needing to take them as directed. All other labs look great. Patient advised to follow up as planned. No further questions.

## 2018-06-29 NOTE — Telephone Encounter (Signed)
-----   Message from Argentina Donovan, [place] sent at 06/27/2018  6:56 PM EDT ----- Please call patient.  Her stomach ulcer test was positive.  I sent her 3 prescriptions to our pharmacy that she should take as directed on the bottle for the next several weeks.  Her other labs look great.  Follow-up as planned.  Thanks, Freeman Caldron, PA-C

## 2018-07-16 ENCOUNTER — Ambulatory Visit: Payer: Self-pay | Attending: Nurse Practitioner | Admitting: Nurse Practitioner

## 2018-07-16 ENCOUNTER — Encounter: Payer: Self-pay | Admitting: Nurse Practitioner

## 2018-07-16 VITALS — BP 113/79 | HR 79 | Temp 98.2°F | Ht 62.0 in | Wt 170.4 lb

## 2018-07-16 DIAGNOSIS — A048 Other specified bacterial intestinal infections: Secondary | ICD-10-CM

## 2018-07-16 DIAGNOSIS — Z79899 Other long term (current) drug therapy: Secondary | ICD-10-CM | POA: Insufficient documentation

## 2018-07-16 DIAGNOSIS — R198 Other specified symptoms and signs involving the digestive system and abdomen: Secondary | ICD-10-CM

## 2018-07-16 DIAGNOSIS — E669 Obesity, unspecified: Secondary | ICD-10-CM | POA: Insufficient documentation

## 2018-07-16 DIAGNOSIS — Z90710 Acquired absence of both cervix and uterus: Secondary | ICD-10-CM | POA: Insufficient documentation

## 2018-07-16 DIAGNOSIS — Z791 Long term (current) use of non-steroidal anti-inflammatories (NSAID): Secondary | ICD-10-CM | POA: Insufficient documentation

## 2018-07-16 DIAGNOSIS — Z803 Family history of malignant neoplasm of breast: Secondary | ICD-10-CM | POA: Insufficient documentation

## 2018-07-16 DIAGNOSIS — M25562 Pain in left knee: Secondary | ICD-10-CM | POA: Insufficient documentation

## 2018-07-16 DIAGNOSIS — R7303 Prediabetes: Secondary | ICD-10-CM

## 2018-07-16 DIAGNOSIS — Z9889 Other specified postprocedural states: Secondary | ICD-10-CM | POA: Insufficient documentation

## 2018-07-16 DIAGNOSIS — K5900 Constipation, unspecified: Secondary | ICD-10-CM | POA: Insufficient documentation

## 2018-07-16 DIAGNOSIS — Z833 Family history of diabetes mellitus: Secondary | ICD-10-CM | POA: Insufficient documentation

## 2018-07-16 DIAGNOSIS — B9681 Helicobacter pylori [H. pylori] as the cause of diseases classified elsewhere: Secondary | ICD-10-CM | POA: Insufficient documentation

## 2018-07-16 DIAGNOSIS — R109 Unspecified abdominal pain: Secondary | ICD-10-CM | POA: Insufficient documentation

## 2018-07-16 LAB — GLUCOSE, POCT (MANUAL RESULT ENTRY): POC Glucose: 103 mg/dl — AB (ref 70–99)

## 2018-07-16 LAB — POCT GLYCOSYLATED HEMOGLOBIN (HGB A1C): HEMOGLOBIN A1C: 5.8 % — AB (ref 4.0–5.6)

## 2018-07-16 NOTE — Progress Notes (Signed)
Assessment & Plan:  Theresa Barry was seen today for establish care.  Diagnoses and all orders for this visit:  Prediabetes -     Glucose (CBG) -     HgB A1c -     Lipid panel Controlled Continue medications as prescribed.  Continue blood sugar control as discussed in office today, low carbohydrate diet, and regular physical exercise as tolerated, 150 minutes per week (30 min each day, 5 days per week, or 50 min 3 days per week).    Borborygmi Patient instructed to eat a bland diet. Avoid acidic, spicy or fried foods, caffeine, coffee, sodas,  alcohol and chocolate.   H. pylori infection Treatment completed. Will retest in 4 weeks    Patient has been counseled on age-appropriate routine health concerns for screening and prevention. These are reviewed and up-to-date. Referrals have been placed accordingly. Immunizations are up-to-date or declined.    Subjective:   Chief Complaint  Patient presents with  . Establish Care    Pt. is here to establish care. Pt. stated she still having stomach pain and finished her antibiotic for her stomach infection.    HPI Theresa Barry 42 y.o. female presents to office today to establish care and for follow up to abdominal pain. There is an onsite interpreter accompanying her today.    Abdominal Symptoms She was treated for H pylori on 06-27-2018. She endorses completing her course of treatment that was prescribed for 10 days. Today she endorses increased borborygmi and constipation. She endorses treating her constipation by increasing her water intake, drinking green juice and walking more. This does seem to relieve her symptoms of constipation but not her symptoms of borborygmi. She denies abdominal pain but endorses a strange sensation in her abdominal that accompanies her bowel sounds. She also states her bowel sounds have been louder than normal over the past several months. She states these symptoms were present prior to her being  treated for h pylori.  I will retest for H pylori in a few weeks. It has not been at least 4 weeks since she completed treatment. I explained this to her in detail. She completed treatment long enough to retest today. She is concerned that she may have ovarian cancers. States her mother passed from ovarian cancer and her brother passed from cancer as well. I have assured her that her most recent imaging last month did not show any ovarian issues. She is s/p partial hysterectomy 2 years ago d/t prolapsing fibroid.    Arthralgias She endorses generalized arthralgias in her lower back, bilateral knees and hands.  The pain is intermittent and goes away on its own. Duration of pain is up to an hour. Aggravating factors: none.  She is obese and does not exercise.  Pain is described as sharp and aching. I have recommended that she lose weight and exercise more which can greatly reduce her joint pain. Specifically in her back and knees.    Prediabetes Diet controlled. Chronic and stable. She denies any hypo or hyperglycemic symptoms.  Lab Results  Component Value Date   HGBA1C 5.8 (A) 07/16/2018   Review of Systems  Constitutional: Negative for fever, malaise/fatigue and weight loss.  HENT: Negative.  Negative for nosebleeds.   Eyes: Negative.  Negative for blurred vision, double vision and photophobia.  Respiratory: Negative.  Negative for cough and shortness of breath.   Cardiovascular: Negative.  Negative for chest pain, palpitations and leg swelling.  Gastrointestinal: Negative for blood in stool, constipation, diarrhea, heartburn,  melena, nausea and vomiting.       SEE HPI  Musculoskeletal: Positive for joint pain. Negative for myalgias.  Neurological: Negative.  Negative for dizziness, focal weakness, seizures and headaches.  Psychiatric/Behavioral: Negative.  Negative for suicidal ideas.    Past Medical History:  Diagnosis Date  . Anemia   . Diabetes mellitus without complication (HCC)      elevated Hgb A1C  . Fibroid     Past Surgical History:  Procedure Laterality Date  . ABDOMINAL HYSTERECTOMY    . BILATERAL SALPINGECTOMY Bilateral 12/18/2015   Procedure: BILATERAL SALPINGECTOMY;  Surgeon: Woodroe Mode, MD;  Location: [facility];  Service: Gynecology;  Laterality: Bilateral;  . fibroidectomy     vaginally  . HYSTEROSCOPY W/D&C N/A 05/16/2013   Procedure: DILATATION AND CURETTAGE /HYSTEROSCOPY  Removal of [name];  Surgeon: Osborne Oman, MD;  Location: [facility];  Service: Gynecology;  Laterality: N/A;  . OVARIAN CYST REMOVAL    . VAGINAL HYSTERECTOMY N/A 12/18/2015   Procedure: HYSTERECTOMY VAGINAL;  Surgeon: Woodroe Mode, MD;  Location: [facility];  Service: Gynecology;  Laterality: N/A;    Family History  Problem Relation Age of Onset  . Diabetes Mother   . Cancer Mother   . Diabetes Brother   . Cancer Brother   . Breast cancer Paternal Aunt   . Breast cancer Maternal Aunt     Social History Reviewed with no changes to be made today.   Outpatient Medications Prior to Visit  Medication Sig Dispense Refill  . ibuprofen (ADVIL,MOTRIN) 400 MG tablet Take 1 tablet (400 mg total) by mouth every 6 (six) hours as needed. 30 tablet 0  . ondansetron (ZOFRAN) 8 MG tablet Take 1 tablet (8 mg total) by mouth every 8 (eight) hours as needed for nausea or vomiting. 20 tablet 0  . amoxicillin (AMOXIL) 500 MG capsule Take 2 capsules (1,000 mg total) by mouth 2 (two) times daily. 40 capsule 0  . clarithromycin (BIAXIN) 500 MG tablet Take 1 tablet (500 mg total) by mouth 2 (two) times daily. 20 tablet 0  . omeprazole (PRILOSEC) 20 MG capsule Take 1 capsule (20 mg total) by mouth 2 (two) times daily before a meal. X 2 weeks then once daily (Patient not taking: Reported on 07/16/2018) 60 capsule 1   No facility-administered medications prior to visit.     No Known Allergies     Objective:    BP 113/79 (BP Location: Right Arm, Patient Position: Sitting, Cuff Size:  Normal)   Pulse 79   Temp 98.2 F (36.8 C) (Oral)   Ht 5\' 2"  (1.575 m)   Wt 170 lb 6.4 oz (77.3 kg)   LMP  (LMP Unknown)   SpO2 96%   BMI 31.17 kg/m  Wt Readings from Last 3 Encounters:  07/16/18 170 lb 6.4 oz (77.3 kg)  06/24/18 169 lb (76.7 kg)  01/26/18 166 lb 6.4 oz (75.5 kg)    Physical Exam  Constitutional: She is oriented to person, place, and time. She appears well-developed and well-nourished. She is cooperative.  HENT:  Head: Normocephalic and atraumatic.  Eyes: EOM are normal.  Neck: Normal range of motion.  Cardiovascular: Normal rate, regular rhythm and normal heart sounds. Exam reveals no gallop and no friction rub.  No murmur heard. Pulmonary/Chest: Effort normal and breath sounds normal. No tachypnea. No respiratory distress. She has no decreased breath sounds. She has no wheezes. She has no rhonchi. She has no rales. She exhibits no tenderness.  Abdominal: Soft. Bowel sounds are normal. She exhibits no distension and no mass. There is no tenderness. There is no rebound and no guarding. No hernia.  Musculoskeletal: She exhibits no edema.       Right hand: She exhibits normal range of motion. Normal sensation noted. Normal strength noted.       Left hand: She exhibits normal range of motion. Normal sensation noted. Normal strength noted.  Neurological: She is alert and oriented to person, place, and time. Coordination normal.  Skin: Skin is warm and dry.  Psychiatric: She has a normal mood and affect. Her behavior is normal. Judgment and thought content normal.  Nursing note and vitals reviewed.      Patient has been counseled extensively about nutrition and exercise as well as the importance of adherence with medications and regular follow-up. The patient was given clear instructions to go to ER or return to medical center if symptoms don't improve, worsen or new problems develop. The patient verbalized understanding.   Follow-up: Return in about 4 weeks (around  08/13/2018) for hpylori eradication .   Gildardo Pounds, FNP-BC [facility] and [facility], [place]   07/18/2018, 9:40 PM

## 2018-07-16 NOTE — Patient Instructions (Signed)
[employer]

## 2018-07-17 LAB — LIPID PANEL
CHOL/HDL RATIO: 3.7 ratio (ref 0.0–4.4)
Cholesterol, Total: 184 mg/dL (ref 100–199)
HDL: 50 mg/dL (ref >39)
LDL CALC: 94 mg/dL (ref 0–99)
TRIGLYCERIDES: 198 mg/dL — AB (ref 0–149)
VLDL Cholesterol Cal: 40 mg/dL (ref 5–40)

## 2018-07-18 ENCOUNTER — Encounter: Payer: Self-pay | Admitting: Nurse Practitioner

## 2018-08-10 ENCOUNTER — Encounter: Payer: Self-pay | Admitting: Internal Medicine

## 2018-08-10 ENCOUNTER — Ambulatory Visit: Payer: Self-pay | Attending: Internal Medicine | Admitting: Internal Medicine

## 2018-08-10 VITALS — BP 111/71 | HR 61 | Temp 98.5°F | Resp 16 | Wt 170.4 lb

## 2018-08-10 DIAGNOSIS — M545 Low back pain: Secondary | ICD-10-CM | POA: Insufficient documentation

## 2018-08-10 DIAGNOSIS — R7303 Prediabetes: Secondary | ICD-10-CM | POA: Insufficient documentation

## 2018-08-10 DIAGNOSIS — Z23 Encounter for immunization: Secondary | ICD-10-CM | POA: Insufficient documentation

## 2018-08-10 DIAGNOSIS — M2550 Pain in unspecified joint: Secondary | ICD-10-CM | POA: Insufficient documentation

## 2018-08-10 DIAGNOSIS — Z809 Family history of malignant neoplasm, unspecified: Secondary | ICD-10-CM | POA: Insufficient documentation

## 2018-08-10 DIAGNOSIS — A048 Other specified bacterial intestinal infections: Secondary | ICD-10-CM | POA: Insufficient documentation

## 2018-08-10 DIAGNOSIS — Z833 Family history of diabetes mellitus: Secondary | ICD-10-CM | POA: Insufficient documentation

## 2018-08-10 DIAGNOSIS — G8929 Other chronic pain: Secondary | ICD-10-CM | POA: Insufficient documentation

## 2018-08-10 DIAGNOSIS — Z79899 Other long term (current) drug therapy: Secondary | ICD-10-CM | POA: Insufficient documentation

## 2018-08-10 DIAGNOSIS — B9681 Helicobacter pylori [H. pylori] as the cause of diseases classified elsewhere: Secondary | ICD-10-CM | POA: Insufficient documentation

## 2018-08-10 MED ORDER — TETANUS-DIPHTH-ACELL PERTUSSIS 5-2.5-18.5 LF-MCG/0.5 IM SUSP: 0.5000 mL | Freq: Once | INTRAMUSCULAR | 0 refills | Status: AC

## 2018-08-10 NOTE — Progress Notes (Signed)
Patient ID: Theresa Barry, female    DOB: [birth year]  MRN: [id number]  CC: Follow-up   Subjective: Theresa Barry is a 42 y.o. female who presents for chronic ds management. Her concerns today include:  Pt with hx of pre-DM, recent H.pylori, polyarthralgia   Pt presents today for repeat H.pylori breath test. She completed treatment and reports she is doing well.  She inquires about results of lipid profile that was done on last visit. BS and A1C on last visit in the range for pre-DM.   Patient Active Problem List   Diagnosis Date Noted  . Diabetes mellitus screening 12/18/2017  . Chronic radicular pain of lower back 05/19/2017  . 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 LIPOMA 08/16/2007     Current Outpatient Medications on File Prior to Visit  Medication Sig Dispense Refill  . ibuprofen (ADVIL,MOTRIN) 400 MG tablet Take 1 tablet (400 mg total) by mouth every 6 (six) hours as needed. 30 tablet 0  . ondansetron (ZOFRAN) 8 MG tablet Take 1 tablet (8 mg total) by mouth every 8 (eight) hours as needed for nausea or vomiting. 20 tablet 0   No current facility-administered medications on file prior to visit.     No Known Allergies  Social History   Socioeconomic History  . Marital status: Married    Spouse name: Not on file  . Number of children: Not on file  . Years of education: Not on file  . Highest education level: Not on file  Occupational History  . Not on file  Social Needs  . Financial resource strain: Not on file  . Food insecurity:    Worry: Not on file    Inability: Not on file  . Transportation needs:    Medical: Not on file    Non-medical: Not on file  Tobacco Use  . Smoking status: Never Smoker  . Smokeless tobacco: Never Used  Substance and Sexual Activity  . Alcohol use: No  . Drug use: No  . Sexual activity: Yes    Birth control/protection: Surgical  Lifestyle  . Physical activity:    Days per week: Not on file   Minutes per session: Not on file  . Stress: Not on file  Relationships  . Social connections:    Talks on phone: Not on file    Gets together: Not on file    Attends religious service: Not on file    Active member of club or organization: Not on file    Attends meetings of clubs or organizations: Not on file    Relationship status: Not on file  . Intimate partner violence:    Fear of current or ex partner: Not on file    Emotionally abused: Not on file    Physically abused: Not on file    Forced sexual activity: Not on file  Other Topics Concern  . Not on file  Social History Narrative  . Not on file    Family History  Problem Relation Age of Onset  . Diabetes Mother   . Cancer Mother   . Diabetes Brother   . Cancer Brother   . Breast cancer Paternal Aunt   . Breast cancer Maternal Aunt     Past Surgical History:  Procedure Laterality Date  . ABDOMINAL HYSTERECTOMY    . BILATERAL SALPINGECTOMY Bilateral 12/18/2015   Procedure: BILATERAL SALPINGECTOMY;  Surgeon: Woodroe Mode, MD;  Location: [facility];  Service: Gynecology;  Laterality: Bilateral;  . fibroidectomy  vaginally  . HYSTEROSCOPY W/D&C N/A 05/16/2013   Procedure: DILATATION AND CURETTAGE /HYSTEROSCOPY  Removal of [name];  Surgeon: Osborne Oman, MD;  Location: [facility];  Service: Gynecology;  Laterality: N/A;  . OVARIAN CYST REMOVAL    . VAGINAL HYSTERECTOMY N/A 12/18/2015   Procedure: HYSTERECTOMY VAGINAL;  Surgeon: Woodroe Mode, MD;  Location: [facility];  Service: Gynecology;  Laterality: N/A;    ROS: Review of Systems Neg except as stated above PHYSICAL EXAM: BP 111/71   Pulse 61   Temp 98.5 F (36.9 C) (Oral)   Resp 16   Wt 170 lb 6.4 oz (77.3 kg)   LMP  (LMP Unknown)   SpO2 100%   BMI 31.17 kg/m   Physical Exam  General appearance - alert, well appearing, and in no distress Mental status - normal mood, behavior, speech, dress, motor activity, and thought processes Neck - supple,  no significant adenopathy Chest - clear to auscultation, no wheezes, rales or rhonchi, symmetric air entry Heart - normal rate, regular rhythm, normal S1, S2, no murmurs, rubs, clicks or gallops  Results for orders placed or performed in visit on 07/16/18  Lipid panel  Result Value Ref Range   Cholesterol, Total 184 100 - 199 mg/dL   Triglycerides 198 (H) 0 - 149 mg/dL   HDL 50 >39 mg/dL   VLDL Cholesterol Cal 40 5 - 40 mg/dL   LDL Calculated 94 0 - 99 mg/dL   Chol/HDL Ratio 3.7 0.0 - 4.4 ratio  Glucose (CBG)  Result Value Ref Range   POC Glucose 103 (A) 70 - 99 mg/dl  HgB A1c  Result Value Ref Range   Hemoglobin A1C 5.8 (A) 4.0 - 5.6 %   HbA1c POC (<> result, manual entry)     HbA1c, POC (prediabetic range)     HbA1c, POC (controlled diabetic range)      ASSESSMENT AND PLAN:  1. H. pylori infection We will check to see if infection has resolved. - H. pylori breath test  2. Prediabetes Dietary counseling given. Encourage some form of aerobic exercise at least 3 to 4 days a week for 30 to 45 minutes.  3. Need for Tdap vaccination  Patient was given the opportunity to ask questions.  Patient verbalized understanding of the plan and was able to repeat key elements of the plan.  [employer] interpreter used during this encounter. #[id number].   No orders of the defined types were placed in this encounter.    Requested Prescriptions   Signed Prescriptions Disp Refills  . Tdap (BOOSTRIX) 5-2.5-18.5 LF-MCG/0.5 injection 0.5 mL 0    Sig: Inject 0.5 mLs into the muscle once for 1 dose.    No follow-ups on file.  Karle Plumber, MD, FACP

## 2018-08-10 NOTE — Patient Instructions (Addendum)
Plan de alimentacin para la prediabetes (Prediabetes Eating Plan) La prediabetes, tambin llamada intolerancia a la glucosa o alteracin de la glucosa en ayunas, es una afeccin que eleva los niveles de azcar en la sangre (glucemia) por encima de lo normal. Seguir una dieta saludable puede ayudar a mantener la prediabetes bajo control, y tambin reduce el riesgo de tener diabetes tipo2 y cardiopata, que es ms alto en las personas que tienen esta afeccin. Junto con la actividad fsica habitual, una dieta saludable:  Promueve la prdida de [place].  Ayuda a Environmental consultant de Dispensing optician.  Ayuda a mejorar la forma en que el organismo [place] la insulina. QU DEBO SABER ACERCA DE ESTE PLAN DE [place]?  Use el ndice glucmico (IG) para planificar las comidas. El ndice le informa con qu rapidez un alimento elevar su nivel de azcar en la sangre. Elija los alimentos con bajo IG. Estos tardan ms tiempo en subir el nivel de azcar en la sangre.  Preste mucha atencin a la cantidad de hidratos de carbono que hay en los alimentos que consume. Los hidratos de carbono [employer] niveles de Dispensing optician.  Lleve un registro de la cantidad de caloras que ingiere. Ingerir la cantidad correcta de caloras lo ayudar a Development worker, international aid peso saludable. Bajar alrededor del 7por ciento del peso inicial puede ayudar a Product/process development scientist la diabetes tipo2.  Tal vez deba seguir Web designer. Esta incluye una gran cantidad de verduras, carnes magras o pescado, cereales integrales, frutas, as como aceites y grasas saludables.  QU ALIMENTOS PUEDO COMER? Cereales Cereales integrales, como panes, galletas, cereales y pastas de salvado o integrales. Avena sin azcar. Trigo burgol. Cebada. Quinua. Arroz integral. Tortillas o tacos de harina de maz o de salvado. [place] Valeda Malm. Espinaca. Guisantes. Remolachas. Coliflor. Repollo. Brcoli. Zanahorias. Tomates. [place]. Augustin Coupe. Hierbas.  Pimientos. Cebollas. Pepinos. Repollitos de Bruselas. Frutas Frutos rojos. Bananas. Manzanas. Naranjas. Uvas. Papaya. Mango. [place]. Kiwi. Pomelo. Cerezas. Carnes y otras fuentes de protenas Mariscos. Carnes Brazos, entre ellas, pollo y [place] o cortes magros de carne de cerdo y de [place]. Tofu. Huevos. Los frutos secos. Frijoles. Lcteos Productos lcteos descremados o semidescremados, como yogur, queso cottage y [place]. [employer]. T. Caf. Gaseosas sin azcar o dietticas. Agua de [place]. Leche. Productos alternativos [place], como leche de soja o de [place]. Condimentos [place]. Salsa de pepinillos. [place] con bajo contenido de [place] y de Location manager. Salsa barbacoa con bajo contenido de grasa y de azcar. [place] sin grasa o con bajo contenido de College. Dulces y postres [place] sin azcar o con bajo contenido de [place]. Helados y otros dulces congelados sin azcar o con bajo contenido de [place]. Grasas y Medical laboratory scientific officer. Nueces. Aceite de oliva. Los artculos mencionados arriba pueden no ser [employer] de las bebidas o los alimentos recomendados. Comunquese con el nutricionista para conocer ms opciones. QU ALIMENTOS NO SE RECOMIENDAN? Cereales Productos a base de [place] y de [place], como panes, pastas, bocadillos y cereales. Bebidas Bebidas azucaradas, como t helado y gaseosas con Location manager. Dulces y postres Productos de [place], [place] tortas, [place], [place], Museum/gallery exhibitions officer y tarta de [place]. Los artculos mencionados arriba pueden no ser [employer] de las bebidas y los alimentos que se Higher education careers adviser. Comunquese con el nutricionista para obtener ms informacin. Esta informacin no tiene Marine scientist el consejo del mdico. Asegrese de hacerle al mdico cualquier pregunta que tenga. Document Released: 06/13/2015 Document Revised: 06/13/2015 Document Reviewed: 10/18/2014 Elsevier  Interactive Patient Education  2017 [place] Td  (contra la difteria y el ttanos): Lo que debe saber (Td Vaccine Margaretmary Eddy and Diphtheria]: What You Need to Know) 1. Por qu vacunarse? El ttanos y la difteria son enfermedades muy graves. Son Futures trader frecuentes en los [place] actualmente, pero las personas que se infectan suelen tener complicaciones graves. La vacuna Td se [place] para proteger a los adolescentes y a los adultos de ambas enfermedades. Tanto el ttanos como la difteria son infecciones causadas por bacterias. La difteria se transmite de persona a persona a travs de la tos o el estornudo. La bacteria que causa el ttanos entra al cuerpo a travs de cortes, raspones o heridas. El TTANOS (trismo) provoca entumecimiento y Writer dolorosa de los msculos, por lo general, en todo el cuerpo.  Puede causar el endurecimiento de los msculos de la cabeza y el cuello, de modo que impide abrir la boca, tragar y en algunos casos, Ambulance person. El ttanos es causa de muerte en aproximadamente 1de cada 10personas que contraen la infeccin, incluso despus de que reciben la mejor atencin mdica. La DIFTERIA puede hacer que se forme una membrana gruesa en la parte posterior de la garganta.  Puede causar problemas respiratorios, parlisis, insuficiencia cardaca e incluso la muerte. Antes de las vacunas, en los [place] se informaban 200000 casos de difteria y cientos de casos de ttanos cada ao. Desde que comenz la vacunacin, los informes de casos de ambas enfermedades se han reducido en un 99%. 2. Edward Jolly Td La vacuna Td protege a adolescentes y adultos contra el ttanos y la difteria. La vacuna Td habitualmente se aplica como dosis de refuerzo cada 10aos, pero tambin puede administrarse antes si la persona sufre una [place] o herida sucia y grave. A veces, en lugar de la vacuna Td, se recomienda una vacuna llamada Tdap, que protege contra la tosferina, adems de proteger contra el ttanos y la difteria. El mdico o la persona  que le aplique la vacuna puede darle ms informacin al [employer]. La Td puede administrarse de manera segura simultneamente con otras vacunas. 3. Algunas personas no deben recibir la vacuna  Una persona que alguna vez ha tenido una reaccin alrgica potencialmente mortal a una dosis anterior de cualquier vacuna contra el ttanos o la difteria, O que tenga una alergia grave a cualquier parte de esta vacuna, no debe recibir la vacuna Td. Informe a la persona que le aplica la vacuna si usted tiene cualquier alergia grave.  Consulte con su mdico si: ? tuvo hinchazn o dolor intenso despus de recibir cualquier vacuna contra la difteria o el ttanos, ? alguna vez ha sufrido el sndrome de Curator, ? no se siente Pharmacologist en que se ha programado la vacuna.  4. Riesgos de [place] reaccin a la vacuna Con cualquier medicamento, incluyendo las vacunas, existe la posibilidad de que aparezcan efectos secundarios. Suelen ser leves y desaparecen por s solos. Tambin son posibles las reacciones graves, pero en raras ocasiones. [place] personas a las que se les aplica la vacuna Td no tienen ningn problema. Problemas leves despus de la vacuna Td: (No interfirieron en otras actividades)  Dolor en el lugar donde se aplic la vacuna (alrededor de 8de cada 10personas)  Enrojecimiento o hinchazn en el lugar donde se aplic la vacuna (alrededor de 1de cada 4personas)  Fiebre leve (poco frecuente)  Dolor de Pensions consultant (alrededor de 1de cada 4personas)  Cansancio (alrededor de 1de cada 4personas) Problemas  moderados despus de la vacuna Td: (Interfirieron en otras actividades, pero no requirieron atencin mdica)  [place] superior a 102F (38,8C) (poco frecuente) Problemas graves despus de la vacuna Td: (Impidieron Optometrist las actividades habituales; requirieron atencin mdica)  Clinical cytogeneticist, dolor intenso, sangrado o enrojecimiento en el brazo en que se aplic la vacuna (poco  frecuente). Problemas que podran ocurrir despus de cualquier vacuna:  Las personas a veces se desmayan despus de un procedimiento mdico, incluida la vacunacin. Si permanece sentado o recostado durante 15 minutos puede ayudar a [employer] y las lesiones causadas por las cadas. Informe al mdico si se siente mareado, tiene cambios en la visin o zumbidos en los odos.  Algunas personas sienten un dolor intenso en el hombro y tienen dificultad para mover el brazo donde se coloc la vacuna. Esto sucede con muy poca frecuencia.  Cualquier medicamento puede causar una reaccin alrgica grave. Dichas reacciones son Orlene Erm poco frecuentes con una vacuna (se calcula que menos de 1en un milln de dosis) y se producen de unos minutos a unas horas despus de Writer. Al igual que con cualquier [employer], existe una probabilidad muy remota de que una vacuna cause una lesin grave o la [place]. Se controla permanentemente la seguridad de las vacunas. Para obtener ms informacin, visite: http://www.aguilar.org/. 5. Qu pasa si hay una reaccin grave? A qu signos debo estar atento?  Observe todo lo que le preocupe, como signos de una reaccin alrgica grave, fiebre muy alta o comportamiento fuera de lo normal. Los signos de una reaccin alrgica grave pueden incluir ronchas, hinchazn de la cara y la garganta, dificultad para respirar, latidos cardacos acelerados, mareos y debilidad. Generalmente, estos comenzaran entre unos pocos minutos y algunas horas despus de la vacunacin. Qu debo hacer?  Si usted piensa que se trata de una reaccin alrgica grave o de otra emergencia que no puede esperar, llame al 911 o dirjase al hospital ms cercano. Sino, llame a su mdico.  Despus, la reaccin debe informarse al Sistema de Informacin sobre Efectos Adversos de las [place] (Vaccine Adverse Event Reporting System, VAERS). Su mdico puede presentar este informe, o puede hacerlo usted  mismo a travs del sitio web de VAERS, en www.vaers.SamedayNews.es, o llamando al 260-305-4344. VAERS no brinda recomendaciones mdicas. 6. [place] Compensacin de Daos por [place] de Compensacin de Daos por Clinical biochemist (National Vaccine Injury Compensation Program, VICP) es un programa federal que fue creado para Patent examiner a las personas que puedan haber sufrido daos al recibir ciertas vacunas. Aquellas personas que consideren que han sufrido un dao como consecuencia de una vacuna y [place] saber ms acerca del programa y de cmo presentar Raechel Chute, pueden llamar al [phone] o visitar su sitio web en GoldCloset.com.ee. Hay un lmite de tiempo para presentar un reclamo de compensacin. 7. Cmo puedo obtener ms informacin?  Consulte a su mdico. Este puede darle el prospecto de la vacuna o recomendarle otras fuentes de informacin.  Comunquese con el servicio de salud de su localidad o su estado.  Comunquese con los Centros para Building surveyor y la Prevencin de Probation officer for Disease Control and Prevention , CDC). ? Llame al [phone] (1-800-CDC-INFO). ? Visite el sitio Biomedical engineer en http://hunter.com/. Declaracin de informacin sobre la vacuna contra la difteria y el ttanos (Td) de los CDC (01/15/16) Esta informacin no tiene Marine scientist el consejo del mdico. Asegrese de hacerle al mdico cualquier pregunta que tenga. Document Released: 01/08/2009 Document Revised: 10/13/2014  Document Reviewed: 01/15/2016 Elsevier Interactive Patient Education  2017 [employer].

## 2018-08-12 ENCOUNTER — Telehealth: Payer: Self-pay | Admitting: Internal Medicine

## 2018-08-12 LAB — H. PYLORI BREATH TEST: H pylori Breath Test: NEGATIVE

## 2018-08-12 NOTE — Telephone Encounter (Signed)
Pt was called and no answer, LVM informing her lab results given by her Nurse, asked to give [place] a call back if she has any questions or concerns  -Skype by [place] -MRN [id number]  Pt is Theresa Barry  Could you let her know that the bacteria in her stomach has cleared

## 2019-03-07 ENCOUNTER — Other Ambulatory Visit (HOSPITAL_COMMUNITY): Payer: Self-pay | Admitting: *Deleted

## 2019-03-07 DIAGNOSIS — N6320 Unspecified lump in the left breast, unspecified quadrant: Secondary | ICD-10-CM

## 2019-03-11 ENCOUNTER — Other Ambulatory Visit (HOSPITAL_COMMUNITY): Payer: Self-pay | Admitting: *Deleted

## 2019-03-11 DIAGNOSIS — N6320 Unspecified lump in the left breast, unspecified quadrant: Secondary | ICD-10-CM

## 2019-03-31 ENCOUNTER — Ambulatory Visit
Admission: RE | Admit: 2019-03-31 | Discharge: 2019-03-31 | Disposition: A | Discharge status: Home / Self Care | Payer: No Typology Code available for payment source | Source: Ambulatory Visit | Attending: Obstetrics and Gynecology | Admitting: Obstetrics and Gynecology

## 2019-03-31 ENCOUNTER — Other Ambulatory Visit: Payer: Self-pay

## 2019-03-31 ENCOUNTER — Other Ambulatory Visit (HOSPITAL_COMMUNITY): Payer: Self-pay | Admitting: Obstetrics and Gynecology

## 2019-03-31 ENCOUNTER — Encounter (HOSPITAL_COMMUNITY): Payer: Self-pay

## 2019-03-31 ENCOUNTER — Ambulatory Visit (HOSPITAL_COMMUNITY)
Admission: RE | Admit: 2019-03-31 | Discharge: 2019-03-31 | Disposition: A | Discharge status: Home / Self Care | Payer: Self-pay | Source: Ambulatory Visit | Attending: Obstetrics and Gynecology | Admitting: Obstetrics and Gynecology

## 2019-03-31 DIAGNOSIS — N644 Mastodynia: Secondary | ICD-10-CM | POA: Insufficient documentation

## 2019-03-31 DIAGNOSIS — R2232 Localized swelling, mass and lump, left upper limb: Secondary | ICD-10-CM

## 2019-03-31 DIAGNOSIS — N6320 Unspecified lump in the left breast, unspecified quadrant: Secondary | ICD-10-CM

## 2019-03-31 NOTE — Progress Notes (Signed)
Complaints of left outer breast lump near axilla x one month that has decreased in size. Patient states the lump was painful at first that she rated at a 4 out of 10. Patient stated the pain has resolved and is a 0 out of 10 today.  Pap Smear: Pap smear not completed today. Last Pap smear was in 2015 at the [facility] and normal per patient. Per patient has no history of an abnormal Pap smear. Patient has history of a hysterectomy 12/18/2015 due to fibroids, AUB, and Anemia. Patient no longer needs Pap smears due to her history of a hysterectomy for benign reasons per BCCCP and ACOG guidelines. Last Pap smear result is not in EPIC. Previous Pap smear result 04/04/2011 is in EPIC.  Physical exam: Breasts Breasts symmetrical. No skin abnormalities bilateral breasts. No nipple retraction bilateral breasts. No nipple discharge bilateral breasts. No lymphadenopathy. No lumps palpated bilateral breasts. Unable to palpate a lump in patients area of concern. Complaints of tenderness at left outer breast and axilla on exam. Referred patient to the [facility] for a diagnostic mammogram and possible left breast ultrasound. Appointment scheduled for Thursday, March 31, 2019 at 1250.        Pelvic/Bimanual No Pap smear completed today sincepatient has a history of a hysterectomy for benign reasons. Pap smear not indicated per BCCCP guidelines.  Smoking History: Patient has never smoked.  Patient Navigation: Patient education provided. Access to services provided for patient through [facility] program. Spanish interpreter provided.   Breast and Cervical Cancer Risk Assessment: Patient has a family history of a maternal aunt and paternal aunt having breast cancer. Patient has no known genetic mutations or history of radiation treatment to the chest before age 67. Patient has no history of cervical dysplasia, immunocompromised, or DES exposure in-utero.  Risk Assessment    Risk Scores      03/31/2019   Last edited by: Armond Hang, LPN   5-year risk: 0.7 %   Lifetime risk: 8.2 %         Used Spanish interpreter [name] from [place].

## 2019-03-31 NOTE — Patient Instructions (Addendum)
Explained breast self awareness with Daana Carmona-Castro. Patient did not need a Pap smear today due to her history of a hysterectomy for benign reasons. Let her know that she no longer needs Pap smears due to her history of a hysterectomy for benign reasons. Referred patient to the [facility] for a diagnostic mammogram and possible left breast ultrasound. Appointment scheduled for Thursday, March 31, 2019 at 1250. Teretha Carmona-Castro verbalized understanding.  [place], Arvil Chaco, RN 11:09 AM

## 2019-04-01 ENCOUNTER — Encounter (HOSPITAL_COMMUNITY): Payer: Self-pay | Admitting: *Deleted

## 2020-06-13 ENCOUNTER — Other Ambulatory Visit: Payer: Self-pay | Admitting: Obstetrics and Gynecology

## 2020-06-13 DIAGNOSIS — Z1231 Encounter for screening mammogram for malignant neoplasm of breast: Secondary | ICD-10-CM

## 2020-07-17 ENCOUNTER — Ambulatory Visit: Payer: Self-pay | Admitting: *Deleted

## 2020-07-17 ENCOUNTER — Other Ambulatory Visit: Payer: Self-pay

## 2020-07-17 ENCOUNTER — Ambulatory Visit
Admission: RE | Admit: 2020-07-17 | Discharge: 2020-07-17 | Disposition: A | Discharge status: Home / Self Care | Payer: No Typology Code available for payment source | Source: Ambulatory Visit | Attending: Obstetrics and Gynecology | Admitting: Obstetrics and Gynecology

## 2020-07-17 VITALS — BP 118/82 | Temp 97.3°F | Wt 180.0 lb

## 2020-07-17 DIAGNOSIS — Z1239 Encounter for other screening for malignant neoplasm of breast: Secondary | ICD-10-CM

## 2020-07-17 DIAGNOSIS — Z1231 Encounter for screening mammogram for malignant neoplasm of breast: Secondary | ICD-10-CM

## 2020-07-17 NOTE — Progress Notes (Signed)
[name] is a 44 y.o. female who presents to [facility] clinic today with no complaints.    Pap Smear: Pap smear not completed today. Last Pap smear was 04/04/2011 at [facility] and normal. Per patient has no history of an abnormal Pap smear. Patient has history of a hysterectomy 12/18/2015 due to fibroids, AUB, and anemia. Patient no longer needs Pap smears due to her history of a hysterectomy for benign reasons per BCCCP and ASCCP guidelines. Last Pap smear result is available in EPIC.    Physical exam: Breasts Breasts symmetrical. No skin abnormalities bilateral breasts. No nipple retraction bilateral breasts. No nipple discharge bilateral breasts. No lymphadenopathy. No lumps palpated bilateral breasts. No complaints of pain or tenderness on exam.       Pelvic/Bimanual Pap is not indicated today per BCCCP guidelines.   Smoking History: Patient has never smoked.   Patient Navigation: Patient education provided. Access to services provided for patient through [place] program. Spanish interpreter [name] from [facility] provided.    Breast and Cervical Cancer Risk Assessment: Patient hasafamily history of a maternal aunt and paternal aunt havingbreast cancer. Patient has noknown genetic mutations orhistory ofradiation treatment to the chest before age 75. Patient has no history of cervical dysplasia, immunocompromised, or DES exposure in-utero.  Risk Assessment    Risk Scores      07/17/2020 03/31/2019   Last edited by: Demetrius Revel, LPN Rolena Infante H, LPN   5-year risk: 0.7 % 0.7 %   Lifetime risk: 8.1 % 8.2 %          A: BCCCP exam without pap smear No complaints.  P: Referred patient to the [facility] for a screening mammogram. Appointment scheduled Tuesday, July 17, 2020 at 1140.  Loletta Parish, RN 07/17/2020 9:29 AM

## 2020-07-17 NOTE — Patient Instructions (Signed)
Explained breast self awareness with Theresa Barry. Patient did not need a Pap smear today due to patient has a history of a hysterectomy for benign reasons. Let patient know that she doesn't need any further Pap smears due to her history of a hysterectomy for benign reasons. Referred patient to the [facility] for a screening mammogram. Appointment scheduled Tuesday, July 17, 2020 at 1140. Patient aware of appointment and will be there. Let patient know the [facility] will follow up with her within the next couple weeks with results of her mammogram by letter or phone. Theresa Barry verbalized understanding.  Shellee Streng, Arvil Chaco, RN 9:29 AM

## 2020-07-30 ENCOUNTER — Other Ambulatory Visit: Payer: Self-pay

## 2020-07-30 ENCOUNTER — Inpatient Hospital Stay: Payer: Self-pay | Attending: Obstetrics and Gynecology | Admitting: *Deleted

## 2020-07-30 VITALS — BP 104/78 | Ht 62.0 in | Wt 180.0 lb

## 2020-07-30 DIAGNOSIS — Z0000 Encounter for general adult medical examination without abnormal findings: Secondary | ICD-10-CM

## 2020-07-30 NOTE — Progress Notes (Addendum)
Wisewoman initial [place], [employer]   Clinical Measurement: There were no vitals filed for this visit. Fasting Labs Drawn Today, will review with patient when they result.   Medical History:  Patient states that she has high cholesterol, does not have high blood pressure and she does not have diabetes.  Medications:  Patient states that she does not take medication to lower cholesterol, blood pressure or blood sugar.  Patient does not take an aspirin a day to help prevent a heart attack or stroke.   Blood pressure, self measurement: Patient states that she does not measure blood pressure from home. She checks her blood pressure N/A. She shares her readings with a health care provider: N/A.   Nutrition: Patient states that on average she eats 0 cups of fruit and 0 cups of vegetables per day. Patient states that she does not eat fish at least 2 times per week. Patient eats less than half servings of whole grains. Patient drinks less than 36 ounces of beverages with added sugar weekly: yes. Patient is currently watching sodium or salt intake: yes. In the past 7 days patient has had 0 drinks containing alcohol. On average patient drinks 0 drinks containing alcohol per day.      Physical activity:  Patient states that she gets 0 minutes of moderate and 0 minutes of vigorous physical activity each week.  Smoking status:  Patient states that she has has never smoked.   Quality of life:  Over the past 2 weeks patient states that she had little interest or pleasure in doing things: several days. She has been feeling down, depressed or hopeless:several days.    Risk reduction and counseling:   Health Coaching: Spoke with patient about the daily recommendation of fruits and vegetables. Showed patient what a serving of fruit and vegetables would look like. Spoke with patient about the importance of heart healthy fish in a heart healthy diet. Gave examples of salmon, tuna,  mackerel or sardines to try adding to diet. Also example what whole grains are and how they can reduce the risk of heart disease. Encouraged patient to try adding more of the following whole grains (whole wheat bread or pasta, brown rice, oatmeal or whole grain cereals. Encouraged patient to try and start adding physical exercise into daily routine with a goal of 20 minutes per day.   Navigation:  I will notify patient of lab results.  Patient is aware of 2 more health coaching sessions and a follow up. Gave patient Financial planner for counseling services here in [place].  Time: 25 minutes

## 2020-07-31 LAB — LIPID PANEL
Chol/HDL Ratio: 4.7 ratio — ABNORMAL HIGH (ref 0.0–4.4)
Cholesterol, Total: 196 mg/dL (ref 100–199)
HDL: 42 mg/dL (ref >39)
LDL Chol Calc (NIH): 116 mg/dL — ABNORMAL HIGH (ref 0–99)
Triglycerides: 218 mg/dL — ABNORMAL HIGH (ref 0–149)
VLDL Cholesterol Cal: 38 mg/dL (ref 5–40)

## 2020-07-31 LAB — HEMOGLOBIN A1C
Est. average glucose Bld gHb Est-mCnc: 143 mg/dL
Hgb A1c MFr Bld: 6.6 % — ABNORMAL HIGH (ref 4.8–5.6)

## 2020-07-31 LAB — GLUCOSE, RANDOM: Glucose: 103 mg/dL — ABNORMAL HIGH (ref 65–99)

## 2020-08-01 ENCOUNTER — Other Ambulatory Visit: Payer: Self-pay | Admitting: Obstetrics and Gynecology

## 2020-08-01 DIAGNOSIS — R928 Other abnormal and inconclusive findings on diagnostic imaging of breast: Secondary | ICD-10-CM

## 2020-08-06 ENCOUNTER — Telehealth: Payer: Self-pay

## 2020-08-06 NOTE — Telephone Encounter (Signed)
Health coaching 2   interpreter- [name], [employer]   Labs- 196 cholesterol, 116 LDL cholesterol, 218 triglycerides, 42 HDL cholesterol, 6.6 hemoglobin A1C, 103 mean plasma glucose. Patient understands and is aware of her lab results.   Goals-  1. Reduce the amount of fried and fatty foods consumed. Try to bake, broil, sautee or grill foods instead. 2. Reduce the amount of whole fat dairy products consumed. Look for low-fat/ reduced fat options. 3. Reduce the amount of red meats consumed. 4. Add in more whole grains in daily diet. (brown rice, oatmeal, whole wheat bread or pasta and whole grain cereals) 5. Reduce the amount of sweets and sugars consumed. 6. Reduce the amounts of carbs consumed. Patient states that she consumes a lot of bread, rice and tortillas. Discussed daily recommendations for the grain/carbohydrate food group. 7. Try and start exercising for 20 minutes every day.     Navigation:  Patient is aware of 1 more health coaching sessions and a follow up. Patient is scheduled for follow-up with [facility] on Friday, August 17, 2020 @ 9:00 am.  Time- 20 minutes

## 2020-08-07 ENCOUNTER — Ambulatory Visit
Admission: RE | Admit: 2020-08-07 | Discharge: 2020-08-07 | Disposition: A | Discharge status: Home / Self Care | Payer: No Typology Code available for payment source | Source: Ambulatory Visit | Attending: Obstetrics and Gynecology | Admitting: Obstetrics and Gynecology

## 2020-08-07 ENCOUNTER — Other Ambulatory Visit: Payer: Self-pay

## 2020-08-07 DIAGNOSIS — R928 Other abnormal and inconclusive findings on diagnostic imaging of breast: Secondary | ICD-10-CM

## 2020-08-17 ENCOUNTER — Other Ambulatory Visit: Payer: Self-pay

## 2020-08-17 ENCOUNTER — Other Ambulatory Visit: Payer: Self-pay | Admitting: Family

## 2020-08-17 ENCOUNTER — Encounter: Payer: Self-pay | Admitting: Family

## 2020-08-17 ENCOUNTER — Ambulatory Visit: Payer: Self-pay | Attending: Family | Admitting: Family

## 2020-08-17 VITALS — BP 136/80 | HR 73 | Resp 16 | Wt 177.4 lb

## 2020-08-17 DIAGNOSIS — E119 Type 2 diabetes mellitus without complications: Secondary | ICD-10-CM

## 2020-08-17 DIAGNOSIS — Z789 Other specified health status: Secondary | ICD-10-CM

## 2020-08-17 DIAGNOSIS — M545 Low back pain, unspecified: Secondary | ICD-10-CM

## 2020-08-17 LAB — GLUCOSE, POCT (MANUAL RESULT ENTRY): POC Glucose: 98 mg/dL (ref 70–99)

## 2020-08-17 MED ORDER — METFORMIN HCL 500 MG PO TABS: 500.0000 mg | ORAL_TABLET | Freq: Two times a day (BID) | ORAL | 0 refills | Status: DC

## 2020-08-17 MED ORDER — TRUE METRIX METER W/DEVICE KIT: PACK | 0 refills | Status: DC

## 2020-08-17 MED ORDER — TRUEPLUS LANCETS 28G MISC: 4 refills | Status: AC

## 2020-08-17 MED ORDER — TRUE METRIX BLOOD GLUCOSE TEST VI STRP: ORAL_STRIP | 12 refills | Status: DC

## 2020-08-17 MED ORDER — CYCLOBENZAPRINE HCL 10 MG PO TABS: 10.0000 mg | ORAL_TABLET | Freq: Every day | ORAL | 0 refills | Status: DC

## 2020-08-17 MED FILL — CYCLOBENZAPRINE 10 MG TAB: 10 | 30 days supply | Qty: 30 | Fill #0

## 2020-08-17 MED FILL — TRUE METRIX TEST STRIP: 25 days supply | Qty: 100 | Fill #0

## 2020-08-17 MED FILL — METFORMIN HCL 500 MG TABS: 500 | 90 days supply | Qty: 180 | Fill #0

## 2020-08-17 MED FILL — TRUEplus LANCETS 28G MISC: 25 days supply | Qty: 100 | Fill #0

## 2020-08-17 MED FILL — !TRUE METRIX BLOOD GLUCOSE: 1 days supply | Qty: 1 | Fill #0

## 2020-08-17 NOTE — Progress Notes (Signed)
Patient ID: Theresa Barry, female    DOB: [birth year]  MRN: [id number]  CC: Elevated A1C  Subjective: Theresa Barry is a 44 y.o. female with history of chronic radicular pain of lower back, prolapsing fibroid of cervix, lipoma, and left breast pain who presents for elevated A1C referred by [facility].  1. PRE-DIABETES FOLLOW-UP: Last A1C: 6.6% on 07/30/2020 Are you fasting today: _0  Yes _1  No  Home Monitoring?  _2  Yes    _3  No Diet Adherence: _4  Yes    _5  No Exercise: _6  Yes    _7  No  2. BACK PAIN Location: bilateral lower back  Onset:  1 month, comes and goes Description: pressure, 6/10 Radiation: waist  Symptoms Worse with: bending Better with: Ibuprofen, and helps  Trauma: denies  Popping/clicking sounds with movement/bending: yes sometimes  Muscle spasms: yes  Night pain: yes No relief with bedrest: sometimes  Reports she works in [employer] and has to stand on her feet a lot.  Patient Active Problem List   Diagnosis Date Noted  . Breast pain, left 03/31/2019  . Screening breast examination 12/18/2017  . Chronic radicular pain of lower back 05/19/2017  . 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 LIPOMA 08/16/2007     Current Outpatient Medications on File Prior to Visit  Medication Sig Dispense Refill  . ibuprofen (ADVIL,MOTRIN) 400 MG tablet Take 1 tablet (400 mg total) by mouth every 6 (six) hours as needed. 30 tablet 0  . Multiple Vitamin (MULTIVITAMIN) capsule Take 1 capsule by mouth daily.    . ondansetron (ZOFRAN) 8 MG tablet Take 1 tablet (8 mg total) by mouth every 8 (eight) hours as needed for nausea or vomiting. (Patient not taking: Reported on 03/31/2019) 20 tablet 0   No current facility-administered medications on file prior to visit.    No Known Allergies  Social History   Socioeconomic History  . Marital status: Married    Spouse name: Not on file  . Number of children: 3  . Years of education: Not on  file  . Highest education level: High school graduate  Occupational History  . Not on file  Tobacco Use  . Smoking status: Never Smoker  . Smokeless tobacco: Never Used  Vaping Use  . Vaping Use: Never used  Substance and Sexual Activity  . Alcohol use: No  . Drug use: No  . Sexual activity: Not Currently    Birth control/protection: Surgical  Other Topics Concern  . Not on file  Social History Narrative  . Not on file   Social Determinants of Health   Financial Resource Strain:   . Difficulty of Paying Living Expenses: Not on file  Food Insecurity:   . Worried About Charity fundraiser in the Last Year: Not on file  . Ran Out of Food in the Last Year: Not on file  Transportation Needs: No Transportation Needs  . Lack of Transportation (Medical): No  . Lack of Transportation (Non-Medical): No  Physical Activity:   . Days of Exercise per Week: Not on file  . Minutes of Exercise per Session: Not on file  Stress:   . Feeling of Stress : Not on file  Social Connections:   . Frequency of Communication with Friends and Family: Not on file  . Frequency of Social Gatherings with Friends and Family: Not on file  . Attends Religious Services: Not on file  . Active Member of Clubs or Organizations: Not on file  .  Attends Archivist Meetings: Not on file  . Marital Status: Not on file  Intimate Partner Violence:   . Fear of Current or Ex-Partner: Not on file  . Emotionally Abused: Not on file  . Physically Abused: Not on file  . Sexually Abused: Not on file    Family History  Problem Relation Age of Onset  . Diabetes Mother   . Cancer Mother   . Ovarian cancer Mother   . Diabetes Brother   . Cancer Brother   . Breast cancer Paternal Aunt   . Breast cancer Maternal Aunt     Past Surgical History:  Procedure Laterality Date  . ABDOMINAL HYSTERECTOMY    . BILATERAL SALPINGECTOMY Bilateral 12/18/2015   Procedure: BILATERAL SALPINGECTOMY;  Surgeon: Woodroe Mode, MD;  Location: [facility];  Service: Gynecology;  Laterality: Bilateral;  . fibroidectomy     vaginally  . HYSTEROSCOPY WITH D & C N/A 05/16/2013   Procedure: DILATATION AND CURETTAGE /HYSTEROSCOPY  Removal of [name];  Surgeon: Osborne Oman, MD;  Location: [facility];  Service: Gynecology;  Laterality: N/A;  . OVARIAN CYST REMOVAL    . VAGINAL HYSTERECTOMY N/A 12/18/2015   Procedure: HYSTERECTOMY VAGINAL;  Surgeon: Woodroe Mode, MD;  Location: [facility];  Service: Gynecology;  Laterality: N/A;    ROS: Review of Systems Negative except as stated above  PHYSICAL EXAM: BP 136/80   Pulse 73   Resp 16   Wt 177 lb 6.4 oz (80.5 kg)   LMP  (LMP Unknown)   SpO2 100%   BMI 32.45 kg/m    Wt Readings from Last 3 Encounters:  08/17/20 177 lb 6.4 oz (80.5 kg)  07/30/20 180 lb (81.6 kg)  07/17/20 180 lb (81.6 kg)    Physical Exam General appearance - alert, well appearing, and in no distress and oriented to person, place, and time Mental status - alert, oriented to person, place, and time, normal mood, behavior, speech, dress, motor activity, and thought processes Neck - supple, no significant adenopathy Lymphatics - no palpable lymphadenopathy, no hepatosplenomegaly Chest - clear to auscultation, no wheezes, rales or rhonchi, symmetric air entry, no tachypnea, retractions or cyanosis Heart - normal rate, regular rhythm, normal S1, S2, no murmurs, rubs, clicks or gallops Back exam - limited range of motion, pain with motion noted during exam, tenderness noted bilateral middle and lower back, positive straight-leg raise right leg, normal reflexes and strength bilateral lower extremities, sensory exam intact bilateral lower extremities Neurological - alert, oriented, normal speech, no focal findings or movement disorder noted  ASSESSMENT AND PLAN: 1. Type 2 diabetes mellitus without complication, without long-term current use of insulin ([place]): - Patient has progressed from  pre-diabetes to diabetes.  - CBG, fasting, normal on today. - Hemoglobin on 07/30/2020 6.6% and consistent with diabetes. Previous hemoglobin A1C 5.8% on 07/16/2018. - Begin Metformin as prescribed. - Follow-up with clinical pharmacist in 4 weeks for diabetes checkup. Write your home blood sugar results down each day and bring those results to your appointment along with your home glucose monitor. Medications may be adjusted at that time if needed. - To achieve an A1C goal of less than or equal to 7.0 percent, a fasting blood sugar of 80 to 130 mg/dL and a postprandial glucose (90 to 120 minutes after a meal) less than 180 mg/dL. In the event of sugars less than 60 mg/dl or greater than 400 mg/dl please notify the clinic ASAP. It is recommended that you  undergo annual eye exams and annual foot exams. - Discussed the importance of healthy eating habits, low-carbohydrate diet, low-sugar diet, regular aerobic exercise (at least 150 minutes a week as tolerated) and medication compliance to achieve or maintain control of diabetes. - CMP to check kidney function, liver function, and electrolyte balance. - Follow-up with primary physician in 3 months or sooner if needed. - metFORMIN (GLUCOPHAGE) 500 MG tablet; Take 1 tablet (500 mg total) by mouth 2 (two) times daily with a meal.  Dispense: 180 tablet; Refill: 0 - Glucose (CBG) - CMP14+EGFR - CBC - Blood Glucose Monitoring Suppl (TRUE METRIX METER) w/Device KIT; Use as directed  Dispense: 1 kit; Refill: 0 - glucose blood (TRUE METRIX BLOOD GLUCOSE TEST) test strip; Use as instructed  Dispense: 100 each; Refill: 12 - TRUEplus Lancets 28G MISC; Use as directed  Dispense: 100 each; Refill: 4 - Amb Referral to Clinical Pharmacist  2. Acute bilateral low back pain without sciatica: - Continue over-the-counter Ibuprofen as this seems to be working well.  - Adding Cyclobenzaprine to help with muscle spasms. May cause drowsiness. Counseled to not consume if  operating heavy machinery or driving. Counseled to not consume with alcohol. - Follow-up with primary physician as needed. - cyclobenzaprine (FLEXERIL) 10 MG tablet; Take 1 tablet (10 mg total) by mouth at bedtime.  Dispense: 30 tablet; Refill: 0  3. Language barrier: - [place] Interpreter participated during today's visit.  - Interpreter Name: Royetta Car, ID#: [id number]  Patient was given the opportunity to ask questions.  Patient verbalized understanding of the plan and was able to repeat key elements of the plan. Patient was given clear instructions to go to Emergency Department or return to medical center if symptoms don't improve, worsen, or new problems develop.The patient verbalized understanding.   Orders Placed This Encounter  Procedures  . CMP14+EGFR  . CBC  . Amb Referral to Clinical Pharmacist  . Glucose (CBG)     Requested Prescriptions   Signed Prescriptions Disp Refills  . metFORMIN (GLUCOPHAGE) 500 MG tablet 180 tablet 0    Sig: Take 1 tablet (500 mg total) by mouth 2 (two) times daily with a meal.  . cyclobenzaprine (FLEXERIL) 10 MG tablet 30 tablet 0    Sig: Take 1 tablet (10 mg total) by mouth at bedtime.  . Blood Glucose Monitoring Suppl (TRUE METRIX METER) w/Device KIT 1 kit 0    Sig: Use as directed  . glucose blood (TRUE METRIX BLOOD GLUCOSE TEST) test strip 100 each 12    Sig: Use as instructed  . TRUEplus Lancets 28G MISC 100 each 4    Sig: Use as directed    Return in about 3 months (around 11/17/2020) for Dr. Wynetta [facility] and 1 month with Lurena Joiner.  Camillia Herter, NP

## 2020-08-17 NOTE — Patient Instructions (Addendum)
Metformin for diabetes and follow-up in 1 month for diabetes check-up.  Flexeril for muscle spasms of back and follow-up as needed.   Lab today.    Metformina para la diabetes y 87 en 1 mes para control de diabetes.  Flexeril para espasmos musculares de la espalda y seguimiento segn sea necesario.  Laboratorio hoy. Informacin bsica sobre la diabetes Diabetes Basics  La diabetes (diabetes mellitus) es una enfermedad de larga duracin (crnica). Se produce cuando el cuerpo no utiliza Occupational hygienist (glucosa) que se libera de los alimentos despus de comer. La diabetes puede deberse a uno de [employer] o a ambos:  El pncreas no produce suficiente cantidad de una hormona llamada insulina.  El cuerpo no reacciona de forma normal a la insulina que produce. La insulina permite que ciertos azcares (glucosa) ingresen a las clulas del cuerpo. Esto le proporciona energa. Si tiene diabetes, los azcares no pueden ingresar a las clulas. Esto produce un aumento del nivel de Dispensing optician (hiperglucemia). Sigue estas instrucciones en tu casa: Cmo se trata la diabetes? Es posible que tenga que administrarse insulina u otros medicamentos para la diabetes todos los [place] para mantener el nivel de Location manager en la sangre equilibrado. Adminstrese los medicamentos para la diabetes todos los [place] se lo haya indicado el mdico. Haga una lista de los medicamentos para la diabetes aqu: Medicamentos para la diabetes  Nombre del medicamento: ______________________________ ? Cantidad (dosis): ________________ Mellody Drown (a.m./p.m.): _______________ Wilford Grist: ___________________________________  Micki Riley medicamento: ______________________________ ? Cantidad (dosis): ________________ Mellody Drown (a.m./p.m.): _______________ Wilford Grist: ___________________________________  Micki Riley medicamento: ______________________________ ? Cantidad (dosis): ________________ Mellody Drown (a.m./p.m.):  _______________ Wilford Grist: ___________________________________ Si [place] insulina, aprender cmo aplicrsela con inyecciones. Es posible que deba ajustar la cantidad en funcin de los alimentos que coma. Haga una lista de los tipos de [place] aqu: Insulina  Tipo de insulina: ______________________________ ? Cantidad (dosis): ________________ Mellody Drown (a.m./p.m.): _______________ Wilford Grist: ___________________________________  Suezanne Jacquet: ______________________________ ? Cantidad (dosis): ________________ Mellody Drown (a.m./p.m.): _______________ Wilford Grist: ___________________________________  Suezanne Jacquet: ______________________________ ? Cantidad (dosis): ________________ Mellody Drown (a.m./p.m.): _______________ Wilford Grist: ___________________________________  Suezanne Jacquet: ______________________________ ? Cantidad (dosis): ________________ Mellody Drown (a.m./p.m.): _______________ Wilford Grist: ___________________________________  Suezanne Jacquet: ______________________________ ? Cantidad (dosis): ________________ Mellody Drown (a.m./p.m.): _______________ Wilford Grist: ___________________________________ Cmo me controlo el nivel de azcar en la sangre?  Controle sus niveles de azcar en la sangre con un medidor de glucemia segn las indicaciones del mdico. El mdico fijar los objetivos del tratamiento para usted. Generalmente, los resultados de los niveles de azcar en la sangre deben ser los siguientes:  Antes de las comidas (preprandial): de 80 a 130mg /dl (de 4,4 a 7,23mmol/l).  Despus de las comidas (posprandial): por debajo de 180mg /dl (78mmol/l).  Nivel de A1c: menos del 7%. Anote las veces que se controlar los niveles de azcar en la sangre: Controles de azcar en la sangre  Hora: _______________ Wilford Grist: ___________________________________  Mellody Drown: _______________ Notas: ___________________________________  Hora: _______________ Notas: ___________________________________  Hora: _______________  Notas: ___________________________________  Hora: _______________ Notas: ___________________________________  Hora: _______________ Notas: ___________________________________  Sander Nephew debo saber acerca del nivel bajo de azcar en la sangre? Un nivel bajo de azcar en la sangre se denomina hipoglucemia. Este cuadro ocurre cuando el nivel de azcar en la sangre es igual o menor que 70mg /dl (3,66mmol/l). Entre los sntomas, se pueden incluir los siguientes:  Sentir: ? Idalia. ? Preocupacin o nervios (ansiedad). ? Sudoracin y [employer]. ? Confusin. ? Mareos. ? Somnolencia. ? Ganas de vomitar (nuseas).  Tener: ? Latidos cardacos acelerados. ? Dolor de [place]. ? Cambios en la visin. ? Hormigueo y falta de sensibilidad (entumecimiento) alrededor de la boca, los labios o la [place]. ? Movimientos espasmdicos que no puede controlar (convulsiones).  Dificultades para hacer lo siguiente: ? Moverse (coordinacin). ? Dormir. ? Desmayos. ? Molestarse con facilidad (irritabilidad). Tratamiento del nivel bajo de azcar en la sangre Para tratar un nivel bajo de azcar en la sangre, ingiera un alimento o una bebida azucarada de inmediato. Si puede pensar con claridad y tragar de manera segura, siga la regla 15/15, que consiste en lo siguiente:  Consuma 15gramos de un hidrato de carbono de accin rpida (carbohidrato). Hable con su mdico acerca de cunto debera consumir.  Algunos hidratos de carbono de accin rpida son: ? Comprimidos de azcar (pastillas de glucosa). Consuma 3o 4pastillas de glucosa. ? De 6 a 8unidades de caramelos duros. ? De 4 a 6onzas (de 120 a 166ml) de jugo de frutas. ? De 4 a 6onzas (de 120 a 116ml) de refresco comn (no diettico). ? 1 cucharada (3ml) de miel o azcar.  Contrlese el nivel de azcar en la sangre 37minutos despus de ingerir el hidrato de carbono.  Si el nivel de azcar en la sangre todava es igual o menor que 70mg /dl  (3,20mmol/l), ingiera nuevamente 15gramos de un hidrato de carbono.  Si el nivel de azcar en la sangre no supera los 70mg /dl (3,1mmol/l) despus de 3intentos, solicite ayuda de inmediato.  Ingiera una comida o una colacin en el transcurso de 1hora despus de que el nivel de azcar en la sangre se haya normalizado. Tratamiento del nivel muy bajo de azcar en la sangre Si el nivel de azcar en la sangre es igual o menor que 54mg /dl (72mmol/l), significa que est muy bajo (hipoglucemia grave). Esto es Engineer, maintenance (IT). No espere a ver si los sntomas desaparecen. Solicite atencin mdica de inmediato. Comunquese con el servicio de emergencias de su localidad (911 en los [place]). No conduzca por sus propios medios [employer] hospital. Preguntas para hacerle al mdico  Es necesario que me rena con Radio broadcast assistant en el cuidado de la diabetes?  Qu equipos necesitar para cuidarme en casa?  Qu medicamentos para la diabetes necesito? Cundo debo tomarlos?  Con qu frecuencia debo controlar mi nivel de azcar en la sangre?  A qu nmero puedo llamar si tengo preguntas?  Cundo es mi prxima cita con el mdico?  Dnde puedo encontrar un grupo de apoyo para las personas con diabetes? Dnde buscar ms informacin  American Diabetes Association (Asociacin Estadounidense de la Diabetes): www.diabetes.org  American Association of Diabetes Educators (Asociacin Estadounidense de Instructores para el Cuidado de la Diabetes): www.diabeteseducator.org/patient-resources Comunquese con un mdico si:  El nivel de azcar en la sangre es igual o mayor que 240mg /dl (13,8mmol/dl) durante 2das seguidos.  Ha estado enfermo o ha tenido fiebre durante 2das o ms y no mejora.  Tiene alguno de estos problemas durante ms de 6horas: ? No puede comer ni beber. ? Siente malestar estomacal (nuseas). ? Vomita. ? Presenta heces lquidas (diarrea). Solicite ayuda inmediatamente  si:  El nivel de azcar en la sangre est por debajo de 54mg /dl (78mmol/l).  Se siente confundida.  Tiene dificultad para hacer lo siguiente: ? Pensar con claridad. ? La respiracin. Resumen  La diabetes (diabetes mellitus) es una enfermedad de larga duracin (crnica). Se produce cuando el cuerpo no utiliza Occupational hygienist (glucosa) que se libera de los alimentos despus de la digestin.  Aplquese la insulina y tome los medicamentos para la diabetes como se lo hayan indicado.  Contrlese el nivel de azcar en la sangre todos los [place], con la frecuencia que le hayan indicado.  Concurra a todas las visitas de seguimiento como se lo haya indicado el mdico. Esto es importante. Esta informacin no tiene Marine scientist el consejo del mdico. Asegrese de hacerle al mdico cualquier pregunta que tenga. Document Revised: 11/17/2018 Document Reviewed: 01/29/2018 Elsevier Patient Education  [place].

## 2020-08-18 LAB — CMP14+EGFR
ALT: 23 IU/L (ref 0–32)
AST: 21 IU/L (ref 0–40)
Albumin/Globulin Ratio: 1.9 (ref 1.2–2.2)
Albumin: 4.7 g/dL (ref 3.8–4.8)
Alkaline Phosphatase: 86 IU/L (ref 44–121)
BUN/Creatinine Ratio: 16 (ref 9–23)
BUN: 12 mg/dL (ref 6–24)
Bilirubin Total: 0.9 mg/dL (ref 0.0–1.2)
CO2: 20 mmol/L (ref 20–29)
Calcium: 9.2 mg/dL (ref 8.7–10.2)
Chloride: 105 mmol/L (ref 96–106)
Creatinine, Ser: 0.75 mg/dL (ref 0.57–1.00)
GFR calc Af Amer: 111 mL/min/{1.73_m2} (ref >59)
GFR calc non Af Amer: 96 mL/min/{1.73_m2} (ref >59)
Globulin, Total: 2.5 g/dL (ref 1.5–4.5)
Glucose: 98 mg/dL (ref 65–99)
Potassium: 4.1 mmol/L (ref 3.5–5.2)
Sodium: 139 mmol/L (ref 134–144)
Total Protein: 7.2 g/dL (ref 6.0–8.5)

## 2020-08-20 NOTE — Progress Notes (Signed)
Please call patient with update.   Kidney function normal.   Liver function normal.

## 2020-08-21 ENCOUNTER — Telehealth: Payer: Self-pay

## 2020-08-21 NOTE — Telephone Encounter (Signed)
[employer] interpreters naraya  Id# [id number]  contacted pt to go over lab results pt didn't answer lvm.

## 2020-09-17 ENCOUNTER — Ambulatory Visit: Payer: No Typology Code available for payment source | Admitting: Pharmacist

## 2020-10-18 ENCOUNTER — Ambulatory Visit: Payer: No Typology Code available for payment source | Admitting: Internal Medicine

## 2020-11-05 ENCOUNTER — Telehealth: Payer: Self-pay | Admitting: Internal Medicine

## 2020-11-05 NOTE — Telephone Encounter (Signed)
Called patient using interpreter services and voicemail box was full. Called to advise patient that her appoint for 11/27/20 had been cancelled due to provider being out of the office and needs to be rescheduled.

## 2020-11-27 ENCOUNTER — Ambulatory Visit: Payer: No Typology Code available for payment source | Admitting: Internal Medicine

## 2021-01-22 ENCOUNTER — Other Ambulatory Visit: Payer: Self-pay

## 2021-01-22 ENCOUNTER — Ambulatory Visit: Payer: Self-pay | Attending: Internal Medicine | Admitting: Internal Medicine

## 2021-01-22 ENCOUNTER — Encounter (INDEPENDENT_AMBULATORY_CARE_PROVIDER_SITE_OTHER): Payer: Self-pay

## 2021-01-22 ENCOUNTER — Encounter: Payer: Self-pay | Admitting: Internal Medicine

## 2021-01-22 VITALS — BP 134/81 | HR 71 | Resp 16 | Wt 175.8 lb

## 2021-01-22 DIAGNOSIS — E669 Obesity, unspecified: Secondary | ICD-10-CM

## 2021-01-22 DIAGNOSIS — R030 Elevated blood-pressure reading, without diagnosis of hypertension: Secondary | ICD-10-CM

## 2021-01-22 DIAGNOSIS — E1169 Type 2 diabetes mellitus with other specified complication: Secondary | ICD-10-CM

## 2021-01-22 DIAGNOSIS — G8929 Other chronic pain: Secondary | ICD-10-CM

## 2021-01-22 DIAGNOSIS — M545 Low back pain, unspecified: Secondary | ICD-10-CM

## 2021-01-22 LAB — GLUCOSE, POCT (MANUAL RESULT ENTRY): POC Glucose: 96 mg/dl (ref 70–99)

## 2021-01-22 LAB — POCT GLYCOSYLATED HEMOGLOBIN (HGB A1C): HbA1c, POC (controlled diabetic range): 6 % (ref 0.0–7.0)

## 2021-01-22 MED ORDER — METFORMIN HCL 500 MG PO TABS
ORAL_TABLET | Freq: Two times a day (BID) | ORAL | 0 refills | Status: DC
  Filled 2021-01-22: qty 60, 30d supply, fill #0
  Filled 2021-04-17: qty 60, 30d supply, fill #1
  Filled 2021-05-29: qty 60, 30d supply, fill #2

## 2021-01-22 MED ORDER — CYCLOBENZAPRINE HCL 5 MG PO TABS
5.0000 mg | ORAL_TABLET | Freq: Every day | ORAL | 1 refills | Status: DC | PRN
  Filled 2021-01-22: qty 30, 30d supply, fill #0

## 2021-01-22 MED ORDER — IBUPROFEN 800 MG PO TABS
800.0000 mg | ORAL_TABLET | Freq: Three times a day (TID) | ORAL | 1 refills | Status: DC | PRN
  Filled 2021-01-22: qty 60, 20d supply, fill #0

## 2021-01-22 NOTE — Patient Instructions (Signed)
Diabetes mellitus y nutricin, en adultos Diabetes Mellitus and Nutrition, Adult Si sufre de diabetes, o diabetes mellitus, es muy importante tener hbitos alimenticios saludables debido a que sus niveles de Designer, television/film set sangre (glucosa) se ven afectados en gran medida por lo que come y bebe. [place] alimentos saludables en las cantidades correctas, aproximadamente a la misma hora todos los [place], [place] ayudar a:  Aeronautical engineer glucemia.  Disminuir el riesgo de sufrir una enfermedad cardaca.  Mejorar la presin arterial.  Science writer o mantener un peso saludable. Qu puede afectar mi plan de alimentacin? Todas las personas que sufren de diabetes son diferentes y cada una tiene necesidades diferentes en cuanto a un plan de alimentacin. El mdico puede recomendarle que trabaje con un nutricionista para elaborar el mejor plan para usted. Su plan de alimentacin puede variar segn factores como:  Las caloras que necesita.  Los medicamentos que toma.  Su peso.  Sus niveles de glucemia, presin arterial y colesterol.  Su nivel de [place].  Otras afecciones que tenga, como enfermedades cardacas o renales. Cmo me afectan los carbohidratos? Los carbohidratos, o hidratos de carbono, afectan su nivel de glucemia ms que cualquier otro tipo de alimento. La ingesta de carbohidratos naturalmente aumenta la cantidad de [employer]. El recuento de carbohidratos es un mtodo destinado a Catering manager un registro de la cantidad de carbohidratos que se consumen. El recuento de carbohidratos es importante para Theatre manager la glucemia a un nivel saludable, especialmente si utiliza insulina o toma determinados medicamentos por va oral para la diabetes. Es importante conocer la cantidad de carbohidratos que se pueden ingerir en cada comida sin correr Engineer, manufacturing. Esto es Psychologist, forensic. Su nutricionista puede ayudarlo a calcular la cantidad de carbohidratos que debe ingerir en cada comida y en cada  refrigerio. Cmo me afecta el alcohol? El alcohol puede provocar disminuciones sbitas de la glucemia (hipoglucemia), especialmente si utiliza insulina o toma determinados medicamentos por va oral para la diabetes. La hipoglucemia es una afeccin potencialmente mortal. Los sntomas de la hipoglucemia, como somnolencia, mareos y confusin, son similares a los sntomas de haber consumido demasiado alcohol.  No beba alcohol si: ? Su mdico le indica no hacerlo. ? Est embarazada, puede estar embarazada o est tratando de quedar embarazada.  Si bebe alcohol: ? No beba con el estmago vaco. ? Limite la cantidad que bebe:  De 0 a 1 medida por da para las mujeres.  De 0 a 2 medidas por da para los hombres. ? Est atento a la cantidad de alcohol que hay en las bebidas que toma. En los [place], una medida equivale a una botella de cerveza de 12oz (368m), un vaso de vino de 5oz (1468m o un vaso de una bebida alcohlica de alta graduacin de 1oz (4485m ? Mantngase hidratado bebiendo agua, refrescos dietticos o t helado sin azcar.  Tenga en cuenta que los refrescos comunes, los jugos y otras bebida para mezOptician, dispensingeden contener mucha azcar y se deben contar como carbohidratos. Consejos para seguir estPhotographers etiquetas de los alimentos  Comience por leer el tamao de la porcin en la "Informacin nutricional" en las etiquetas de los alimentos envasados y las bebidas. La cantidad de caloras, carbohidratos, grasas y otros nutrientes mencionados en la etiqueta se basan en una porcin del alimento. Muchos alimentos contienen ms de una porcin por envase.  Verifique la cantidad total de gramos (g) de carbohidratos totales en una porcin. Puede calcular la cantidad de porciones  de carbohidratos al dividir el total de carbohidratos por 15. Por ejemplo, si un alimento tiene un total de 30g de carbohidratos totales por porcin, equivale a 2 porciones de  carbohidratos.  Verifique la cantidad de gramos (g) de grasas saturadas y grasas trans de una porcin. Escoja alimentos que no contengan estas grasas o que su contenido de estas sea [place].  Verifique la cantidad de miligramos (mg) de sal (sodio) en una porcin. La [employer] de las personas deben limitar la ingesta de sodio total a menos de 2333m por dTraining and development officer  Siempre consulte la informacin nutricional de los alimentos etiquetados como "con bajo contenido de grasa" o "sin grasa". Estos alimentos pueden tener un mayor contenido de aLocation manageragregada o carbohidratos refinados, y deben evitarse.  Hable con su nutricionista para identificar sus objetivos diarios en cuanto a los nutrientes mencionados en la etiqueta. Al ir de compras  Evite comprar alimentos procesados, enlatados o precocidos. Estos alimentos tienden a tSpecial educational needs teachermayor cantidad de gF[place] sodio y azcar agregada.  Compre en la zona exterior de la tienda de comestibles. Esta es la zona donde se encuentran con mayor frecuencia las frutas y las verduras frescas, los cereales a granel, las carnes frescas y los productos lcteos frescos. Al cocinar  Utilice mtodos de coccin a baja temperatura, como hornear, en lugar de mtodos de coccin a alta temperatura, como frer en abundante aceite.  Cocine con aceites saludables, como el aceite de oFrontier canola o gPiketon  Evite cocinar con manteca, crema o carnes con alto contenido de grasa. Planificacin de las comidas  Coma las comidas y los refrigerios regularmente, preferentemente a la misma hora todos lKnight[place] Evite pasar largos perodos de tiempo sin comer.  Consuma alimentos ricos en fibra, como frutas frescas, verduras, frijoles y cereales integrales. Consulte a su nutricionista sobre cuntas porciones de carbohidratos puede consumir en cada comida.  Consuma entre 4 y 6 onzas (entre 112 y 168g) de protenas magras por da, como carnes magras, pollo, pescado, huevos o tofu. Una onza (oz) de  protena magra equivale a: ? 1 onza (28g) de carne, pollo o pescado. ? 1huevo. ?  de taza (62 g) de tofu.  Coma algunos alimentos por da que contengan grasas saludables, como aguacates, frutos secos, semillas y pescado.   Qu alimentos debo comer? FLambert ModyBayas. Manzanas. Naranjas. Duraznos. Damascos. Ciruelas. Uvas. Mango. Papaya. GTolna Kiwi. Cerezas. VHolland CommonsLValeda Malm Espinaca. Verduras de hBoeing que incluyen col rizada, aFolsom hojas de bIraqy de mFountain Remolachas. Coliflor. Repollo. Brcoli. Zanahorias. Judas verdes. Tomates. Pimientos. Cebollas. Pepinos. Coles de Bruselas. Granos Granos integrales, como panes, galletas, tortillas, cereales y pastas de salvado o integrales. Avena sin azcar. Quinua. Arroz integral o salvaje. Carnes y oPsychiatric nurse Carne de ave sin piel. Cortes magros de ave y carne de res. Tofu. Frutos secos. Semillas. Lcteos Productos lcteos sin grasa o con bajo contenido de gPepin cUpper Stewartsville yogur y qKahaluu-Keauhou Es posible que los productos que se enumeran ms aNew Caledoniano constituyan una lista completa de los alimentos y las bebidas que puede tomar. Consulte a un nutricionista para obtener ms informacin. Qu alimentos debo evitar? FLambert ModyFrutas enlatadas al almbar. Verduras Verduras enlatadas. Verduras congeladas con mantequilla o salsa de crema. Granos Productos elaborados con hIsraely hLao People's Democratic Republic como panes, pastas, bocadillos y cereales. Evite todos los alimentos procesados. Carnes y otras protenas Cortes de carne con alto contenido de gLobbyist Carne de ave con piel. Carnes empanizadas o fritas. Carne procesada. Evite las grasas saturadas.  Lcteos Yogur, [place] enteros. Bebidas Bebidas azucaradas, como gaseosas o t helado. Es posible que los productos que se enumeran ms [place] no constituyan una lista completa de los alimentos y las bebidas que Nurse, adult. Consulte a un nutricionista para obtener ms  informacin. Preguntas para hacerle al mdico  Es necesario que me rena con Radio broadcast assistant en el cuidado de la diabetes?  Es necesario que me rena con un nutricionista?  A qu nmero puedo llamar si tengo preguntas?  Cules son los mejores momentos para controlar la glucemia? Dnde encontrar ms informacin:  Asociacin Estadounidense de la Diabetes (American Diabetes Association): diabetes.org  Academy of Nutrition and Dietetics (Academia de Nutricin y Information systems manager): www.eatright.[employer] of Diabetes and Digestive and Kidney Diseases ([place] la Diabetes y [place] y [place]): DesMoinesFuneral.dk  Association of Diabetes Care and Education Specialists (Asociacin de Especialistas en Atencin y Educacin sobre la Diabetes): www.diabeteseducator.org Resumen  Es importante tener hbitos alimenticios saludables debido a que sus niveles de Designer, television/film set sangre (glucosa) se ven afectados en gran medida por lo que come y bebe.  Un plan de alimentacin saludable lo ayudar a controlar la glucemia y Theatre manager un estilo de vida saludable.  El mdico puede recomendarle que trabaje con un nutricionista para elaborar el mejor plan para usted.  Tenga en cuenta que los carbohidratos (hidratos de carbono) y el alcohol tienen efectos inmediatos en sus niveles de glucemia. Es importante contar los carbohidratos que ingiere y consumir alcohol con prudencia. Esta informacin no tiene Marine scientist el consejo del mdico. Asegrese de hacerle al mdico cualquier pregunta que tenga. Document Revised: 10/27/2019 Document Reviewed: 10/27/2019 Elsevier Patient Education  2021 [employer].

## 2021-01-22 NOTE — Progress Notes (Signed)
Patient ID: Theresa Barry, female    DOB: Jun 19, 1976  MRN: [id number]  CC: Diabetes and Back Pain   Subjective: Theresa Barry is a 45 y.o. female who presents for chronic ds management Her concerns today include:  Pt with hx of DM,  H.pylori, polyarthralgia   C/o pain in lower back x 5 mths.  No initiating factors Intermittent but almost every day, can last for hrs. All across the lower back Some pain in RT leg at times but not related to back pain Some numbness in feet at times, intermittent Worse when she is up doing anything.  Stinging pain when walking.  Endorses worse with bending/lifting Better with laying down, Ibuprofen which she takes QOD.   takes 800 mg once on the days when she takes it. 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  Does a lot of standing.  Use to do a lot of lifting but now she ask for help.  -some pelvic pressure, no dysuria or hematuria  DM:  Taking Metformin BID.  Not checking BS Trying to eat healthier Walking 2-3 x a wk  Results for orders placed or performed in visit on 01/22/21  POCT glucose (manual entry)  Result Value Ref Range   POC Glucose 96 70 - 99 mg/dl  POCT glycosylated hemoglobin (Hb A1C)  Result Value Ref Range   Hemoglobin A1C     HbA1c POC (<> result, manual entry)     HbA1c, POC (prediabetic range)     HbA1c, POC (controlled diabetic range) 6.0 0.0 - 7.0 %    Patient Active Problem List   Diagnosis Date Noted  . Breast pain, left 03/31/2019  . Screening breast examination 12/18/2017  . Chronic radicular pain of lower back 05/19/2017  . 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 LIPOMA 08/16/2007     Current Outpatient Medications on File Prior to Visit  Medication Sig Dispense Refill  . Blood Glucose Monitoring Suppl (TRUE METRIX METER) w/Device KIT Use as directed 1 kit 0  . glucose blood (TRUE METRIX BLOOD GLUCOSE TEST) test strip Use as instructed 100  each 12  . Multiple Vitamin (MULTIVITAMIN) capsule Take 1 capsule by mouth daily.    . TRUEplus Lancets 28G MISC Use as directed 100 each 4   No current facility-administered medications on file prior to visit.    Not on File  Social History   Socioeconomic History  . Marital status: Married    Spouse name: Not on file  . Number of children: 3  . Years of education: Not on file  . Highest education level: High school graduate  Occupational History  . Not on file  Tobacco Use  . Smoking status: Never Smoker  . Smokeless tobacco: Never Used  Vaping Use  . Vaping Use: Never used  Substance and Sexual Activity  . Alcohol use: No  . Drug use: No  . Sexual activity: Not Currently    Birth control/protection: Surgical  Other Topics Concern  . Not on file  Social History Narrative  . Not on file   Social Determinants of Health   Financial Resource Strain: Not on file  Food Insecurity: Not on file  Transportation Needs: No Transportation Needs  . Lack of Transportation (Medical): No  . Lack of Transportation (Non-Medical): No  Physical Activity: Not on file  Stress: Not on file  Social Connections: Not on file  Intimate Partner Violence: Not on file  Family History  Problem Relation Age of Onset  . Diabetes Mother   . Cancer Mother   . Ovarian cancer Mother   . Diabetes Brother   . Cancer Brother   . Breast cancer Paternal Aunt   . Breast cancer Maternal Aunt     Past Surgical History:  Procedure Laterality Date  . ABDOMINAL HYSTERECTOMY    . BILATERAL SALPINGECTOMY Bilateral 12/18/2015   Procedure: BILATERAL SALPINGECTOMY;  Surgeon: Woodroe Mode, MD;  Location: [facility];  Service: Gynecology;  Laterality: Bilateral;  . fibroidectomy     vaginally  . HYSTEROSCOPY WITH D & C N/A 05/16/2013   Procedure: DILATATION AND CURETTAGE /HYSTEROSCOPY  Removal of [name];  Surgeon: Osborne Oman, MD;  Location: [facility];  Service: Gynecology;  Laterality: N/A;   . OVARIAN CYST REMOVAL    . VAGINAL HYSTERECTOMY N/A 12/18/2015   Procedure: HYSTERECTOMY VAGINAL;  Surgeon: Woodroe Mode, MD;  Location: [facility];  Service: Gynecology;  Laterality: N/A;    ROS: Review of Systems Negative except as stated above  PHYSICAL EXAM: BP 134/81   Pulse 71   Resp 16   Wt 175 lb 12.8 oz (79.7 kg)   LMP  (LMP Unknown)   SpO2 99%   BMI 32.15 kg/m   Wt Readings from Last 3 Encounters:  01/22/21 175 lb 12.8 oz (79.7 kg)  08/17/20 177 lb 6.4 oz (80.5 kg)  07/30/20 180 lb (81.6 kg)  BP 135/87  Physical Exam  General appearance - alert, well appearing, and in no distress Mental status - normal mood, behavior, speech, dress, motor activity, and thought processes Chest - clear to auscultation, no wheezes, rales or rhonchi, symmetric air entry Heart - normal rate, regular rhythm, normal S1, S2, no murmurs, rubs, clicks or gallops Musculoskeletal -mild tenderness on palpation of the lumbar spine and surrounding paraspinal muscles.  Straight leg raise reproduces pain in the lower back.  Power in the lower extremities 5/5 proximally and distally.  Reflexes are brisk.   Extremities - peripheral pulses normal, no pedal edema, no clubbing or cyanosis Diabetic Foot Exam - Simple   Simple Foot Form Visual Inspection No deformities, no ulcerations, no other skin breakdown bilaterally: Yes Sensation Testing Intact to touch and monofilament testing bilaterally: Yes Pulse Check Posterior Tibialis and Dorsalis pulse intact bilaterally: Yes Comments     Depression screen [facility] 2/9 08/17/2020 08/10/2018 07/16/2018  Decreased Interest '3 2 2  ' Down, Depressed, Hopeless '2 3 2  ' PHQ - 2 Score '5 5 4  ' Altered sleeping '3 3 2  ' Tired, decreased energy '3 3 3  ' Change in appetite '3 3 2  ' Feeling bad or failure about yourself  '3 3 2  ' Trouble concentrating '3 3 3  ' Moving slowly or fidgety/restless '3 3 2  ' Suicidal thoughts 2 1 0  PHQ-9 Score '25 24 18     ' CMP Latest Ref Rng &  Units 08/17/2020 07/30/2020 06/24/2018  Glucose 65 - 99 mg/dL 98 103(H) 99  BUN 6 - 24 mg/dL 12 - 12  Creatinine 0.57 - 1.00 mg/dL 0.75 - 0.82  Sodium 134 - 144 mmol/L 139 - 140  Potassium 3.5 - 5.2 mmol/L 4.1 - 3.9  Chloride 96 - 106 mmol/L 105 - 104  CO2 20 - 29 mmol/L 20 - 20  Calcium 8.7 - 10.2 mg/dL 9.2 - 8.9  Total Protein 6.0 - 8.5 g/dL 7.2 - 6.6  Total Bilirubin 0.0 - 1.2 mg/dL 0.9 - 0.7  Alkaline Phos  44 - 121 IU/L 86 - 83  AST 0 - 40 IU/L 21 - 21  ALT 0 - 32 IU/L 23 - 22   Lipid Panel     Component Value Date/Time   CHOL 196 07/30/2020 0954   TRIG 218 (H) 07/30/2020 0954   HDL 42 07/30/2020 0954   CHOLHDL 4.7 (H) 07/30/2020 0954   CHOLHDL 3.4 Ratio 12/01/2007 2012   VLDL 32 12/01/2007 2012   LDLCALC 116 (H) 07/30/2020 0954    CBC    Component Value Date/Time   WBC 7.7 06/24/2018 1522   WBC 15.2 (H) 12/19/2015 0553   RBC 4.50 06/24/2018 1522   RBC 4.03 12/19/2015 0553   HGB 12.5 06/24/2018 1522   HCT 38.7 06/24/2018 1522   PLT 286 06/24/2018 1522   MCV 86 06/24/2018 1522   MCH 27.8 06/24/2018 1522   MCH 25.8 (L) 12/19/2015 0553   MCHC 32.3 06/24/2018 1522   MCHC 33.4 12/19/2015 0553   RDW 13.0 06/24/2018 1522   LYMPHSABS 2.1 06/24/2018 1522   MONOABS 0.8 08/12/2015 1850   EOSABS 0.1 06/24/2018 1522   BASOSABS 0.0 06/24/2018 1522    ASSESSMENT AND PLAN: 1. Type 2 diabetes mellitus with obesity (HCC) At goal.  Continue current dose of metformin, healthy eating habits and regular exercise.  Printed information given on healthy eating habits. - POCT glucose (manual entry) - POCT glycosylated hemoglobin (Hb A1C) - Microalbumin / creatinine urine ratio - metFORMIN (GLUCOPHAGE) 500 MG tablet; TAKE 1 TABLET (500 MG TOTAL) BY MOUTH 2 (TWO) TIMES DAILY WITH A MEAL.  Dispense: 180 tablet; Refill: 0  2. Chronic bilateral low back pain without sciatica Questionably due to arthritis or degenerative disc aggravated by prolonged standing and some lifting at work.   Advised to avoid heavy lifting and excessive bending.  Use a heating pad in the evenings when sitting down at home.  We will get some baseline x-rays.  Continue ibuprofen as needed.  I have given refill on Flexeril.  Warned that it can cause drowsiness.  Advised to update orange card/cone discount as we may refer her for some physical therapy. - DG Lumbar Spine Complete; Future - ibuprofen (ADVIL) 800 MG tablet; Take 1 tablet (800 mg total) by mouth every 8 (eight) hours as needed.  Dispense: 60 tablet; Refill: 1 - cyclobenzaprine (FLEXERIL) 5 MG tablet; Take 1 tablet (5 mg total) by mouth daily as needed for muscle spasms.  Dispense: 30 tablet; Refill: 1  3. Elevated blood-pressure reading without diagnosis of hypertension DASH diet discussed and encouraged.  We will have her follow-up with the clinical pharmacist in about 1 month for repeat blood pressure check.  Ibuprofen may be playing a role.    Patient was given the opportunity to ask questions.  Patient verbalized understanding of the plan and was able to repeat key elements of the plan.  AMN Language interpreter used during this encounter. #[id number], Howell Pringle  Orders Placed This Encounter  Procedures  . DG Lumbar Spine Complete  . Microalbumin / creatinine urine ratio  . POCT glucose (manual entry)  . POCT glycosylated hemoglobin (Hb A1C)     Requested Prescriptions   Signed Prescriptions Disp Refills  . metFORMIN (GLUCOPHAGE) 500 MG tablet 180 tablet 0    Sig: TAKE 1 TABLET (500 MG TOTAL) BY MOUTH 2 (TWO) TIMES DAILY WITH A MEAL.  Marland Kitchen ibuprofen (ADVIL) 800 MG tablet 60 tablet 1    Sig: Take 1 tablet (800 mg total) by mouth every 8 (  eight) hours as needed.  . cyclobenzaprine (FLEXERIL) 5 MG tablet 30 tablet 1    Sig: Take 1 tablet (5 mg total) by mouth daily as needed for muscle spasms.    Return in about 4 months (around 05/24/2021) for Give appt with [facility] in 3 wks for BP check.  Karle Plumber, MD, FACP

## 2021-01-23 ENCOUNTER — Other Ambulatory Visit: Payer: Self-pay

## 2021-01-24 ENCOUNTER — Ambulatory Visit (HOSPITAL_COMMUNITY)
Admission: RE | Admit: 2021-01-24 | Discharge: 2021-01-24 | Disposition: A | Discharge status: Home / Self Care | Payer: No Typology Code available for payment source | Source: Ambulatory Visit | Attending: Internal Medicine | Admitting: Internal Medicine

## 2021-01-24 ENCOUNTER — Other Ambulatory Visit: Payer: Self-pay

## 2021-01-24 DIAGNOSIS — M545 Low back pain, unspecified: Secondary | ICD-10-CM | POA: Insufficient documentation

## 2021-01-24 DIAGNOSIS — G8929 Other chronic pain: Secondary | ICD-10-CM | POA: Insufficient documentation

## 2021-01-25 ENCOUNTER — Other Ambulatory Visit: Payer: Self-pay | Admitting: Internal Medicine

## 2021-01-25 DIAGNOSIS — M47816 Spondylosis without myelopathy or radiculopathy, lumbar region: Secondary | ICD-10-CM

## 2021-01-25 DIAGNOSIS — G8929 Other chronic pain: Secondary | ICD-10-CM

## 2021-01-25 NOTE — Progress Notes (Signed)
Let patient know that the x-ray of the lower back shows that she has some degenerative arthritis changes and mild curvature of the spine called scoliosis.  I will refer her for some physical therapy.

## 2021-02-12 ENCOUNTER — Ambulatory Visit: Payer: Self-pay | Attending: Internal Medicine | Admitting: Pharmacist

## 2021-02-12 ENCOUNTER — Encounter: Payer: Self-pay | Admitting: Pharmacist

## 2021-02-12 ENCOUNTER — Other Ambulatory Visit: Payer: Self-pay

## 2021-02-12 VITALS — BP 104/69 | HR 88

## 2021-02-12 DIAGNOSIS — R030 Elevated blood-pressure reading, without diagnosis of hypertension: Secondary | ICD-10-CM

## 2021-02-12 NOTE — Progress Notes (Signed)
   S:    PCP: Dr. Wynetta Emery   Patient arrives in good spirits. Presents to the clinic for hypertension evaluation, counseling, and management. Patient was referred and last seen by Primary Care Provider on 01/22/2021.   Medication adherence: she does not currently take anti-hypertensive medications. Of note, she is trying to use ibuprofen sparingly.   Current BP Medications include:  None   Antihypertensives tried in the past include: none   Dietary habits include: denies eating sodium-heavy foods; does drink coffee but limits this to the morning Exercise habits include: tries to walk every other day Family / Social history:  Fhx: DM Tobacco: never smoker  Alcohol: denies use   O: Vitals:   02/12/21 1519  BP: 104/69  Pulse: 88   Home BP readings: none   Last 3 Office BP readings: BP Readings from Last 3 Encounters:  02/12/21 104/69  01/22/21 134/81  08/17/20 136/80   BMET    Component Value Date/Time   NA 139 08/17/2020 0947   K 4.1 08/17/2020 0947   CL 105 08/17/2020 0947   CO2 20 08/17/2020 0947   GLUCOSE 98 08/17/2020 0947   GLUCOSE 125 (H) 12/11/2015 1105   BUN 12 08/17/2020 0947   CREATININE 0.75 08/17/2020 0947   CALCIUM 9.2 08/17/2020 0947   GFRNONAA 96 08/17/2020 0947   GFRAA 111 08/17/2020 0947    Renal function: CrCl cannot be calculated (Patient's most recent lab result is older than the maximum 21 days allowed.).  Clinical ASCVD: No  The 10-year ASCVD risk score Mikey Bussing DC Jr., et al., 2013) is: 1.8%   Values used to calculate the score:     Age: 33 years     Sex: Female     Is Non-Hispanic African American: No     Diabetic: Yes     Tobacco smoker: No     Systolic Blood Pressure: 970 mmHg     Is BP treated: No     HDL Cholesterol: 42 mg/dL     Total Cholesterol: 196 mg/dL  A/P: Hypertension undiagnosed. Her BP today is normotensive. I trended her BP values over the past several years and most readings are normal. She was elevated at her last  PCP appointment but reports that this could have been due to stress. Additionally, she is limiting her ibuprofen use. -No medications for now.    -Counseled on lifestyle modifications for blood pressure control including reduced dietary sodium, increased exercise, adequate sleep.  Results reviewed and written information provided.   Total time in face-to-face counseling 30 minutes.   F/U Clinic Visit with PCP.    Benard Halsted, PharmD, [place], [place] [phone]

## 2021-04-17 ENCOUNTER — Other Ambulatory Visit: Payer: Self-pay

## 2021-05-24 ENCOUNTER — Other Ambulatory Visit: Payer: Self-pay

## 2021-05-24 ENCOUNTER — Ambulatory Visit: Payer: Self-pay | Attending: Internal Medicine | Admitting: Internal Medicine

## 2021-05-24 VITALS — BP 114/78 | HR 88 | Resp 16 | Wt 179.8 lb

## 2021-05-24 DIAGNOSIS — Z1211 Encounter for screening for malignant neoplasm of colon: Secondary | ICD-10-CM

## 2021-05-24 DIAGNOSIS — E669 Obesity, unspecified: Secondary | ICD-10-CM

## 2021-05-24 DIAGNOSIS — E1169 Type 2 diabetes mellitus with other specified complication: Secondary | ICD-10-CM

## 2021-05-24 DIAGNOSIS — E782 Mixed hyperlipidemia: Secondary | ICD-10-CM

## 2021-05-24 LAB — GLUCOSE, POCT (MANUAL RESULT ENTRY): POC Glucose: 98 mg/dl (ref 70–99)

## 2021-05-24 NOTE — Progress Notes (Signed)
Patient ID: Theresa Barry, female    DOB: 11-May-1976  MRN: [id number]  CC: Diabetes   Subjective: Theresa Barry is a 45 y.o. female who presents for chronic disease management Her concerns today include:  Pt with hx of DM,  H.pylori, polyarthralgia   DIABETES TYPE 2 Last A1C:   Results for orders placed or performed in visit on 05/24/21  POCT glucose (manual entry)  Result Value Ref Range   POC Glucose 98 70 - 99 mg/dl    Lab Results  Component Value Date   HGBA1C 6.0 01/22/2021    Med Adherence:  '[x]'  Yes she is on metformin.   '[]'  No Medication side effects:  '[]'  Yes    '[x]'  No Home Monitoring?  '[]'  Yes    '[x]'  No Home glucose results range: Diet Adherence: '[x]'  Yes    '[]'  No Exercise: '[x]'  Yes 2-3x/wk   '[]'  No Hypoglycemic episodes?: '[]'  Yes    '[x]'  No Numbness of the feet? '[]'  Yes    '[]'  No Retinopathy hx? '[]'  Yes    '[]'  No Last eye exam: over due for  exam Comments:   HL: Last lipid profile done in October of last year revealed an LDL of 116.  She is not on statin therapy.  HM: due for colon CA screen and urine microalbumin. Patient Active Problem List   Diagnosis Date Noted   Breast pain, left 03/31/2019   Screening breast examination 12/18/2017   Chronic radicular pain of lower back 05/19/2017   Prolapsing fibroid of cervix 05/16/2013   LIPOMA 08/16/2007     Current Outpatient Medications on File Prior to Visit  Medication Sig Dispense Refill   Blood Glucose Monitoring Suppl (TRUE METRIX METER) w/Device KIT Use as directed 1 kit 0   cyclobenzaprine (FLEXERIL) 5 MG tablet Take 1 tablet (5 mg total) by mouth daily as needed for muscle spasms. 30 tablet 1   glucose blood (TRUE METRIX BLOOD GLUCOSE TEST) test strip Use as instructed 100 each 12   ibuprofen (ADVIL) 800 MG tablet Take 1 tablet (800 mg total) by mouth every 8 (eight) hours as needed. 60 tablet 1   metFORMIN (GLUCOPHAGE) 500 MG tablet TAKE 1 TABLET (500 MG TOTAL) BY MOUTH 2 (TWO) TIMES DAILY  WITH A MEAL. 180 tablet 0   Multiple Vitamin (MULTIVITAMIN) capsule Take 1 capsule by mouth daily.     TRUEplus Lancets 28G MISC Use as directed 100 each 4   No current facility-administered medications on file prior to visit.    No Known Allergies  Social History   Socioeconomic History   Marital status: Married    Spouse name: Not on file   Number of children: 3   Years of education: Not on file   Highest education level: High school graduate  Occupational History   Not on file  Tobacco Use   Smoking status: Never   Smokeless tobacco: Never  Vaping Use   Vaping Use: Never used  Substance and Sexual Activity   Alcohol use: No   Drug use: No   Sexual activity: Not Currently    Birth control/protection: Surgical  Other Topics Concern   Not on file  Social History Narrative   Not on file   Social Determinants of Health   Financial Resource Strain: Not on file  Food Insecurity: Not on file  Transportation Needs: No Transportation Needs   Lack of Transportation (Medical): No   Lack of Transportation (Non-Medical): No  Physical Activity: Not on  file  Stress: Not on file  Social Connections: Not on file  Intimate Partner Violence: Not on file    Family History  Problem Relation Age of Onset   Diabetes Mother    Cancer Mother    Ovarian cancer Mother    Diabetes Brother    Cancer Brother    Breast cancer Paternal Aunt    Breast cancer Maternal Aunt     Past Surgical History:  Procedure Laterality Date   ABDOMINAL HYSTERECTOMY     BILATERAL SALPINGECTOMY Bilateral 12/18/2015   Procedure: BILATERAL SALPINGECTOMY;  Surgeon: Woodroe Mode, MD;  Location: [facility];  Service: Gynecology;  Laterality: Bilateral;   fibroidectomy     vaginally   HYSTEROSCOPY WITH D & C N/A 05/16/2013   Procedure: DILATATION AND CURETTAGE /HYSTEROSCOPY  Removal of [name];  Surgeon: Osborne Oman, MD;  Location: [facility];  Service: Gynecology;  Laterality: N/A;   OVARIAN CYST  REMOVAL     VAGINAL HYSTERECTOMY N/A 12/18/2015   Procedure: HYSTERECTOMY VAGINAL;  Surgeon: Woodroe Mode, MD;  Location: [facility];  Service: Gynecology;  Laterality: N/A;    ROS: Review of Systems Negative except as stated above  PHYSICAL EXAM: BP 114/78   Pulse 88   Resp 16   Wt 179 lb 12.8 oz (81.6 kg)   LMP  (LMP Unknown)   SpO2 100%   BMI 32.89 kg/m   Physical Exam  General appearance - alert, well appearing, and in no distress Mental status - normal mood, behavior, speech, dress, motor activity, and thought processes Neck - supple, no significant adenopathy Chest - clear to auscultation, no wheezes, rales or rhonchi, symmetric air entry Heart - normal rate, regular rhythm, normal S1, S2, no murmurs, rubs, clicks or gallops Extremities - peripheral pulses normal, no pedal edema, no clubbing or cyanosis   CMP Latest Ref Rng & Units 08/17/2020 07/30/2020 06/24/2018  Glucose 65 - 99 mg/dL 98 103(H) 99  BUN 6 - 24 mg/dL 12 - 12  Creatinine 0.57 - 1.00 mg/dL 0.75 - 0.82  Sodium 134 - 144 mmol/L 139 - 140  Potassium 3.5 - 5.2 mmol/L 4.1 - 3.9  Chloride 96 - 106 mmol/L 105 - 104  CO2 20 - 29 mmol/L 20 - 20  Calcium 8.7 - 10.2 mg/dL 9.2 - 8.9  Total Protein 6.0 - 8.5 g/dL 7.2 - 6.6  Total Bilirubin 0.0 - 1.2 mg/dL 0.9 - 0.7  Alkaline Phos 44 - 121 IU/L 86 - 83  AST 0 - 40 IU/L 21 - 21  ALT 0 - 32 IU/L 23 - 22   Lipid Panel     Component Value Date/Time   CHOL 196 07/30/2020 0954   TRIG 218 (H) 07/30/2020 0954   HDL 42 07/30/2020 0954   CHOLHDL 4.7 (H) 07/30/2020 0954   CHOLHDL 3.4 Ratio 12/01/2007 2012   VLDL 32 12/01/2007 2012   LDLCALC 116 (H) 07/30/2020 0954    CBC    Component Value Date/Time   WBC 7.7 06/24/2018 1522   WBC 15.2 (H) 12/19/2015 0553   RBC 4.50 06/24/2018 1522   RBC 4.03 12/19/2015 0553   HGB 12.5 06/24/2018 1522   HCT 38.7 06/24/2018 1522   PLT 286 06/24/2018 1522   MCV 86 06/24/2018 1522   MCH 27.8 06/24/2018 1522   MCH 25.8 (L)  12/19/2015 0553   MCHC 32.3 06/24/2018 1522   MCHC 33.4 12/19/2015 0553   RDW 13.0 06/24/2018 1522   LYMPHSABS 2.1 06/24/2018  1522   MONOABS 0.8 08/12/2015 1850   EOSABS 0.1 06/24/2018 1522   BASOSABS 0.0 06/24/2018 1522    ASSESSMENT AND PLAN: 1. Type 2 diabetes mellitus with obesity (HCC) Level of control unknown as patient has not been checking blood sugars.  However her last A1c was at goal.  She will continue metformin.  Encouraged her to continue healthy eating habits and regular exercise. -Encouraged her to get eye exam done.  Given the names of cost-effective places to have eye exam done. - POCT glucose (manual entry) - Hemoglobin A1c; Future  2. Mixed hyperlipidemia She will come fasting to the lab next week to have lipid profile checked. - Lipid panel; Future - Microalbumin / creatinine urine ratio; Future - Hepatic Function Panel; Future  3. Screening for colon cancer Discussed colon cancer screening methods.  She is uninsured.  She is agreeable to doing the fit test. - Fecal occult blood, imunochemical([facility])   Patient was given the opportunity to ask questions.  Patient verbalized understanding of the plan and was able to repeat key elements of the plan.  [employer] Language interpreter used during this encounter. #[id number] Busman  Orders Placed This Encounter  Procedures   Fecal occult blood, imunochemical([facility])   Lipid panel   Microalbumin / creatinine urine ratio   Hepatic Function Panel   Hemoglobin A1c   POCT glucose (manual entry)     Requested Prescriptions    No prescriptions requested or ordered in this encounter    Return in about 4 months (around 09/23/2021) for physical.  Karle Plumber, MD, Rosalita Chessman

## 2021-05-30 ENCOUNTER — Other Ambulatory Visit: Payer: Self-pay

## 2021-06-03 ENCOUNTER — Ambulatory Visit: Payer: Self-pay | Attending: Internal Medicine

## 2021-06-03 ENCOUNTER — Other Ambulatory Visit: Payer: Self-pay

## 2021-06-03 DIAGNOSIS — E1169 Type 2 diabetes mellitus with other specified complication: Secondary | ICD-10-CM

## 2021-06-03 DIAGNOSIS — E669 Obesity, unspecified: Secondary | ICD-10-CM

## 2021-06-03 DIAGNOSIS — E782 Mixed hyperlipidemia: Secondary | ICD-10-CM

## 2021-06-04 LAB — HEPATIC FUNCTION PANEL
ALT: 46 IU/L — ABNORMAL HIGH (ref 0–32)
AST: 41 IU/L — ABNORMAL HIGH (ref 0–40)
Albumin: 4.7 g/dL (ref 3.8–4.8)
Alkaline Phosphatase: 96 IU/L (ref 44–121)
Bilirubin Total: 0.6 mg/dL (ref 0.0–1.2)
Bilirubin, Direct: 0.16 mg/dL (ref 0.00–0.40)
Total Protein: 7.1 g/dL (ref 6.0–8.5)

## 2021-06-04 LAB — HEMOGLOBIN A1C
Est. average glucose Bld gHb Est-mCnc: 134 mg/dL
Hgb A1c MFr Bld: 6.3 % — ABNORMAL HIGH (ref 4.8–5.6)

## 2021-06-04 LAB — MICROALBUMIN / CREATININE URINE RATIO
Creatinine, Urine: 39.2 mg/dL
Microalb/Creat Ratio: <8 mg/g creat (ref 0–29)
Microalbumin, Urine: <3 ug/mL

## 2021-06-04 LAB — LIPID PANEL
Chol/HDL Ratio: 4.2 ratio (ref 0.0–4.4)
Cholesterol, Total: 203 mg/dL — ABNORMAL HIGH (ref 100–199)
HDL: 48 mg/dL (ref >39)
LDL Chol Calc (NIH): 112 mg/dL — ABNORMAL HIGH (ref 0–99)
Triglycerides: 251 mg/dL — ABNORMAL HIGH (ref 0–149)
VLDL Cholesterol Cal: 43 mg/dL — ABNORMAL HIGH (ref 5–40)

## 2021-06-05 ENCOUNTER — Other Ambulatory Visit: Payer: Self-pay | Admitting: Internal Medicine

## 2021-06-05 ENCOUNTER — Encounter: Payer: Self-pay | Admitting: Internal Medicine

## 2021-06-05 DIAGNOSIS — R945 Abnormal results of liver function studies: Secondary | ICD-10-CM

## 2021-06-05 DIAGNOSIS — R7989 Other specified abnormal findings of blood chemistry: Secondary | ICD-10-CM

## 2021-06-05 DIAGNOSIS — E1169 Type 2 diabetes mellitus with other specified complication: Secondary | ICD-10-CM | POA: Insufficient documentation

## 2021-06-05 DIAGNOSIS — E785 Hyperlipidemia, unspecified: Secondary | ICD-10-CM | POA: Insufficient documentation

## 2021-06-05 LAB — FECAL OCCULT BLOOD, IMMUNOCHEMICAL: Fecal Occult Bld: NEGATIVE

## 2021-06-05 NOTE — Progress Notes (Signed)
A1c is 6.3 meaning diabetes is under good control. LDL cholesterol is 112 with goal being less than 75. Mild elevation in 2 of her liver function tests.  This may be due to increased fat deposited in the liver which can be seen in patients with diabetes who are overweight.  Healthy eating habits and regular exercise will help to correct fatty liver and lower cholesterol.  I would like to put her on medication to help lower the cholesterol but first please return to the lab for additional blood tests to rule out hepatitis as a cause for the elevated liver function test.  Orders entered.

## 2021-06-06 ENCOUNTER — Telehealth: Payer: Self-pay

## 2021-06-06 NOTE — Telephone Encounter (Signed)
[place] Id# [phone]  contacted pt to go over lab results  pt is aware and doesn't have any questions or concerns

## 2021-06-11 ENCOUNTER — Other Ambulatory Visit: Payer: Self-pay

## 2021-06-11 ENCOUNTER — Ambulatory Visit: Payer: Self-pay | Attending: Internal Medicine

## 2021-06-11 DIAGNOSIS — R945 Abnormal results of liver function studies: Secondary | ICD-10-CM

## 2021-06-11 DIAGNOSIS — R7989 Other specified abnormal findings of blood chemistry: Secondary | ICD-10-CM

## 2021-06-12 ENCOUNTER — Other Ambulatory Visit: Payer: Self-pay

## 2021-06-12 ENCOUNTER — Other Ambulatory Visit: Payer: Self-pay | Admitting: Internal Medicine

## 2021-06-12 DIAGNOSIS — R7989 Other specified abnormal findings of blood chemistry: Secondary | ICD-10-CM

## 2021-06-12 DIAGNOSIS — R945 Abnormal results of liver function studies: Secondary | ICD-10-CM

## 2021-06-12 LAB — HEPATITIS C ANTIBODY: Hep C Virus Ab: <0.1 s/co ratio (ref 0.0–0.9)

## 2021-06-12 LAB — HEPATITIS B SURFACE ANTIBODY, QUANTITATIVE: Hepatitis B Surf Ab Quant: <3.1 m[IU]/mL — ABNORMAL LOW (ref >9.9)

## 2021-06-12 LAB — HEPATITIS B SURFACE ANTIGEN: Hepatitis B Surface Ag: NEGATIVE

## 2021-06-12 MED ORDER — ATORVASTATIN CALCIUM 10 MG PO TABS
10.0000 mg | ORAL_TABLET | Freq: Every day | ORAL | 1 refills | Status: DC
  Filled 2021-06-12: qty 30, 30d supply, fill #0

## 2021-06-12 NOTE — Progress Notes (Signed)
Let patient know that her screening test for hepatitis B and C are negative.  I would like to start her on a low-dose of medication to help lower the cholesterol call atorvastatin.  After she has been on the medication for 2 weeks, she should return to the lab to have repeat study done to check the liver function tests again to make sure that they are staying stable on this medication.  I have sent the prescription for the cholesterol medicine to our pharmacy.  I have also put in the order for the liver function tests blood draw.

## 2021-06-13 ENCOUNTER — Ambulatory Visit: Payer: Self-pay | Admitting: *Deleted

## 2021-06-13 NOTE — Telephone Encounter (Signed)
Pt called in using interpreter for [facility] [id number].  Lab result message from Dr. Karle Plumber given dated 06/12/2021 at 10:51 AM.   And the FIT test for colon cancer 06/05/2021 at 10:30 AM.  I scheduled her for 06/27/2021 at 8:30 for lab draw.   I called pt back using [place] [phone] to schedule the appt.

## 2021-06-19 ENCOUNTER — Other Ambulatory Visit: Payer: Self-pay

## 2021-06-27 ENCOUNTER — Other Ambulatory Visit: Payer: No Typology Code available for payment source

## 2021-07-17 ENCOUNTER — Other Ambulatory Visit: Payer: Self-pay | Admitting: Internal Medicine

## 2021-07-17 DIAGNOSIS — E1169 Type 2 diabetes mellitus with other specified complication: Secondary | ICD-10-CM

## 2021-07-17 DIAGNOSIS — E669 Obesity, unspecified: Secondary | ICD-10-CM

## 2021-07-18 ENCOUNTER — Other Ambulatory Visit: Payer: Self-pay

## 2021-07-18 MED ORDER — METFORMIN HCL 500 MG PO TABS
ORAL_TABLET | Freq: Two times a day (BID) | ORAL | 1 refills | Status: DC
  Filled 2021-07-18: qty 60, 30d supply, fill #0
  Filled 2021-08-27 – 2021-09-05 (×2): qty 60, 30d supply, fill #1
  Filled 2021-10-15: qty 60, 30d supply, fill #2
  Filled 2021-10-15: qty 60, 30d supply, fill #0
  Filled 2021-11-19: qty 60, 30d supply, fill #1
  Filled 2021-12-26 – 2021-12-27 (×2): qty 60, 30d supply, fill #2
  Filled 2022-01-27: qty 60, 30d supply, fill #3

## 2021-07-18 NOTE — Telephone Encounter (Signed)
Requested Prescriptions  Pending Prescriptions Disp Refills  . metFORMIN (GLUCOPHAGE) 500 MG tablet 180 tablet 1    Sig: TAKE 1 TABLET (500 MG TOTAL) BY MOUTH 2 (TWO) TIMES DAILY WITH A MEAL.     Endocrinology:  Diabetes - Biguanides Passed - 07/17/2021  3:00 PM      Passed - Cr in normal range and within 360 days    Creatinine, Ser  Date Value Ref Range Status  08/17/2020 0.75 0.57 - 1.00 mg/dL Final   Creatinine,U  Date Value Ref Range Status  08/01/2008 131.5 mg/dL Final    Comment:    See lab report for associated comment(s)         Passed - HBA1C is between 0 and 7.9 and within 180 days    HbA1c, POC (controlled diabetic range)  Date Value Ref Range Status  01/22/2021 6.0 0.0 - 7.0 % Final   Hgb A1c MFr Bld  Date Value Ref Range Status  06/03/2021 6.3 (H) 4.8 - 5.6 % Final    Comment:             Prediabetes: 5.7 - 6.4          Diabetes: >6.4          Glycemic control for adults with diabetes: <7.0          Passed - eGFR in normal range and within 360 days    GFR calc Af Amer  Date Value Ref Range Status  08/17/2020 111 >59 mL/min/1.73 Final    Comment:    **In accordance with recommendations from the NKF-ASN Task force,**   [facility] is in the process of updating its eGFR calculation to the   2021 CKD-EPI creatinine equation that estimates kidney function   without a race variable.    GFR calc non Af Amer  Date Value Ref Range Status  08/17/2020 96 >59 mL/min/1.73 Final         Passed - Valid encounter within last 6 months    Recent Outpatient Visits          1 month ago Type 2 diabetes mellitus with obesity ([place])   [place], Neoma Laming B, MD   5 months ago Elevated blood-pressure reading without diagnosis of hypertension   [place], Annie Main L, RPH-CPP   5 months ago Type 2 diabetes mellitus with obesity [facility])   [place] Karle Plumber B,  MD   11 months ago Type 2 diabetes mellitus without complication, without long-term current use of insulin [facility])   [place], [place], NP   2 years ago H. pylori infection   [place], MD      Future Appointments            In 2 months Wynetta Emery Dalbert Batman, MD [place]

## 2021-08-27 ENCOUNTER — Other Ambulatory Visit: Payer: Self-pay

## 2021-09-03 ENCOUNTER — Other Ambulatory Visit: Payer: Self-pay

## 2021-09-05 ENCOUNTER — Other Ambulatory Visit: Payer: Self-pay

## 2021-09-23 ENCOUNTER — Ambulatory Visit: Payer: Self-pay | Admitting: Internal Medicine

## 2021-10-15 ENCOUNTER — Other Ambulatory Visit: Payer: Self-pay

## 2021-10-17 ENCOUNTER — Other Ambulatory Visit: Payer: Self-pay

## 2021-11-07 ENCOUNTER — Other Ambulatory Visit: Payer: Self-pay | Admitting: Obstetrics and Gynecology

## 2021-11-07 DIAGNOSIS — Z1231 Encounter for screening mammogram for malignant neoplasm of breast: Secondary | ICD-10-CM

## 2021-11-19 ENCOUNTER — Other Ambulatory Visit: Payer: Self-pay

## 2021-11-21 ENCOUNTER — Ambulatory Visit: Payer: Self-pay | Attending: Internal Medicine | Admitting: Internal Medicine

## 2021-11-21 ENCOUNTER — Encounter: Payer: Self-pay | Admitting: Internal Medicine

## 2021-11-21 ENCOUNTER — Other Ambulatory Visit: Payer: Self-pay

## 2021-11-21 VITALS — BP 113/78 | HR 19 | Resp 16 | Wt 181.2 lb

## 2021-11-21 DIAGNOSIS — Z23 Encounter for immunization: Secondary | ICD-10-CM

## 2021-11-21 DIAGNOSIS — F411 Generalized anxiety disorder: Secondary | ICD-10-CM

## 2021-11-21 DIAGNOSIS — E785 Hyperlipidemia, unspecified: Secondary | ICD-10-CM

## 2021-11-21 DIAGNOSIS — Z1331 Encounter for screening for depression: Secondary | ICD-10-CM | POA: Insufficient documentation

## 2021-11-21 DIAGNOSIS — E669 Obesity, unspecified: Secondary | ICD-10-CM

## 2021-11-21 DIAGNOSIS — R002 Palpitations: Secondary | ICD-10-CM

## 2021-11-21 DIAGNOSIS — E1169 Type 2 diabetes mellitus with other specified complication: Secondary | ICD-10-CM

## 2021-11-21 DIAGNOSIS — R7989 Other specified abnormal findings of blood chemistry: Secondary | ICD-10-CM

## 2021-11-21 LAB — POCT GLYCOSYLATED HEMOGLOBIN (HGB A1C): HbA1c, POC (controlled diabetic range): 6.3 % (ref 0.0–7.0)

## 2021-11-21 MED ORDER — SERTRALINE HCL 25 MG PO TABS
25.0000 mg | ORAL_TABLET | Freq: Every day | ORAL | 1 refills | Status: DC
  Filled 2021-11-21: qty 30, 30d supply, fill #0
  Filled 2021-12-26: qty 30, 30d supply, fill #1

## 2021-11-21 MED ORDER — HYDROXYZINE PAMOATE 25 MG PO CAPS
25.0000 mg | ORAL_CAPSULE | Freq: Every evening | ORAL | 1 refills | Status: AC | PRN
  Filled 2021-11-21: qty 30, 30d supply, fill #0

## 2021-11-21 NOTE — Progress Notes (Signed)
Patient ID: Theresa Barry, female    DOB: 1976-01-15  MRN: [id number]  CC: chronic ds management  Subjective: Theresa Barry is a 46 y.o. female who presents for chronic ds managment Her concerns today include:  Pt with hx of DM,  HL, H.pylori, polyarthralgia   DIABETES TYPE 2 Last A1C:    Med Adherence:  _0  Yes -on Metformin but ran out yesterday  Medication side effects:  _1  Yes    _2  No Home Monitoring?  _3  Yes    _4  No Home glucose results range:NA Diet Adherence: _5  Yes -tries to eat healthy but sometimes she does not do as well as she would like   Exercise: _6  Yes    _7  No Hypoglycemic episodes?: _8  Yes    _9  No Numbness of the feet? _10  Yes    _11  No Retinopathy hx? _12  Yes    _13  No Last eye exam: over due for eye exam Comments:   Complains of feeling anxious about her health especially diagnosis of diabetes.  She has problems falling asleep because she is so anxious thinking about diabetes and what can occur because of having it.  She obsesses about her eating habits and not getting in any exercise.  For few days last week she was having feeling of palpitations  HL: On last visit she was found to have mild elevation in AST and ALT thought to be due to to fatty liver associated with being diabetic and obese.  Screen for hepatitis was negative.  Cholesterol was elevated.  I recommended starting low-dose of atorvastatin 10 mg.  She was called by the RN but never did pick up the prescription from the pharmacy.    HM:  due for eye exam.  FIT ordered last visit was negative. Patient Active Problem List   Diagnosis Date Noted   Hyperlipidemia associated with type 2 diabetes mellitus ([place]) 06/05/2021   Abnormal LFTs 06/05/2021   Breast pain, left 03/31/2019   Screening breast examination 12/18/2017   Chronic radicular pain of lower back 05/19/2017   Prolapsing fibroid of cervix 05/16/2013   LIPOMA 08/16/2007     Current Outpatient Medications on File  Prior to Visit  Medication Sig Dispense Refill   atorvastatin (LIPITOR) 10 MG tablet Take 1 tablet (10 mg total) by mouth daily. 30 tablet 1   Blood Glucose Monitoring Suppl (TRUE METRIX METER) w/Device KIT Use as directed 1 kit 0   cyclobenzaprine (FLEXERIL) 5 MG tablet Take 1 tablet (5 mg total) by mouth daily as needed for muscle spasms. 30 tablet 1   glucose blood (TRUE METRIX BLOOD GLUCOSE TEST) test strip Use as instructed 100 each 12   ibuprofen (ADVIL) 800 MG tablet Take 1 tablet (800 mg total) by mouth every 8 (eight) hours as needed. 60 tablet 1   metFORMIN (GLUCOPHAGE) 500 MG tablet TAKE 1 TABLET (500 MG TOTAL) BY MOUTH 2 (TWO) TIMES DAILY WITH A MEAL. 180 tablet 1   Multiple Vitamin (MULTIVITAMIN) capsule Take 1 capsule by mouth daily.     TRUEplus Lancets 28G MISC Use as directed 100 each 4   No current facility-administered medications on file prior to visit.    No Known Allergies  Social History   Socioeconomic History   Marital status: Married    Spouse name: Not on file   Number of children: 3   Years of education: Not on file   Highest education level: High school graduate  Occupational History   Not on file  Tobacco Use   Smoking status: Never   Smokeless tobacco: Never  Vaping Use   Vaping Use: Never used  Substance and Sexual Activity   Alcohol use: No   Drug use: No   Sexual activity: Not Currently    Birth control/protection: Surgical  Other Topics Concern   Not on file  Social History Narrative   Not on file   Social Determinants of Health   Financial Resource Strain: Not on file  Food Insecurity: Not on file  Transportation Needs: Not on file  Physical Activity: Not on file  Stress: Not on file  Social Connections: Not on file  Intimate Partner Violence: Not on file    Family History  Problem Relation Age of Onset   Diabetes Mother    Cancer Mother    Ovarian cancer Mother    Diabetes Brother    Cancer Brother    Breast cancer  Paternal Aunt    Breast cancer Maternal Aunt     Past Surgical History:  Procedure Laterality Date   ABDOMINAL HYSTERECTOMY     BILATERAL SALPINGECTOMY Bilateral 12/18/2015   Procedure: BILATERAL SALPINGECTOMY;  Surgeon: Woodroe Mode, MD;  Location: [facility];  Service: Gynecology;  Laterality: Bilateral;   fibroidectomy     vaginally   HYSTEROSCOPY WITH D & C N/A 05/16/2013   Procedure: DILATATION AND CURETTAGE /HYSTEROSCOPY  Removal of [name];  Surgeon: Osborne Oman, MD;  Location: [facility];  Service: Gynecology;  Laterality: N/A;   OVARIAN CYST REMOVAL     VAGINAL HYSTERECTOMY N/A 12/18/2015   Procedure: HYSTERECTOMY VAGINAL;  Surgeon: Woodroe Mode, MD;  Location: [facility];  Service: Gynecology;  Laterality: N/A;    ROS: Review of Systems Negative except as stated above  PHYSICAL EXAM: BP 113/78    Pulse (!) 19    Resp 16    Wt 181 lb 3.2 oz (82.2 kg)    LMP  (LMP Unknown)    SpO2 96%    BMI 33.14 kg/m   Wt Readings from Last 3 Encounters:  11/21/21 181 lb 3.2 oz (82.2 kg)  05/24/21 179 lb 12.8 oz (81.6 kg)  01/22/21 175 lb 12.8 oz (79.7 kg)    Physical Exam  General appearance - alert, well appearing, and in no distress Mental status - normal mood, behavior, speech, dress, motor activity, and thought processes Chest - clear to auscultation, no wheezes, rales or rhonchi, symmetric air entry Heart - normal rate, regular rhythm, normal S1, S2, no murmurs, rubs, clicks or gallops Extremities - peripheral pulses normal, no pedal edema, no clubbing or cyanosis Diabetic Foot Exam - Simple   Simple Foot Form Diabetic Foot exam was performed with the following findings: Yes 11/21/2021  5:55 PM  Visual Inspection See comments: Yes Sensation Testing Intact to touch and monofilament testing bilaterally: Yes Pulse Check Posterior Tibialis and Dorsalis pulse intact bilaterally: Yes Comments     GAD 7 : Generalized Anxiety Score 11/21/2021 05/24/2021 08/17/2020 08/10/2018   Nervous, Anxious, on Edge _0 (No Data)  Control/stop worrying _1 (No Data)  Worry too much - different things _2 (No Data)  Trouble relaxing _3 (No Data)  Restless _4 (No Data)  Easily annoyed or irritable _5 (No Data)  Afraid - awful might happen _6 (No Data)  Total GAD 7 Score _7 -   Depression screen [facility] 2/9 11/21/2021 05/24/2021 08/17/2020  Decreased Interest  _0 Down, Depressed, Hopeless _1 PHQ - 2 Score _2 Altered sleeping _3 Tired, decreased energy _4 Change in appetite _5 Feeling bad or failure about yourself  _6 Trouble concentrating _7 Moving slowly or fidgety/restless _8 Suicidal thoughts _9 PHQ-9 Score _10 Some recent data might be hidden     CMP Latest Ref Rng & Units 06/03/2021 08/17/2020 07/30/2020  Glucose 65 - 99 mg/dL - 98 103(H)  BUN 6 - 24 mg/dL - 12 -  Creatinine 0.57 - 1.00 mg/dL - 0.75 -  Sodium 134 - 144 mmol/L - 139 -  Potassium 3.5 - 5.2 mmol/L - 4.1 -  Chloride 96 - 106 mmol/L - 105 -  CO2 20 - 29 mmol/L - 20 -  Calcium 8.7 - 10.2 mg/dL - 9.2 -  Total Protein 6.0 - 8.5 g/dL 7.1 7.2 -  Total Bilirubin 0.0 - 1.2 mg/dL 0.6 0.9 -  Alkaline Phos 44 - 121 IU/L 96 86 -  AST 0 - 40 IU/L 41(H) 21 -  ALT 0 - 32 IU/L 46(H) 23 -   Lipid Panel     Component Value Date/Time   CHOL 203 (H) 06/03/2021 1012   TRIG 251 (H) 06/03/2021 1012   HDL 48 06/03/2021 1012   CHOLHDL 4.2 06/03/2021 1012   CHOLHDL 3.4 Ratio 12/01/2007 2012   VLDL 32 12/01/2007 2012   LDLCALC 112 (H) 06/03/2021 1012    CBC    Component Value Date/Time   WBC 7.7 06/24/2018 1522   WBC 15.2 (H) 12/19/2015 0553   RBC 4.50 06/24/2018 1522   RBC 4.03 12/19/2015 0553   HGB 12.5 06/24/2018 1522   HCT 38.7 06/24/2018 1522   PLT 286 06/24/2018 1522   MCV 86 06/24/2018 1522   MCH 27.8 06/24/2018 1522   MCH 25.8 (L) 12/19/2015 0553   MCHC 32.3 06/24/2018 1522   MCHC 33.4 12/19/2015 0553   RDW 13.0 06/24/2018 1522    LYMPHSABS 2.1 06/24/2018 1522   MONOABS 0.8 08/12/2015 1850   EOSABS 0.1 06/24/2018 1522   BASOSABS 0.0 06/24/2018 1522   Results for orders placed or performed in visit on 11/21/21  POCT glycosylated hemoglobin (Hb A1C)  Result Value Ref Range   Hemoglobin A1C     HbA1c POC (<> result, manual entry)     HbA1c, POC (prediabetic range)     HbA1c, POC (controlled diabetic range) 6.3 0.0 - 7.0 %    ASSESSMENT AND PLAN: 1. Type 2 diabetes mellitus with obesity (HCC) At goal.  Continue metformin. Encouraged her to get in some form of moderate intensity exercise at least 3 to 5 days a week for 30 minutes.  Discussed and encourage healthy eating habits. - POCT glycosylated hemoglobin (Hb A1C) - CBC - Comprehensive metabolic panel  2. Hyperlipidemia associated with type 2 diabetes mellitus ([place]) Plan to recheck LFTs today.  If okay I told her that she should stop the atorvastatin.  3. Abnormal LFTs Likely due to fatty liver.  Discussed the importance of weight loss through healthy eating habits and regular exercise. - Comprehensive metabolic panel  4. GAD (generalized anxiety disorder) Inform patient that she should not be anxious about her diabetes.  The main thing with diabetes is keeping it under control to prevent complications.  Encourage healthy eating habits and regular exercise. -GAD-7  is positive.  She also has positive depression screen but feels that the main issue is anxiety.  She is agreeable to referral to behavioral health and starting medication.  I will start her on low-dose of Zoloft and have her follow-up in 7 weeks to see how she is doing on the medication. - Ambulatory referral to Psychiatry - sertraline (ZOLOFT) 25 MG tablet; Take 1 tablet (25 mg total) by mouth daily.  Dispense: 30 tablet; Refill: 1 - hydrOXYzine (VISTARIL) 25 MG capsule; Take 1 capsule (25 mg total) by mouth at bedtime as needed for anxiety.  Dispense: 30 capsule; Refill: 1  5.  Positive  depression screen See #4 above.  6. Palpitations Likely due to anxiety. - TSH+T4F+T3Free  7. Need for immunization against influenza - Flu Vaccine QUAD 82moIM (Fluarix, Fluzone & Alfiuria Quad PF)   [employer] Language interpreter used during this encounter. ##[id number] [place] Patient was given the opportunity to ask questions.  Patient verbalized understanding of the plan and was able to repeat key elements of the plan.   Orders Placed This Encounter  Procedures   Flu Vaccine QUAD 657moM (Fluarix, Fluzone & Alfiuria Quad PF)   TSH+T4F+T3Free   CBC   Comprehensive metabolic panel   Ambulatory referral to Psychiatry   POCT glycosylated hemoglobin (Hb A1C)     Requested Prescriptions   Signed Prescriptions Disp Refills   sertraline (ZOLOFT) 25 MG tablet 30 tablet 1    Sig: Take 1 tablet (25 mg total) by mouth daily.   hydrOXYzine (VISTARIL) 25 MG capsule 30 capsule 1    Sig: Take 1 capsule (25 mg total) by mouth at bedtime as needed for anxiety.    Return in about 7 weeks (around 01/09/2022) for f/u anxiety.  DeKarle PlumberMD, FACP

## 2021-11-21 NOTE — Patient Instructions (Signed)
Trastorno de ansiedad generalizada, en adultos Generalized Anxiety Disorder, Adult El trastorno de ansiedad generalizada (TAG) es una afeccin de salud mental. A diferencia de las preocupaciones normales, la ansiedad relacionada con el TAG no se desencadena por un acontecimiento especfico. Estas preocupaciones no desaparecen ni mejoran con el tiempo. EL TAG interfiere con las relaciones, el trabajo y [place]. Los sntomas del TAG pueden variar de leves a graves. Las personas con TAG grave pueden tener intensas oleadas de ansiedad con sntomas fsicos que son similares a las crisis de [place]. Cules son las causas? Se desconoce la causa exacta del TAG, pero se cree que influyen los siguientes factores: Diferencias en las sustancias qumicas naturales del cerebro. Genes que se transmiten de padres a hijos. Diferencias en la forma en que se perciben las amenazas. Desarrollo y Psychologist, forensic durante la infancia. La personalidad. Qu incrementa el riesgo? Los siguientes factores pueden hacer que sea ms propenso a Armed forces training and education officer afeccin: Ser mujer. Tener antecedentes familiares de trastornos de ansiedad. Ser muy tmido. Experimentar acontecimientos muy estresantes en la vida, como la muerte de un ser querido. Tener un Industrial/product designer. Cules son los signos o sntomas? Con frecuencia, las personas que padecen el TAG se preocupan excesivamente por muchas cosas en la vida, tales como su salud y [place]. Otros sntomas son: Heritage manager y emocionales: Preocupacin excesiva acerca de los desastres naturales. [place] a llegar tarde. Dificultad para concentrarse. Temor de que otras personas juzguen su desempeo. Sntomas fsicos: Fatiga. Dolores de cabeza, tensin muscular, contracciones musculares, temblores o sensacin de tambalearse. Sensacin de que el corazn late [place] rpido. Sentir falta de aire o como que no se puede respirar profundamente. Problemas para  conciliar el sueo o para seguir durmiendo, o sensacin de Air cabin crew. Sudoracin. Nuseas, diarrea o sndrome de colon irritable (SCI). Sntomas de la conducta: Tener estados de nimo cambiantes o irritabilidad. Evitar situaciones nuevas. Evitar a Public affairs consultant. Dificultad extrema para tomar decisiones. Cmo se diagnostica? Esta afeccin se diagnostica en funcin de los sntomas y los antecedentes mdicos. Adems, se le Chartered certified accountant examen fsico. El mdico puede hacerle algunas pruebas para descartar que sus sntomas tengan otras causas. Para recibir un diagnstico del TAG, una persona debe tener ansiedad que: Est fuera de su control. Afecte distintos aspectos de su vida, como el trabajo y [employer]. Cause angustia que le impida participar en sus actividades habituales. Incluya al menos tres sntomas del TAG, tales como desasosiego, fatiga, dificultad para concentrarse, irritabilidad, tensin muscular o problemas para dormir. Antes de que su mdico pueda confirmar el diagnstico de TAG, estos sntomas deben estar presentes ms [employer] no lo estn y deben tener una duracin de seis meses o ms. Cmo se trata? El tratamiento de esta afeccin puede incluir: Medicamentos. Por lo general, los medicamentos antidepresivos se recetan para un control diario a Barrister's clerk. Se pueden agregar medicamentos para la ansiedad en casos graves, especialmente cuando ocurren crisis de [place]. Psicoterapia (psicoanlisis). Determinados tipos de psicoterapia pueden ser tiles para tratar el TAG al brindar 65, educacin y orientacin. Bone Gap opciones se incluyen las siguientes: Terapia cognitivo conductual (TCC). Las personas aprenden habilidades para afrontar situaciones y tcnicas para poder calmarse para aliviar sus sntomas fsicos. Aprenden a identificar comportamientos y pensamientos no realistas y a reemplazarlos por comportamientos y pensamientos ms adecuados. Terapia de aceptacin y  compromiso (acceptance and commitment therapy, ACT). Este tratamiento ensea a las personas a ser conscientes como una forma de lidiar con pensamientos y  sentimientos no deseados. Biorretroalimentacin. Este proceso lo capacita para Aeronautical engineer respuesta del cuerpo Insurance risk surveyor) a travs de tcnicas de respiracin y mtodos de relajacin. Usted trabajar con un terapeuta mientras se usan mquinas para controlar sus sntomas fsicos. Tcnicas para Scientific laboratory technician. Estas incluyen yoga, meditacin y ejercicio. Un especialista en salud mental puede ayudar a determinar qu tratamiento es el mejor para usted. Algunas Water quality scientist con solo un tipo de [place]. Sin embargo, Producer, television/film/video requieren una combinacin de terapias. Siga estas instrucciones en su casa: Estilo de vida Mantenga horarios y [place] rutina uniforme. [place] situaciones estresantes. Michelene Gardener un plan y reserve tiempo adicional para trabajar con su plan. Practique tcnicas de control del estrs o tcnicas para calmarse que su terapeuta o su mdico le haya enseado. Haga actividad fsica con frecuencia y pase tiempo al [employer]. Siga una dieta saludable que incluya abundantes frutas, verduras, cereales integrales, productos lcteos descremados y protenas magras. No consuma muchos alimentos con alto contenido de grasas, azcar agregado o sal (sodio). Beba abundante agua. Evite tomar alcohol. El alcohol puede aumentar la ansiedad. Evite el consumo de cafena y ciertos medicamentos de venta libre contra el resfro. Estos podran Engineer, building services. Pregntele al farmacutico qu medicamentos no debera tomar. Instrucciones generales Use los medicamentos de venta libre y los recetados solamente como se lo haya indicado el mdico. Comprenda que es probable que tenga retrocesos. Acepte esto y sea amable con usted mismo mientras contina cuidndose mejor. [place] situaciones estresantes. Michelene Gardener un plan y reserve  tiempo adicional para trabajar con su plan. Reconozca y acepte sus logros, aunque le parezcan pequeos. Pase tiempo con personas que se preocupan por usted. Concurra a [place]. Esto es importante. Dnde obtener ms informacin [place] ([place]): https://carter.com/ Substance Abuse and [place] ([place] por [place] y [place]): ktimeonline.com Comunquese con un mdico si: Los sntomas no mejoran. Los sntomas empeoran. Tiene signos de depresin tales como: Un estado de nimo constantemente triste o irritable. Ya no disfruta de Engineering geologist. Cambios en el peso o en sus hbitos de alimentacin. Cambios en los hbitos de sueo. Solicite ayuda de inmediato si: Tiene pensamientos acerca de lastimarse a usted mismo o a Producer, television/film/video. Si alguna vez siente que puede lastimarse o Market researcher a Producer, television/film/video, o tiene pensamientos de poner fin a su vida, busque ayuda de inmediato. Dirjase al servicio de urgencias ms cercano o: Comunquese con el servicio de emergencias de su localidad (911 en los [place]). Llame a una lnea de asistencia al suicida y atencin en crisis como National Suicide Prevention Lifeline ([place]) al 774-219-3120. Est disponible las 24 horas del da en los [place] Enve un mensaje de texto a la lnea para casos de crisis al [phone] (en los [place].). Resumen El trastorno de ansiedad generalizada (TAG) es una afeccin de salud mental que implica preocupacin no desencadenada por un acontecimiento especfico. Con frecuencia, las personas que padecen el TAG se preocupan excesivamente por muchas cosas en la vida, tales como su salud y [place]. El TAG puede causar sntomas tales como agitacin, dificultad para concentrarse, problemas para dormir, sudoracin frecuente, nuseas, diarrea, dolores de [place] y  temblores o contracciones musculares. Un especialista en salud mental puede ayudar a determinar qu tratamiento es el mejor para usted. Algunas Water quality scientist con solo un tipo de [place].  Sin embargo, Producer, television/film/video requieren una combinacin de terapias. Esta informacin no tiene Marine scientist el consejo del mdico. Asegrese de hacerle al mdico cualquier pregunta que tenga. Document Revised: 01/31/2021 Document Reviewed: 01/31/2021 Elsevier Patient Education  2022 [employer].

## 2021-11-22 ENCOUNTER — Other Ambulatory Visit: Payer: Self-pay

## 2021-11-22 ENCOUNTER — Telehealth: Payer: Self-pay

## 2021-11-22 LAB — CBC
Hematocrit: 40.3 % (ref 34.0–46.6)
Hemoglobin: 13.1 g/dL (ref 11.1–15.9)
MCH: 27.7 pg (ref 26.6–33.0)
MCHC: 32.5 g/dL (ref 31.5–35.7)
MCV: 85 fL (ref 79–97)
Platelets: 337 10*3/uL (ref 150–450)
RBC: 4.73 x10E6/uL (ref 3.77–5.28)
RDW: 13.1 % (ref 11.7–15.4)
WBC: 10.5 10*3/uL (ref 3.4–10.8)

## 2021-11-22 LAB — COMPREHENSIVE METABOLIC PANEL
ALT: 25 IU/L (ref 0–32)
AST: 23 IU/L (ref 0–40)
Albumin/Globulin Ratio: 1.9 (ref 1.2–2.2)
Albumin: 4.8 g/dL (ref 3.8–4.8)
Alkaline Phosphatase: 92 IU/L (ref 44–121)
BUN/Creatinine Ratio: 16 (ref 9–23)
BUN: 13 mg/dL (ref 6–24)
Bilirubin Total: 0.3 mg/dL (ref 0.0–1.2)
CO2: 19 mmol/L — ABNORMAL LOW (ref 20–29)
Calcium: 9.7 mg/dL (ref 8.7–10.2)
Chloride: 100 mmol/L (ref 96–106)
Creatinine, Ser: 0.81 mg/dL (ref 0.57–1.00)
Globulin, Total: 2.5 g/dL (ref 1.5–4.5)
Glucose: 98 mg/dL (ref 70–99)
Potassium: 4 mmol/L (ref 3.5–5.2)
Sodium: 140 mmol/L (ref 134–144)
Total Protein: 7.3 g/dL (ref 6.0–8.5)
eGFR: 89 mL/min/{1.73_m2} (ref >59)

## 2021-11-22 LAB — TSH+T4F+T3FREE
Free T4: 1.25 ng/dL (ref 0.82–1.77)
T3, Free: 2.9 pg/mL (ref 2.0–4.4)
TSH: 1.91 u[IU]/mL (ref 0.450–4.500)

## 2021-11-22 NOTE — Telephone Encounter (Signed)
[employer] interpre[employer] Avanell Shackleton  Id# [phone]  contacted pt to go over lab results pt is aware and doesn't have any questions or concerns

## 2021-11-25 ENCOUNTER — Other Ambulatory Visit: Payer: Self-pay

## 2021-11-27 ENCOUNTER — Ambulatory Visit: Payer: Self-pay | Attending: Internal Medicine

## 2021-12-26 ENCOUNTER — Other Ambulatory Visit: Payer: Self-pay

## 2021-12-27 ENCOUNTER — Other Ambulatory Visit: Payer: Self-pay

## 2021-12-27 ENCOUNTER — Telehealth: Payer: Self-pay | Admitting: Clinical

## 2021-12-27 NOTE — Telephone Encounter (Signed)
I attempted to call pt,no answer, left vm via interpreter. ?

## 2021-12-31 ENCOUNTER — Other Ambulatory Visit: Payer: Self-pay

## 2022-01-21 ENCOUNTER — Ambulatory Visit: Payer: Self-pay

## 2022-01-27 ENCOUNTER — Other Ambulatory Visit: Payer: Self-pay

## 2022-01-27 ENCOUNTER — Other Ambulatory Visit: Payer: Self-pay | Admitting: Internal Medicine

## 2022-01-27 DIAGNOSIS — F411 Generalized anxiety disorder: Secondary | ICD-10-CM

## 2022-01-28 ENCOUNTER — Other Ambulatory Visit: Payer: Self-pay

## 2022-01-28 MED ORDER — SERTRALINE HCL 25 MG PO TABS
25.0000 mg | ORAL_TABLET | Freq: Every day | ORAL | 1 refills | Status: DC
  Filled 2022-01-28: qty 30, 30d supply, fill #0

## 2022-01-28 NOTE — Telephone Encounter (Signed)
Requested Prescriptions  ?Pending Prescriptions Disp Refills  ?? sertraline (ZOLOFT) 25 MG tablet 30 tablet 1  ?  Sig: Take 1 tablet (25 mg total) by mouth daily.  ?  ? Not Delegated - Psychiatry:  Antidepressants - SSRI - sertraline Failed - 01/27/2022  8:16 AM  ?  ?  Failed - This refill cannot be delegated  ?  ?  Passed - AST in normal range and within 360 days  ?  AST  ?Date Value Ref Range Status  ?11/21/2021 23 0 - 40 IU/L Final  ?   ?  ?  Passed - ALT in normal range and within 360 days  ?  ALT  ?Date Value Ref Range Status  ?11/21/2021 25 0 - 32 IU/L Final  ?   ?  ?  Passed - Completed PHQ-2 or PHQ-9 in the last 360 days  ?  ?  Passed - Valid encounter within last 6 months  ?  Recent Outpatient Visits   ?      ? 2 months ago Type 2 diabetes mellitus with obesity ([place])  ? [place] Karle Plumber B, MD  ? 8 months ago Type 2 diabetes mellitus with obesity ([place])  ? [place]ona Karle Plumber B, MD  ? 11 months ago Elevated blood-pressure reading without diagnosis of hypertension  ? [place], RPH-CPP  ? 1 year ago Type 2 diabetes mellitus with obesity ([place])  ? [place] Ladell Pier, MD  ? 1 year ago Type 2 diabetes mellitus without complication, without long-term current use of insulin ([place])  ? [place], [place], NP  ?  ?  ?Future Appointments   ?        ? In 3 days Ladell Pier, MD [place]  ?  ? ?  ?  ?  ? ?

## 2022-01-30 NOTE — Telephone Encounter (Signed)
I contacted pt via interpreter [phone]. I provided pt with [facility] walk-in hours. Pt stated that she will go during these hours to establish for therapy.  ?

## 2022-01-31 ENCOUNTER — Other Ambulatory Visit: Payer: Self-pay

## 2022-01-31 ENCOUNTER — Ambulatory Visit: Payer: Self-pay | Attending: Internal Medicine | Admitting: Internal Medicine

## 2022-01-31 VITALS — BP 110/78 | HR 79 | Ht 62.0 in | Wt 178.0 lb

## 2022-01-31 DIAGNOSIS — E1169 Type 2 diabetes mellitus with other specified complication: Secondary | ICD-10-CM

## 2022-01-31 DIAGNOSIS — F411 Generalized anxiety disorder: Secondary | ICD-10-CM

## 2022-01-31 DIAGNOSIS — E785 Hyperlipidemia, unspecified: Secondary | ICD-10-CM

## 2022-01-31 DIAGNOSIS — R0989 Other specified symptoms and signs involving the circulatory and respiratory systems: Secondary | ICD-10-CM

## 2022-01-31 DIAGNOSIS — R09A2 Foreign body sensation, throat: Secondary | ICD-10-CM

## 2022-01-31 MED ORDER — SERTRALINE HCL 50 MG PO TABS
50.0000 mg | ORAL_TABLET | Freq: Every day | ORAL | 3 refills | Status: DC
  Filled 2022-01-31: qty 30, 30d supply, fill #0
  Filled 2022-03-18: qty 30, 30d supply, fill #1
  Filled 2022-04-17: qty 60, 60d supply, fill #2

## 2022-01-31 MED ORDER — ATORVASTATIN CALCIUM 10 MG PO TABS
10.0000 mg | ORAL_TABLET | Freq: Every day | ORAL | 3 refills | Status: DC
  Filled 2022-01-31: qty 30, 30d supply, fill #0

## 2022-01-31 NOTE — Progress Notes (Signed)
? ? ?Patient ID: Theresa Barry, female    DOB: 05/08/1976  MRN: [id number] ? ?CC: Anxiety ? ? ?Subjective: ?Theresa Barry is a 46 y.o. female who presents for anxiety.  Lily from [facility] is with her and interprets.  ?Her concerns today include:  ?Pt with hx of DM,  HL, H.pylori, polyarthralgia  ? ?Anxiety: on last visit 2 mths ago, pt was started on Zoloft 25 mg and Hydroxyzine 25 mg QHS PRN.   ?She stopped the Hydroxyzine because it caused her to feel too hung over in the mornings. ?She feels that the Zoloft is helping.  Not crying as much and does not get angry as much but still feels a little nervous ?I referred her to [facility] also on last visit.  They tried to reach x 3 without success.  She spoke with our LCSW yesterday and given information for [facility].  She plans to do a walk in ?-GAD-7 score and PQH9 score was still elevated but much improved compared to last visit.  She scored a yes for suicidal thoughts on her PHQ screening today but she tells me that she is not suicidal and does not have thoughts of hurting herself. ? ?C/o feeling like something in throat  since yesterday. ?No sore throat.   ?No problems swallowing food ? ?On last visit, we rechecked her liver function test.  They were normal.  Plan was to have her stop the atorvastatin.  However patient states that she had not picked up the prescription.  She did not know that it was there at the pharmacy. ?Patient Active Problem List  ? Diagnosis Date Noted  ? GAD (generalized anxiety disorder) 11/21/2021  ? Positive depression screening 11/21/2021  ? Hyperlipidemia associated with type 2 diabetes mellitus ([place]) 06/05/2021  ? Abnormal LFTs 06/05/2021  ? Breast pain, left 03/31/2019  ? Screening breast examination 12/18/2017  ? Chronic radicular pain of lower back 05/19/2017  ? Prolapsing fibroid of cervix 05/16/2013  ? LIPOMA 08/16/2007  ?  ? ?Current Outpatient Medications on File Prior to Visit  ?Medication Sig Dispense Refill  ? Blood  Glucose Monitoring Suppl (TRUE METRIX METER) w/Device KIT Use as directed 1 kit 0  ? cyclobenzaprine (FLEXERIL) 5 MG tablet Take 1 tablet (5 mg total) by mouth daily as needed for muscle spasms. 30 tablet 1  ? glucose blood (TRUE METRIX BLOOD GLUCOSE TEST) test strip Use as instructed 100 each 12  ? hydrOXYzine (VISTARIL) 25 MG capsule Take 1 capsule (25 mg total) by mouth at bedtime as needed for anxiety. 30 capsule 1  ? ibuprofen (ADVIL) 800 MG tablet Take 1 tablet (800 mg total) by mouth every 8 (eight) hours as needed. 60 tablet 1  ? metFORMIN (GLUCOPHAGE) 500 MG tablet TAKE 1 TABLET (500 MG TOTAL) BY MOUTH 2 (TWO) TIMES DAILY WITH A MEAL. 180 tablet 1  ? Multiple Vitamin (MULTIVITAMIN) capsule Take 1 capsule by mouth daily.    ? TRUEplus Lancets 28G MISC Use as directed 100 each 4  ? ?No current facility-administered medications on file prior to visit.  ? ? ?No Known Allergies ? ?Social History  ? ?Socioeconomic History  ? Marital status: Married  ?  Spouse name: Not on file  ? Number of children: 3  ? Years of education: Not on file  ? Highest education level: High school graduate  ?Occupational History  ? Not on file  ?Tobacco Use  ? Smoking status: Never  ? Smokeless tobacco: Never  ?Vaping Use  ?  Vaping Use: Never used  ?Substance and Sexual Activity  ? Alcohol use: No  ? Drug use: No  ? Sexual activity: Not Currently  ?  Birth control/protection: Surgical  ?Other Topics Concern  ? Not on file  ?Social History Narrative  ? Not on file  ? ?Social Determinants of Health  ? ?Financial Resource Strain: Not on file  ?Food Insecurity: Not on file  ?Transportation Needs: Not on file  ?Physical Activity: Not on file  ?Stress: Not on file  ?Social Connections: Not on file  ?Intimate Partner Violence: Not on file  ? ? ?Family History  ?Problem Relation Age of Onset  ? Diabetes Mother   ? Cancer Mother   ? Ovarian cancer Mother   ? Diabetes Brother   ? Cancer Brother   ? Breast cancer Paternal Aunt   ? Breast cancer  Maternal Aunt   ? ? ?Past Surgical History:  ?Procedure Laterality Date  ? ABDOMINAL HYSTERECTOMY    ? BILATERAL SALPINGECTOMY Bilateral 12/18/2015  ? Procedure: BILATERAL SALPINGECTOMY;  Surgeon: Woodroe Mode, MD;  Location: [facility];  Service: Gynecology;  Laterality: Bilateral;  ? fibroidectomy    ? vaginally  ? HYSTEROSCOPY WITH D & C N/A 05/16/2013  ? Procedure: DILATATION AND CURETTAGE /HYSTEROSCOPY  Removal of [name];  Surgeon: Osborne Oman, MD;  Location: [facility];  Service: Gynecology;  Laterality: N/A;  ? OVARIAN CYST REMOVAL    ? VAGINAL HYSTERECTOMY N/A 12/18/2015  ? Procedure: HYSTERECTOMY VAGINAL;  Surgeon: Woodroe Mode, MD;  Location: [facility];  Service: Gynecology;  Laterality: N/A;  ? ? ?ROS: ?Review of Systems ?Negative except as stated above ? ?PHYSICAL EXAM: ?BP 110/78   Pulse 79   Ht _0  (1.575 m)   Wt 178 lb (80.7 kg)   LMP  (LMP Unknown)   SpO2 98%   BMI 32.56 kg/m?   ?Physical Exam ? ?General appearance - alert, well appearing, and in no distress ?Mental status - normal mood, behavior, speech, dress, motor activity, and thought processes ?Mouth - mucous membranes moist, pharynx normal without lesions ?Neck - supple, no significant adenopathy ? ? ?  01/31/2022  ?  4:06 PM 11/21/2021  ?  3:14 PM 05/24/2021  ?  4:28 PM 08/17/2020  ?  9:12 AM  ?GAD 7 : Generalized Anxiety Score  ?Nervous, Anxious, on Edge _1 ?Control/stop worrying _2 ?Worry too much - different things _3 ?Trouble relaxing _4 ?Restless _5 ?Easily annoyed or irritable _6 ?Afraid - awful might happen _7 ?Total GAD 7 Score _8 ? ? ?  01/31/2022  ?  4:06 PM 11/21/2021  ?  3:14 PM 05/24/2021  ?  4:28 PM  ?Depression screen PHQ 2/9  ?Decreased Interest _9 ?Down, Depressed, Hopeless _10 ?PHQ - 2 Score _11 ?Altered sleeping _12 ?Tired, decreased energy _13 ?Change in appetite _14 ?Feeling bad or failure about yourself  _15 ?Trouble concentrating _16 ?Moving slowly or fidgety/restless _17 ?Suicidal thoughts _18 ?PHQ-9 Score _19 ? ? ? ? ?  Latest Ref Rng & Units 11/21/2021  ?  4:38 PM 06/03/2021  ? 10:12 AM 08/17/2020  ?  9:47 AM  ?CMP  ?Glucose 70 - 99 mg/dL 98    98    ?BUN 6 - 24 mg/dL 13    12    ?Creatinine 0.57 - 1.00 mg/dL 0.81    0.75    ?Sodium 134 - 144 mmol/L 140    139    ?Potassium 3.5 - 5.2 mmol/L 4.0    4.1    ?Chloride 96 - 106 mmol/L 100    105    ?CO2 20 - 29 mmol/L 19    20    ?Calcium 8.7 - 10.2 mg/dL 9.7    9.2    ?Total Protein 6.0 - 8.5 g/dL 7.3   7.1   7.2    ?Total Bilirubin 0.0 - 1.2 mg/dL 0.3   0.6   0.9    ?Alkaline Phos 44 - 121 IU/L 92   96   86    ?AST 0 - 40 IU/L 23   41   21    ?ALT 0 - 32 IU/L 25   46   23    ? ?Lipid Panel  ?   ?Component Value Date/Time  ? CHOL 203 (H) 06/03/2021 1012  ? TRIG 251 (H) 06/03/2021 1012  ? HDL 48 06/03/2021 1012  ? CHOLHDL 4.2 06/03/2021 1012  ? CHOLHDL 3.4 Ratio 12/01/2007 2012  ? VLDL 32 12/01/2007 2012  ? LDLCALC 112 (H) 06/03/2021 1012  ? ? ?CBC ?   ?Component Value Date/Time  ? WBC 10.5 11/21/2021 1638  ? WBC 15.2 (H) 12/19/2015 0553  ? RBC 4.73 11/21/2021 1638  ? RBC 4.03 12/19/2015 0553  ? HGB 13.1 11/21/2021 1638  ? HCT 40.3 11/21/2021 1638  ? PLT 337 11/21/2021 1638  ? MCV 85 11/21/2021 1638  ? MCH 27.7 11/21/2021 1638  ? MCH 25.8 (L) 12/19/2015 0553  ? MCHC 32.5 11/21/2021 1638  ? MCHC 33.4 12/19/2015 0553  ? RDW 13.1 11/21/2021 1638  ? LYMPHSABS 2.1 06/24/2018 1522  ? MONOABS 0.8 08/12/2015 1850  ? EOSABS 0.1 06/24/2018 1522  ? BASOSABS 0.0 06/24/2018 1522  ? ? ?ASSESSMENT AND PLAN: ?1. GAD (generalized anxiety disorder) ?Improved but not fully controlled.  I recommend increasing the Zoloft to 50 mg daily.  Patient was agreeable to this.  She inquired how long she would have to stay on the Zoloft.  I told her that usually we titrate the medicine until the anxiety is controlled or in remission.  At that point we then usually continue the medication for about 6 months before  trying to wean off. ? ?- sertraline (ZOLOFT) 50 MG tablet; Take 1 tablet (50 mg total) by mouth daily.  Dispense: 30 tablet; Refill: 3 ? ?2. Globus sensation ?Exam unrevealing.  This started yesterday.  I recommend garg

## 2022-02-04 ENCOUNTER — Other Ambulatory Visit: Payer: Self-pay

## 2022-02-28 ENCOUNTER — Other Ambulatory Visit: Payer: Self-pay | Admitting: Internal Medicine

## 2022-02-28 ENCOUNTER — Other Ambulatory Visit: Payer: Self-pay

## 2022-02-28 DIAGNOSIS — E669 Obesity, unspecified: Secondary | ICD-10-CM

## 2022-02-28 MED ORDER — METFORMIN HCL 500 MG PO TABS
ORAL_TABLET | Freq: Two times a day (BID) | ORAL | 3 refills | Status: DC
  Filled 2022-02-28: qty 60, 30d supply, fill #0
  Filled 2022-04-17: qty 180, 90d supply, fill #1

## 2022-03-04 ENCOUNTER — Other Ambulatory Visit: Payer: Self-pay

## 2022-03-18 ENCOUNTER — Other Ambulatory Visit: Payer: Self-pay

## 2022-03-20 ENCOUNTER — Ambulatory Visit
Admission: RE | Admit: 2022-03-20 | Discharge: 2022-03-20 | Disposition: A | Discharge status: Home / Self Care | Payer: No Typology Code available for payment source | Source: Ambulatory Visit | Attending: Obstetrics and Gynecology | Admitting: Obstetrics and Gynecology

## 2022-03-20 ENCOUNTER — Ambulatory Visit: Payer: Self-pay | Admitting: *Deleted

## 2022-03-20 VITALS — BP 104/80 | Wt 176.2 lb

## 2022-03-20 DIAGNOSIS — Z1231 Encounter for screening mammogram for malignant neoplasm of breast: Secondary | ICD-10-CM

## 2022-03-20 DIAGNOSIS — Z1239 Encounter for other screening for malignant neoplasm of breast: Secondary | ICD-10-CM

## 2022-03-20 DIAGNOSIS — Z1211 Encounter for screening for malignant neoplasm of colon: Secondary | ICD-10-CM

## 2022-03-20 NOTE — Patient Instructions (Addendum)
Explained breast self awareness with Theresa Barry. Patient did not need a Pap smear today due to patient has a history of a hysterectomy for benign reasons. Let patient know that she doesn't need any further Pap smears due to her history of a hysterectomy for benign reasons. Referred patient to the [facility] for a screening mammogram on mobile unit. Appointment scheduled Thursday, March 20, 2022 at 1530. Patient aware of appointment and will be there. Let patient know the [facility] will follow up with her within the next couple weeks with results of her mammogram by letter or phone. Theresa Barry verbalized understanding.  Chiquita Heckert, Arvil Chaco, RN 3:06 PM

## 2022-03-20 NOTE — Progress Notes (Signed)
Theresa Barry is a 46 y.o. female who presents to [facility] clinic today with no complaints.    Pap Smear: Pap smear not completed today. Last Pap smear was 04/04/2011 at [facility] and normal. Per patient has no history of an abnormal Pap smear. Patient has history of a hysterectomy 12/18/2015 due to fibroids, AUB, and anemia. Patient no longer needs Pap smears due to her history of a hysterectomy for benign reasons per BCCCP and ASCCP guidelines. Last Pap smear result is available in EPIC.    Physical exam: Breasts Breasts symmetrical. No skin abnormalities bilateral breasts. No nipple retraction bilateral breasts. No nipple discharge bilateral breasts. No lymphadenopathy. No lumps palpated bilateral breasts. No complaints of pain or tenderness on exam.      MS DIGITAL DIAG TOMO UNI RIGHT  Result Date: 08/07/2020 CLINICAL DATA:  Patient returns today to evaluate a possible RIGHT breast asymmetry questioned on recent screening mammogram. EXAM: DIGITAL DIAGNOSTIC UNILATERAL RIGHT MAMMOGRAM WITH TOMO AND CAD COMPARISON:  Previous exams including recent screening mammogram dated 07/17/2020. ACR Breast Density Category c: The breast tissue is heterogeneously dense, which may obscure small masses. FINDINGS: On today's additional diagnostic views with spot compression and 3D tomosynthesis, there is no persistent asymmetry within the RIGHT breast indicating superimposition of normal dense fibroglandular tissues. Mammographic images were processed with CAD. IMPRESSION: No evidence of malignancy. Patient may return to routine annual bilateral screening mammogram schedule. RECOMMENDATION: Screening mammogram in one year.(Code:SM-B-01Y) I have discussed the findings and recommendations with the patient with the aid of an interpreter. If applicable, a reminder letter will be sent to the patient regarding the next appointment. BI-RADS CATEGORY  1: Negative. Electronically Signed   By:  Franki Cabot M.D.   On: 08/07/2020 13:34   MS DIGITAL SCREENING TOMO BILATERAL  Result Date: 07/20/2020 CLINICAL DATA:  Screening. EXAM: DIGITAL SCREENING BILATERAL MAMMOGRAM WITH TOMO AND CAD COMPARISON:  Previous exam(s). ACR Breast Density Category c: The breast tissue is heterogeneously dense, which may obscure small masses. FINDINGS: In the right breast, a possible asymmetry warrants further evaluation. In the left breast, no findings suspicious for malignancy. Images were processed with CAD. IMPRESSION: Further evaluation is suggested for possible asymmetry in the right breast. RECOMMENDATION: Diagnostic mammogram and possibly ultrasound of the right breast. (Code:[id number]) The patient will be contacted regarding the findings, and additional imaging will be scheduled. BI-RADS CATEGORY  0: Incomplete. Need additional imaging evaluation and/or prior mammograms for comparison. Electronically Signed   By: Lillia Mountain M.D.   On: 07/20/2020 08:24   MS DIGITAL DIAG TOMO BILAT  Result Date: 03/31/2019 CLINICAL DATA:  Palpable lump within the LEFT axilla. EXAM: DIGITAL DIAGNOSTIC BILATERAL MAMMOGRAM WITH CAD AND TOMO ULTRASOUND LEFT BREAST COMPARISON:  Previous exam(s). ACR Breast Density Category c: The breast tissue is heterogeneously dense, which may obscure small masses. FINDINGS: There is a small oval low-density mass within the superficial soft tissues of the LEFT axilla, measuring approximately 5 mm greatest dimension, corresponding to the area of clinical concern with overlying skin marker in place. There are no new dominant masses, suspicious calcifications or secondary signs of malignancy within either breast. Mammographic images were processed with CAD. Targeted ultrasound is performed, showing a mildly complex sebaceous cyst within the skin of the LEFT axilla, measuring 8 x 2 mm, with associated microcalcifications, corresponding to the area of clinical concern and mammographic findings. No solid  or cystic mass is seen within the underlying soft tissues. No enlarged or  morphologically abnormal lymph nodes in the LEFT axilla. IMPRESSION: 1. Benign sebaceous cyst within the skin of the LEFT axilla, measuring 8 mm, corresponding to the area of clinical concern. 2. No evidence of malignancy within either breast. RECOMMENDATION: 1. Screening mammogram in one year.(Code:SM-B-01Y) 2. Patient was reassured that sebaceous cysts are benign and typically self-limiting. Patient was encouraged to follow-up with referring physician, and follow-up imaging, if any associated skin redness, warmth or increasing pain developed at the site. I have discussed the findings and recommendations with the patient. Results were also provided in writing at the conclusion of the visit. If applicable, a reminder letter will be sent to the patient regarding the next appointment. BI-RADS CATEGORY  2: Benign. Electronically Signed   By: Franki Cabot M.D.   On: 03/31/2019 14:12   MM DIAG BREAST TOMO BILATERAL  Result Date: 01/26/2018 CLINICAL DATA:  Patient presents complaining of a focal area of pain along the right breast axillary tail region to axilla for the last 3 weeks. EXAM: DIGITAL DIAGNOSTIC BILATERAL MAMMOGRAM WITH CAD AND TOMO ULTRASOUND RIGHT BREAST COMPARISON:  Prior exams. ACR Breast Density Category c: The breast tissue is heterogeneously dense, which may obscure small masses. FINDINGS: There are no discrete masses, areas of architectural distortion, areas of significant asymmetry or suspicious calcifications. Mammographic images were processed with CAD. On physical exam, there is a mildly raised, erythematous lesion along the inferior aspect of the right axilla bordering the far upper outer right breast. Patient is tender to palpation over this lesion. Targeted ultrasound is performed, showing an oval hypo colic circumscribed mass within the skin of the right inferior axilla with surrounding inflammation and hyperemia.  This lesion measures 18 x 3 x 16 mm. It is consistent with an inflamed/infected sebaceous or dermal inclusion cyst. There are no enlarged or abnormal right axillary lymph nodes. IMPRESSION: 1. No evidence of breast malignancy. 2. Benign right inferior axilla skin lesion consistent with an infected/inflamed sebaceous or dermal inclusion cyst. RECOMMENDATION: 1.  Screening mammogram in one year.(Code:SM-B-01Y) 2. Patient will be given a course of antibiotics, specifically Augmentin 875 mg 1 p.o. b.i.d., for 1 week. If symptoms do not improve, or if they worsen, patient was instructed to follow-up with her primary care clinician. I have discussed the findings and recommendations with the patient. Results were also provided in writing at the conclusion of the visit. If applicable, a reminder letter will be sent to the patient regarding the next appointment. BI-RADS CATEGORY  1: Negative. Electronically Signed   By: Lajean Manes M.D.   On: 01/26/2018 16:17     Pelvic/Bimanual Pap is not indicated today per BCCCP guidelines.   Smoking History: Patient has never smoked.   Patient Navigation: Patient education provided. Access to services provided for patient through [place] program. Spanish interpreter [name] from [facility] provided.   Colorectal Cancer Screening: Per patient has never had colonoscopy completed. Patient completed a FIT Test 06/03/2021 that was negative. Patient given a FIT Test to complete in August 2023. No complaints today.    Breast and Cervical Cancer Risk Assessment: Patient has a family history of a maternal aunt and two paternal aunts having breast cancer. Patient has no known genetic mutations or history of radiation treatment to the chest before age 64. Patient has no history of cervical dysplasia, immunocompromised, or DES exposure in-utero.  Risk Assessment     Risk Scores       03/20/2022 07/17/2020   Last edited by: Royston Bake, CMA  McGill, Sherie P, LPN    5-year risk: 0.8 % 0.7 %   Lifetime risk: 7.9 % 8.1 %            A: BCCCP exam without pap smear No complaints.  P: Referred patient to the [facility] for a screening mammogram on mobile unit. Appointment scheduled Thursday, March 20, 2022 at 1530.  Loletta Parish, RN 03/20/2022 3:05 PM

## 2022-04-18 ENCOUNTER — Other Ambulatory Visit: Payer: Self-pay

## 2022-06-02 ENCOUNTER — Ambulatory Visit: Payer: Self-pay | Attending: Internal Medicine | Admitting: Internal Medicine

## 2022-06-02 ENCOUNTER — Encounter: Payer: Self-pay | Admitting: Internal Medicine

## 2022-06-02 VITALS — BP 110/72 | HR 74 | Wt 165.2 lb

## 2022-06-02 DIAGNOSIS — Z6830 Body mass index (BMI) 30.0-30.9, adult: Secondary | ICD-10-CM

## 2022-06-02 DIAGNOSIS — E1169 Type 2 diabetes mellitus with other specified complication: Secondary | ICD-10-CM

## 2022-06-02 DIAGNOSIS — Z23 Encounter for immunization: Secondary | ICD-10-CM

## 2022-06-02 DIAGNOSIS — E669 Obesity, unspecified: Secondary | ICD-10-CM

## 2022-06-02 DIAGNOSIS — E785 Hyperlipidemia, unspecified: Secondary | ICD-10-CM

## 2022-06-02 DIAGNOSIS — R400 Somnolence: Secondary | ICD-10-CM

## 2022-06-02 LAB — POCT GLYCOSYLATED HEMOGLOBIN (HGB A1C): HbA1c, POC (controlled diabetic range): 6.4 % (ref 0.0–7.0)

## 2022-06-02 LAB — GLUCOSE, POCT (MANUAL RESULT ENTRY): POC Glucose: 88 mg/dl (ref 70–99)

## 2022-06-02 NOTE — Progress Notes (Signed)
Patient ID: Theresa Barry, female    DOB: 1976/02/01  MRN: [id number]  CC: chronic ds management  Subjective: Theresa Barry is a 46 y.o. female who presents for chronic ds management Her concerns today include:  Pt with hx of DM,  HL, H.pylori, polyarthralgia   Very tired and sleepy at times.  Feels she lacks energy. Does not feel refresh when she wakes in the mornings.  She gets in about 7 hrs of sleep at time.  Husband tells her that she snores very loud.  Endorses morning headaches and feels sleepy during the day.  DM: Results for orders placed or performed in visit on 06/02/22  POCT glucose (manual entry)  Result Value Ref Range   POC Glucose 88 70 - 99 mg/dl  POCT glycosylated hemoglobin (Hb A1C)  Result Value Ref Range   Hemoglobin A1C     HbA1c POC (<> result, manual entry)     HbA1c, POC (prediabetic range)     HbA1c, POC (controlled diabetic range) 6.4 0.0 - 7.0 %  Reports compliance with Metformin Not checking BS. Doing well with eating habits.  Walk 3-4x/wk.   Lost 13 lbs in past 4 mths.   HL:  taking and tolerating Lipitor.  Admits she forgets to take it about 2 days  Patient Active Problem List   Diagnosis Date Noted   GAD (generalized anxiety disorder) 11/21/2021   Positive depression screening 11/21/2021   Hyperlipidemia associated with type 2 diabetes mellitus ([place]) 06/05/2021   Abnormal LFTs 06/05/2021   Breast pain, left 03/31/2019   Screening breast examination 12/18/2017   Chronic radicular pain of lower back 05/19/2017   Prolapsing fibroid of cervix 05/16/2013   LIPOMA 08/16/2007     Current Outpatient Medications on File Prior to Visit  Medication Sig Dispense Refill   atorvastatin (LIPITOR) 10 MG tablet Take 1 tablet (10 mg total) by mouth daily. 30 tablet 3   Blood Glucose Monitoring Suppl (TRUE METRIX METER) w/Device KIT Use as directed 1 kit 0   cyclobenzaprine (FLEXERIL) 5 MG tablet Take 1 tablet (5 mg total) by mouth  daily as needed for muscle spasms. 30 tablet 1   glucose blood (TRUE METRIX BLOOD GLUCOSE TEST) test strip Use as instructed 100 each 12   hydrOXYzine (VISTARIL) 25 MG capsule Take 1 capsule (25 mg total) by mouth at bedtime as needed for anxiety. 30 capsule 1   ibuprofen (ADVIL) 800 MG tablet Take 1 tablet (800 mg total) by mouth every 8 (eight) hours as needed. 60 tablet 1   metFORMIN (GLUCOPHAGE) 500 MG tablet TAKE 1 TABLET (500 MG TOTAL) BY MOUTH 2 (TWO) TIMES DAILY WITH A MEAL. 60 tablet 3   Multiple Vitamin (MULTIVITAMIN) capsule Take 1 capsule by mouth daily.     sertraline (ZOLOFT) 50 MG tablet Take 1 tablet (50 mg total) by mouth daily. 30 tablet 3   TRUEplus Lancets 28G MISC Use as directed 100 each 4   No current facility-administered medications on file prior to visit.    No Known Allergies  Social History   Socioeconomic History   Marital status: Married    Spouse name: Not on file   Number of children: 3   Years of education: Not on file   Highest education level: High school graduate  Occupational History   Not on file  Tobacco Use   Smoking status: Never   Smokeless tobacco: Never  Vaping Use   Vaping Use: Never used  Substance and Sexual  Activity   Alcohol use: No   Drug use: No   Sexual activity: Yes    Birth control/protection: Surgical  Other Topics Concern   Not on file  Social History Narrative   Not on file   Social Determinants of Health   Financial Resource Strain: Not on file  Food Insecurity: No Food Insecurity (03/20/2022)   Hunger Vital Sign    Worried About Running Out of Food in the Last Year: Never true    Ran Out of Food in the Last Year: Never true  Transportation Needs: No Transportation Needs (03/20/2022)   PRAPARE - Hydrologist (Medical): No    Lack of Transportation (Non-Medical): No  Physical Activity: Not on file  Stress: Not on file  Social Connections: Not on file  Intimate Partner Violence: Not  on file    Family History  Problem Relation Age of Onset   Diabetes Mother    Cancer Mother    Ovarian cancer Mother    Breast cancer Maternal Aunt    Breast cancer Paternal Aunt    Breast cancer Paternal Aunt    Diabetes Brother    Cancer Brother     Past Surgical History:  Procedure Laterality Date   ABDOMINAL HYSTERECTOMY     BILATERAL SALPINGECTOMY Bilateral 12/18/2015   Procedure: BILATERAL SALPINGECTOMY;  Surgeon: Woodroe Mode, MD;  Location: [facility];  Service: Gynecology;  Laterality: Bilateral;   fibroidectomy     vaginally   HYSTEROSCOPY WITH D & C N/A 05/16/2013   Procedure: DILATATION AND CURETTAGE /HYSTEROSCOPY  Removal of [name];  Surgeon: Osborne Oman, MD;  Location: [facility];  Service: Gynecology;  Laterality: N/A;   OVARIAN CYST REMOVAL     VAGINAL HYSTERECTOMY N/A 12/18/2015   Procedure: HYSTERECTOMY VAGINAL;  Surgeon: Woodroe Mode, MD;  Location: [facility];  Service: Gynecology;  Laterality: N/A;    ROS: Review of Systems Negative except as stated above  PHYSICAL EXAM: BP 110/72   Pulse 74   Wt 165 lb 3.2 oz (74.9 kg)   LMP  (LMP Unknown)   SpO2 99%   BMI 30.22 kg/m   Wt Readings from Last 3 Encounters:  06/02/22 165 lb 3.2 oz (74.9 kg)  03/20/22 176 lb 3.2 oz (79.9 kg)  01/31/22 178 lb (80.7 kg)    Physical Exam  General appearance - alert, well appearing, and in no distress Mental status - alert, oriented to person, place, and time Neck - supple, no significant adenopathy Chest - clear to auscultation, no wheezes, rales or rhonchi, symmetric air entry Heart - normal rate, regular rhythm, normal S1, S2, no murmurs, rubs, clicks or gallops Extremities - peripheral pulses normal, no pedal edema, no clubbing or cyanosis      Latest Ref Rng & Units 11/21/2021    4:38 PM 06/03/2021   10:12 AM 08/17/2020    9:47 AM  CMP  Glucose 70 - 99 mg/dL 98   98   BUN 6 - 24 mg/dL 13   12   Creatinine 0.57 - 1.00 mg/dL 0.81   0.75   Sodium  134 - 144 mmol/L 140   139   Potassium 3.5 - 5.2 mmol/L 4.0   4.1   Chloride 96 - 106 mmol/L 100   105   CO2 20 - 29 mmol/L 19   20   Calcium 8.7 - 10.2 mg/dL 9.7   9.2   Total Protein 6.0 - 8.5 g/dL 7.3  7.1  7.2   Total Bilirubin 0.0 - 1.2 mg/dL 0.3  0.6  0.9   Alkaline Phos 44 - 121 IU/L 92  96  86   AST 0 - 40 IU/L 23  41  21   ALT 0 - 32 IU/L 25  46  23    Lipid Panel     Component Value Date/Time   CHOL 203 (H) 06/03/2021 1012   TRIG 251 (H) 06/03/2021 1012   HDL 48 06/03/2021 1012   CHOLHDL 4.2 06/03/2021 1012   CHOLHDL 3.4 Ratio 12/01/2007 2012   VLDL 32 12/01/2007 2012   LDLCALC 112 (H) 06/03/2021 1012    CBC    Component Value Date/Time   WBC 10.5 11/21/2021 1638   WBC 15.2 (H) 12/19/2015 0553   RBC 4.73 11/21/2021 1638   RBC 4.03 12/19/2015 0553   HGB 13.1 11/21/2021 1638   HCT 40.3 11/21/2021 1638   PLT 337 11/21/2021 1638   MCV 85 11/21/2021 1638   MCH 27.7 11/21/2021 1638   MCH 25.8 (L) 12/19/2015 0553   MCHC 32.5 11/21/2021 1638   MCHC 33.4 12/19/2015 0553   RDW 13.1 11/21/2021 1638   LYMPHSABS 2.1 06/24/2018 1522   MONOABS 0.8 08/12/2015 1850   EOSABS 0.1 06/24/2018 1522   BASOSABS 0.0 06/24/2018 1522    ASSESSMENT AND PLAN: 1. Type 2 diabetes mellitus with obesity (HCC) At goal.  Continue metformin, healthy eating habits and regular exercise.  Commended her on weight loss so far.  Encouraged her to keep up the good works. - POCT glucose (manual entry) - POCT glycosylated hemoglobin (Hb A1C) - Microalbumin / creatinine urine ratio  2. Hyperlipidemia associated with type 2 diabetes mellitus (HCC) Continue atorvastatin.  Encouraged her to get a pillbox to help remind herself to take her medications.  3. Daytime sleepiness Symptoms suggest sleep apnea.  Advised her to apply for the orange card/cone discount card.  Once approved we will refer her for sleep study.  4. Need for influenza vaccination Given today    [employer] Language interpreter used  during this encounter. #[id number], [place].  [id number], [place]  Patient was given the opportunity to ask questions.  Patient verbalized understanding of the plan and was able to repeat key elements of the plan.   This documentation was completed using Radio producer.  Any transcriptional errors are unintentional.  Orders Placed This Encounter  Procedures   Microalbumin / creatinine urine ratio   POCT glucose (manual entry)   POCT glycosylated hemoglobin (Hb A1C)     Requested Prescriptions    No prescriptions requested or ordered in this encounter    Return in about 4 months (around 10/02/2022).  Karle Plumber, MD, FACP

## 2022-06-03 LAB — MICROALBUMIN / CREATININE URINE RATIO
Creatinine, Urine: 16.1 mg/dL
Microalb/Creat Ratio: <19 mg/g creat (ref 0–29)
Microalbumin, Urine: <3 ug/mL

## 2022-06-24 ENCOUNTER — Other Ambulatory Visit: Payer: Self-pay

## 2022-06-24 ENCOUNTER — Other Ambulatory Visit: Payer: Self-pay | Admitting: Internal Medicine

## 2022-06-24 DIAGNOSIS — F411 Generalized anxiety disorder: Secondary | ICD-10-CM

## 2022-06-24 MED ORDER — SERTRALINE HCL 50 MG PO TABS
50.0000 mg | ORAL_TABLET | Freq: Every day | ORAL | 1 refills | Status: DC
  Filled 2022-06-24: qty 90, 90d supply, fill #0
  Filled 2022-09-24: qty 90, 90d supply, fill #1

## 2022-06-24 NOTE — Telephone Encounter (Signed)
Requested Prescriptions  Pending Prescriptions Disp Refills  . sertraline (ZOLOFT) 50 MG tablet 90 tablet 1    Sig: Take 1 tablet (50 mg total) by mouth daily.     Psychiatry:  Antidepressants - SSRI - sertraline Passed - 06/24/2022  8:28 AM      Passed - AST in normal range and within 360 days    AST  Date Value Ref Range Status  11/21/2021 23 0 - 40 IU/L Final         Passed - ALT in normal range and within 360 days    ALT  Date Value Ref Range Status  11/21/2021 25 0 - 32 IU/L Final         Passed - Completed PHQ-2 or PHQ-9 in the last 360 days      Passed - Valid encounter within last 6 months    Recent Outpatient Visits          3 weeks ago Type 2 diabetes mellitus with obesity ([place])   [place] Ladell [place], MD   4 months ago GAD (generalized anxiety disorder)   [place], Deborah B, MD   7 months ago Type 2 diabetes mellitus with obesity [facility])   [place], Deborah B, MD   1 year ago Type 2 diabetes mellitus with obesity [facility])   [place], MD   1 year ago Elevated blood-pressure reading without diagnosis of hypertension   [place], RPH-CPP      Future Appointments            In 3 months Wynetta Emery, Dalbert Batman, MD [place]

## 2022-06-25 ENCOUNTER — Other Ambulatory Visit: Payer: Self-pay

## 2022-08-04 ENCOUNTER — Other Ambulatory Visit: Payer: Self-pay

## 2022-08-04 ENCOUNTER — Other Ambulatory Visit: Payer: Self-pay | Admitting: Internal Medicine

## 2022-08-04 DIAGNOSIS — E669 Obesity, unspecified: Secondary | ICD-10-CM

## 2022-08-05 ENCOUNTER — Other Ambulatory Visit: Payer: Self-pay

## 2022-08-05 MED ORDER — METFORMIN HCL 500 MG PO TABS
ORAL_TABLET | Freq: Two times a day (BID) | ORAL | 1 refills | Status: DC
  Filled 2022-08-05: qty 180, 90d supply, fill #0
  Filled 2022-11-17 (×2): qty 180, 90d supply, fill #1

## 2022-08-05 NOTE — Telephone Encounter (Signed)
Requested Prescriptions  Pending Prescriptions Disp Refills  . metFORMIN (GLUCOPHAGE) 500 MG tablet 180 tablet 1    Sig: TAKE 1 TABLET (500 MG TOTAL) BY MOUTH 2 (TWO) TIMES DAILY WITH A MEAL.     Endocrinology:  Diabetes - Biguanides Failed - 08/04/2022  8:30 AM      Failed - B12 Level in normal range and within 720 days    No results found for: "VITAMINB12"       Failed - CBC within normal limits and completed in the last 12 months    WBC  Date Value Ref Range Status  11/21/2021 10.5 3.4 - 10.8 x10E3/uL Final  12/19/2015 15.2 (H) 4.0 - 10.5 K/uL Final    Comment:    WHITE COUNT CONFIRMED ON SMEAR   RBC  Date Value Ref Range Status  11/21/2021 4.73 3.77 - 5.28 x10E6/uL Final  12/19/2015 4.03 3.87 - 5.11 MIL/uL Final   Hemoglobin  Date Value Ref Range Status  11/21/2021 13.1 11.1 - 15.9 g/dL Final   Hematocrit  Date Value Ref Range Status  11/21/2021 40.3 34.0 - 46.6 % Final   MCHC  Date Value Ref Range Status  11/21/2021 32.5 31.5 - 35.7 g/dL Final  12/19/2015 33.4 30.0 - 36.0 g/dL Final   [facility]  Date Value Ref Range Status  11/21/2021 27.7 26.6 - 33.0 pg Final  12/19/2015 25.8 (L) 26.0 - 34.0 pg Final   MCV  Date Value Ref Range Status  11/21/2021 85 79 - 97 fL Final   No results found for: "PLTCOUNTKUC", "LABPLAT", "POCPLA" RDW  Date Value Ref Range Status  11/21/2021 13.1 11.7 - 15.4 % Final         Passed - Cr in normal range and within 360 days    Creatinine, Ser  Date Value Ref Range Status  11/21/2021 0.81 0.57 - 1.00 mg/dL Final   Creatinine,U  Date Value Ref Range Status  08/01/2008 131.5 mg/dL Final    Comment:    See lab report for associated comment(s)         Passed - HBA1C is between 0 and 7.9 and within 180 days    HbA1c, POC (controlled diabetic range)  Date Value Ref Range Status  06/02/2022 6.4 0.0 - 7.0 % Final         Passed - eGFR in normal range and within 360 days    GFR calc Af Amer  Date Value Ref Range Status  08/17/2020  111 >59 mL/min/1.73 Final    Comment:    **In accordance with recommendations from the NKF-ASN Task force,**   [facility] is in the process of updating its eGFR calculation to the   2021 CKD-EPI creatinine equation that estimates kidney function   without a race variable.    GFR calc non Af Amer  Date Value Ref Range Status  08/17/2020 96 >59 mL/min/1.73 Final   eGFR  Date Value Ref Range Status  11/21/2021 89 >59 mL/min/1.73 Final         Passed - Valid encounter within last 6 months    Recent Outpatient Visits          2 months ago Type 2 diabetes mellitus with obesity [facility])   [place], MD   6 months ago GAD (generalized anxiety disorder)   [place] Karle Plumber B, MD   8 months ago Type 2 diabetes mellitus with obesity [facility])   [place], MD   1 year ago Type 2 diabetes mellitus with obesity [facility])   [place], MD   1 year ago Elevated blood-pressure reading without diagnosis of hypertension   [place], RPH-CPP      Future Appointments            In 2 months Wynetta Emery, Dalbert Batman, MD [place]

## 2022-09-24 ENCOUNTER — Other Ambulatory Visit: Payer: Self-pay

## 2022-10-10 ENCOUNTER — Encounter: Payer: Self-pay | Admitting: Internal Medicine

## 2022-10-10 ENCOUNTER — Ambulatory Visit: Payer: Self-pay | Attending: Internal Medicine | Admitting: Internal Medicine

## 2022-10-10 ENCOUNTER — Other Ambulatory Visit: Payer: Self-pay

## 2022-10-10 VITALS — BP 111/77 | HR 67 | Temp 98.0°F | Ht 62.0 in | Wt 178.0 lb

## 2022-10-10 DIAGNOSIS — E785 Hyperlipidemia, unspecified: Secondary | ICD-10-CM

## 2022-10-10 DIAGNOSIS — F411 Generalized anxiety disorder: Secondary | ICD-10-CM

## 2022-10-10 DIAGNOSIS — E1169 Type 2 diabetes mellitus with other specified complication: Secondary | ICD-10-CM

## 2022-10-10 DIAGNOSIS — E669 Obesity, unspecified: Secondary | ICD-10-CM

## 2022-10-10 LAB — POCT GLYCOSYLATED HEMOGLOBIN (HGB A1C): HbA1c, POC (controlled diabetic range): 6.2 % (ref 0.0–7.0)

## 2022-10-10 LAB — GLUCOSE, POCT (MANUAL RESULT ENTRY): POC Glucose: 85 mg/dl (ref 70–99)

## 2022-10-10 MED ORDER — SERTRALINE HCL 50 MG PO TABS
50.0000 mg | ORAL_TABLET | Freq: Every day | ORAL | 1 refills | Status: DC
  Filled 2022-10-10 – 2022-12-18 (×2): qty 90, 90d supply, fill #0
  Filled 2023-03-27: qty 90, 90d supply, fill #1

## 2022-10-10 MED ORDER — ROSUVASTATIN CALCIUM 5 MG PO TABS
5.0000 mg | ORAL_TABLET | Freq: Every day | ORAL | 3 refills | Status: DC
  Filled 2022-10-10: qty 30, 30d supply, fill #0
  Filled 2022-11-17 (×2): qty 30, 30d supply, fill #1
  Filled 2022-12-18: qty 30, 30d supply, fill #2
  Filled 2023-01-21: qty 30, 30d supply, fill #3

## 2022-10-10 NOTE — Progress Notes (Signed)
Patient ID: Theresa Barry, female    DOB: 10-Aug-1976  MRN: [id number]  CC: Diabetes (Dm f/u. Inconsistently taking atorvastatin/Discuss anxiety medication. /)   Subjective: Theresa Barry is a 47 y.o. female who presents for chronic ds management Her concerns today include:  Pt with hx of DM,  HL, H.pylori, polyarthralgia    DM: Results for orders placed or performed in visit on 10/10/22  POCT glucose (manual entry)  Result Value Ref Range   POC Glucose 85 70 - 99 mg/dl  POCT glycosylated hemoglobin (Hb A1C)  Result Value Ref Range   Hemoglobin A1C     HbA1c POC (<> result, manual entry)     HbA1c, POC (prediabetic range)     HbA1c, POC (controlled diabetic range) 6.2 0.0 - 7.0 %  Reports compliance with Metformin 500 mg BID Doing good with eating habits Does Zumba class 2x/wk Does not check BS Over due for eye exam, no insurance Takes Lipitor but not daily, about 2 x/wk.  Reports nausea with the med. Agrees to med change  Anxiety:  doing well on Zoloft and afraid that anxiety may flare up if she stops taking it.   Wants to know how long she has to be on med Patient Active Problem List   Diagnosis Date Noted   GAD (generalized anxiety disorder) 11/21/2021   Positive depression screening 11/21/2021   Hyperlipidemia associated with type 2 diabetes mellitus ([place]) 06/05/2021   Abnormal LFTs 06/05/2021   Breast pain, left 03/31/2019   Screening breast examination 12/18/2017   Chronic radicular pain of lower back 05/19/2017   Prolapsing fibroid of cervix 05/16/2013   LIPOMA 08/16/2007     Current Outpatient Medications on File Prior to Visit  Medication Sig Dispense Refill   ibuprofen (ADVIL) 800 MG tablet Take 1 tablet (800 mg total) by mouth every 8 (eight) hours as needed. 60 tablet 1   metFORMIN (GLUCOPHAGE) 500 MG tablet TAKE 1 TABLET (500 MG TOTAL) BY MOUTH 2 (TWO) TIMES DAILY WITH A MEAL. 180 tablet 1   Multiple Vitamin (MULTIVITAMIN) capsule Take  1 capsule by mouth daily.     Blood Glucose Monitoring Suppl (TRUE METRIX METER) w/Device KIT Use as directed (Patient not taking: Reported on 10/10/2022) 1 kit 0   cyclobenzaprine (FLEXERIL) 5 MG tablet Take 1 tablet (5 mg total) by mouth daily as needed for muscle spasms. (Patient not taking: Reported on 10/10/2022) 30 tablet 1   glucose blood (TRUE METRIX BLOOD GLUCOSE TEST) test strip Use as instructed (Patient not taking: Reported on 10/10/2022) 100 each 12   hydrOXYzine (VISTARIL) 25 MG capsule Take 1 capsule (25 mg total) by mouth at bedtime as needed for anxiety. (Patient not taking: Reported on 10/10/2022) 30 capsule 1   TRUEplus Lancets 28G MISC Use as directed (Patient not taking: Reported on 10/10/2022) 100 each 4   No current facility-administered medications on file prior to visit.    Allergies  Allergen Reactions   Lipitor [Atorvastatin] Other (See Comments)    nausea    Social History   Socioeconomic History   Marital status: Married    Spouse name: Not on file   Number of children: 3   Years of education: Not on file   Highest education level: High school graduate  Occupational History   Not on file  Tobacco Use   Smoking status: Never   Smokeless tobacco: Never  Vaping Use   Vaping Use: Never used  Substance and Sexual Activity   Alcohol  use: No   Drug use: No   Sexual activity: Yes    Birth control/protection: Surgical  Other Topics Concern   Not on file  Social History Narrative   Not on file   Social Determinants of Health   Financial Resource Strain: Not on file  Food Insecurity: No Food Insecurity (03/20/2022)   Hunger Vital Sign    Worried About Running Out of Food in the Last Year: Never true    Ran Out of Food in the Last Year: Never true  Transportation Needs: No Transportation Needs (03/20/2022)   PRAPARE - Hydrologist (Medical): No    Lack of Transportation (Non-Medical): No  Physical Activity: Not on file  Stress: Not  on file  Social Connections: Not on file  Intimate Partner Violence: Not on file    Family History  Problem Relation Age of Onset   Diabetes Mother    Cancer Mother    Ovarian cancer Mother    Breast cancer Maternal Aunt    Breast cancer Paternal Aunt    Breast cancer Paternal Aunt    Diabetes Brother    Cancer Brother     Past Surgical History:  Procedure Laterality Date   ABDOMINAL HYSTERECTOMY     BILATERAL SALPINGECTOMY Bilateral 12/18/2015   Procedure: BILATERAL SALPINGECTOMY;  Surgeon: Woodroe Mode, MD;  Location: [facility];  Service: Gynecology;  Laterality: Bilateral;   fibroidectomy     vaginally   HYSTEROSCOPY WITH D & C N/A 05/16/2013   Procedure: DILATATION AND CURETTAGE /HYSTEROSCOPY  Removal of [name];  Surgeon: Osborne Oman, MD;  Location: [facility];  Service: Gynecology;  Laterality: N/A;   OVARIAN CYST REMOVAL     VAGINAL HYSTERECTOMY N/A 12/18/2015   Procedure: HYSTERECTOMY VAGINAL;  Surgeon: Woodroe Mode, MD;  Location: [facility];  Service: Gynecology;  Laterality: N/A;    ROS: Review of Systems Negative except as stated above  PHYSICAL EXAM: BP 111/77 (BP Location: Left Arm, Patient Position: Sitting, Cuff Size: Normal)   Pulse 67   Temp 98 F (36.7 C) (Oral)   Ht '5\' 2"'$  (1.575 m)   Wt 178 lb (80.7 kg)   LMP  (LMP Unknown)   SpO2 99%   BMI 32.56 kg/m   Wt Readings from Last 3 Encounters:  10/10/22 178 lb (80.7 kg)  06/02/22 165 lb 3.2 oz (74.9 kg)  03/20/22 176 lb 3.2 oz (79.9 kg)    Physical Exam  General appearance - alert, well appearing, middle-age obese middle-age Hispanic female in no distress Mental status - normal mood, behavior, speech, dress, motor activity, and thought processes Chest - clear to auscultation, no wheezes, rales or rhonchi, symmetric air entry Heart - normal rate, regular rhythm, normal S1, S2, no murmurs, rubs, clicks or gallops Extremities - peripheral pulses normal, no pedal edema, no clubbing or  cyanosis      Latest Ref Rng & Units 11/21/2021    4:38 PM 06/03/2021   10:12 AM 08/17/2020    9:47 AM  CMP  Glucose 70 - 99 mg/dL 98   98   BUN 6 - 24 mg/dL 13   12   Creatinine 0.57 - 1.00 mg/dL 0.81   0.75   Sodium 134 - 144 mmol/L 140   139   Potassium 3.5 - 5.2 mmol/L 4.0   4.1   Chloride 96 - 106 mmol/L 100   105   CO2 20 - 29 mmol/L 19   20  Calcium 8.7 - 10.2 mg/dL 9.7   9.2   Total Protein 6.0 - 8.5 g/dL 7.3  7.1  7.2   Total Bilirubin 0.0 - 1.2 mg/dL 0.3  0.6  0.9   Alkaline Phos 44 - 121 IU/L 92  96  86   AST 0 - 40 IU/L 23  41  21   ALT 0 - 32 IU/L 25  46  23    Lipid Panel     Component Value Date/Time   CHOL 203 (H) 06/03/2021 1012   TRIG 251 (H) 06/03/2021 1012   HDL 48 06/03/2021 1012   CHOLHDL 4.2 06/03/2021 1012   CHOLHDL 3.4 Ratio 12/01/2007 2012   VLDL 32 12/01/2007 2012   LDLCALC 112 (H) 06/03/2021 1012    CBC    Component Value Date/Time   WBC 10.5 11/21/2021 1638   WBC 15.2 (H) 12/19/2015 0553   RBC 4.73 11/21/2021 1638   RBC 4.03 12/19/2015 0553   HGB 13.1 11/21/2021 1638   HCT 40.3 11/21/2021 1638   PLT 337 11/21/2021 1638   MCV 85 11/21/2021 1638   MCH 27.7 11/21/2021 1638   MCH 25.8 (L) 12/19/2015 0553   MCHC 32.5 11/21/2021 1638   MCHC 33.4 12/19/2015 0553   RDW 13.1 11/21/2021 1638   LYMPHSABS 2.1 06/24/2018 1522   MONOABS 0.8 08/12/2015 1850   EOSABS 0.1 06/24/2018 1522   BASOSABS 0.0 06/24/2018 1522    ASSESSMENT AND PLAN: 1. Type 2 diabetes mellitus with obesity (HCC) Control.  Continue metformin, health eating habits and regular exercise.  Encouraged her to get eye exam when she is able to afford. - POCT glucose (manual entry) - POCT glycosylated hemoglobin (Hb A1C) - CBC - Comprehensive metabolic panel - Lipid panel  2. Hyperlipidemia associated with type 2 diabetes mellitus (HCC) We will change the atorvastatin to Crestor since the atorvastatin has been causing nausea.  3. GAD (generalized anxiety  disorder) Patient informed that usually after anxiety has been well-controlled on an SSRI for 6 to 12 months, we can give a trial of weaning off the medication.  However if she is doing okay on the low-dose that she is on and is afraid to stop it at this time, we can continue it for now. - sertraline (ZOLOFT) 50 MG tablet; Take 1 tablet (50 mg total) by mouth daily.  Dispense: 90 tablet; Refill: 1    [employer] Language interpreter used during this encounter. #Jackie, [id number]  Patient was given the opportunity to ask questions.  Patient verbalized understanding of the plan and was able to repeat key elements of the plan.   This documentation was completed using Radio producer.  Any transcriptional errors are unintentional.  Orders Placed This Encounter  Procedures   CBC   Comprehensive metabolic panel   Lipid panel   POCT glucose (manual entry)   POCT glycosylated hemoglobin (Hb A1C)     Requested Prescriptions   Signed Prescriptions Disp Refills   rosuvastatin (CRESTOR) 5 MG tablet 30 tablet 3    Sig: Take 1 tablet (5 mg total) by mouth daily. Stop Atorvastatin   sertraline (ZOLOFT) 50 MG tablet 90 tablet 1    Sig: Take 1 tablet (50 mg total) by mouth daily.    Return in about 4 months (around 02/08/2023).  Karle Plumber, MD, FACP

## 2022-10-11 LAB — CBC
Hematocrit: 39.1 % (ref 34.0–46.6)
Hemoglobin: 12.6 g/dL (ref 11.1–15.9)
MCH: 27.4 pg (ref 26.6–33.0)
MCHC: 32.2 g/dL (ref 31.5–35.7)
MCV: 85 fL (ref 79–97)
Platelets: 322 10*3/uL (ref 150–450)
RBC: 4.6 x10E6/uL (ref 3.77–5.28)
RDW: 13.3 % (ref 11.7–15.4)
WBC: 6.6 10*3/uL (ref 3.4–10.8)

## 2022-10-11 LAB — COMPREHENSIVE METABOLIC PANEL
ALT: 23 IU/L (ref 0–32)
AST: 21 IU/L (ref 0–40)
Albumin/Globulin Ratio: 1.5 (ref 1.2–2.2)
Albumin: 4.3 g/dL (ref 3.9–4.9)
Alkaline Phosphatase: 108 IU/L (ref 44–121)
BUN/Creatinine Ratio: 16 (ref 9–23)
BUN: 9 mg/dL (ref 6–24)
Bilirubin Total: 0.3 mg/dL (ref 0.0–1.2)
CO2: 23 mmol/L (ref 20–29)
Calcium: 8.9 mg/dL (ref 8.7–10.2)
Chloride: 104 mmol/L (ref 96–106)
Creatinine, Ser: 0.57 mg/dL (ref 0.57–1.00)
Globulin, Total: 2.8 g/dL (ref 1.5–4.5)
Glucose: 83 mg/dL (ref 70–99)
Potassium: 4.1 mmol/L (ref 3.5–5.2)
Sodium: 139 mmol/L (ref 134–144)
Total Protein: 7.1 g/dL (ref 6.0–8.5)
eGFR: 112 mL/min/{1.73_m2} (ref >59)

## 2022-10-11 LAB — LIPID PANEL
Chol/HDL Ratio: 5.3 ratio — ABNORMAL HIGH (ref 0.0–4.4)
Cholesterol, Total: 208 mg/dL — ABNORMAL HIGH (ref 100–199)
HDL: 39 mg/dL — ABNORMAL LOW (ref >39)
LDL Chol Calc (NIH): 110 mg/dL — ABNORMAL HIGH (ref 0–99)
Triglycerides: 341 mg/dL — ABNORMAL HIGH (ref 0–149)
VLDL Cholesterol Cal: 59 mg/dL — ABNORMAL HIGH (ref 5–40)

## 2022-10-11 NOTE — Progress Notes (Signed)
Let patient know that her cholesterol levels are elevated.  High cholesterol increases her risk for heart attack and strokes.  On recent visit, we changed the cholesterol medication to 1 called rosuvastatin.  Please make sure that she picks it up from the pharmacy if she has not done so already. -Blood cell counts are normal. Kidney and liver function tests are normal.

## 2022-10-13 ENCOUNTER — Other Ambulatory Visit: Payer: Self-pay

## 2022-10-15 ENCOUNTER — Other Ambulatory Visit: Payer: Self-pay

## 2022-11-17 ENCOUNTER — Other Ambulatory Visit: Payer: Self-pay

## 2022-12-18 ENCOUNTER — Other Ambulatory Visit: Payer: Self-pay

## 2022-12-19 ENCOUNTER — Other Ambulatory Visit: Payer: Self-pay

## 2023-01-21 ENCOUNTER — Other Ambulatory Visit: Payer: Self-pay

## 2023-02-09 ENCOUNTER — Ambulatory Visit: Payer: Self-pay | Attending: Internal Medicine | Admitting: Internal Medicine

## 2023-02-09 ENCOUNTER — Encounter: Payer: Self-pay | Admitting: Internal Medicine

## 2023-02-09 VITALS — BP 115/79 | HR 86 | Temp 98.0°F | Ht 62.0 in | Wt 178.0 lb

## 2023-02-09 DIAGNOSIS — F411 Generalized anxiety disorder: Secondary | ICD-10-CM

## 2023-02-09 DIAGNOSIS — E1169 Type 2 diabetes mellitus with other specified complication: Secondary | ICD-10-CM

## 2023-02-09 DIAGNOSIS — Z7984 Long term (current) use of oral hypoglycemic drugs: Secondary | ICD-10-CM

## 2023-02-09 DIAGNOSIS — Z6832 Body mass index (BMI) 32.0-32.9, adult: Secondary | ICD-10-CM

## 2023-02-09 DIAGNOSIS — F321 Major depressive disorder, single episode, moderate: Secondary | ICD-10-CM

## 2023-02-09 DIAGNOSIS — E669 Obesity, unspecified: Secondary | ICD-10-CM

## 2023-02-09 DIAGNOSIS — Z1211 Encounter for screening for malignant neoplasm of colon: Secondary | ICD-10-CM

## 2023-02-09 DIAGNOSIS — E785 Hyperlipidemia, unspecified: Secondary | ICD-10-CM

## 2023-02-09 LAB — POCT GLYCOSYLATED HEMOGLOBIN (HGB A1C): HbA1c, POC (controlled diabetic range): 6.4 % (ref 0.0–7.0)

## 2023-02-09 LAB — GLUCOSE, POCT (MANUAL RESULT ENTRY): POC Glucose: 103 mg/dl — AB (ref 70–99)

## 2023-02-09 NOTE — Progress Notes (Signed)
Patient ID: Theresa Barry, female    DOB: 04-Jan-1976  MRN: [id number]  CC: Diabetes (DM f/u. /Pt does not have meter & supplies at home to measure BS. )   Subjective: Theresa Barry is a 47 y.o. female who presents for chronic ds management Her concerns today include:  Pt with hx of DM,  HL, H.pylori, polyarthralgia, anxiety  [employer] Language interpreter used during this encounter. #Theresa Barry   DM: Results for orders placed or performed in visit on 02/09/23  POCT glucose (manual entry)  Result Value Ref Range   POC Glucose 103 (A) 70 - 99 mg/dl  POCT glycosylated hemoglobin (Hb A1C)  Result Value Ref Range   Hemoglobin A1C     HbA1c POC (<> result, manual entry)     HbA1c, POC (prediabetic range)     HbA1c, POC (controlled diabetic range) 6.4 0.0 - 7.0 %  Patient reports compliance with metformin 500 mg twice a day. Testing supplies prescribed in past but never picked up.  She does not really want to stick herself Doing okay with eating habits - "I do try to eat healthy Still does Zumba 2 x/wk Taking and tolerating Crestor for cholesterol.  This was started on last visit. Lelon Huh PHQ9 and GAD7 scores today.  Pt reports "I do try to be well but some days I feel anxious and down."  Also having some issues with her teenage son and this makes her sad as well.  Does feel the Zoloft helps.  Had recent death in the family (a close cousin) and had stopped taking the Zoloft for a few days.  Denies any SI even though she answered positive on PHQ9 screen  HM:  due for eye exam but no insurance.  Due for colon CA screen.  Patient Active Problem List   Diagnosis Date Noted   GAD (generalized anxiety disorder) 11/21/2021   Positive depression screening 11/21/2021   Hyperlipidemia associated with type 2 diabetes mellitus (HCC) 06/05/2021   Abnormal LFTs 06/05/2021   Breast pain, left 03/31/2019   Screening breast examination 12/18/2017   Chronic radicular pain of  lower back 05/19/2017   Prolapsing fibroid of cervix 05/16/2013   LIPOMA 08/16/2007     Current Outpatient Medications on File Prior to Visit  Medication Sig Dispense Refill   ibuprofen (ADVIL) 800 MG tablet Take 1 tablet (800 mg total) by mouth every 8 (eight) hours as needed. 60 tablet 1   metFORMIN (GLUCOPHAGE) 500 MG tablet TAKE 1 TABLET (500 MG TOTAL) BY MOUTH 2 (TWO) TIMES DAILY WITH A MEAL. 180 tablet 1   rosuvastatin (CRESTOR) 5 MG tablet Take 1 tablet (5 mg total) by mouth daily. Stop Atorvastatin 30 tablet 3   sertraline (ZOLOFT) 50 MG tablet Take 1 tablet (50 mg total) by mouth daily. 90 tablet 1   hydrOXYzine (VISTARIL) 25 MG capsule Take 1 capsule (25 mg total) by mouth at bedtime as needed for anxiety. (Patient not taking: Reported on 10/10/2022) 30 capsule 1   Multiple Vitamin (MULTIVITAMIN) capsule Take 1 capsule by mouth daily. (Patient not taking: Reported on 02/09/2023)     TRUEplus Lancets 28G MISC Use as directed (Patient not taking: Reported on 10/10/2022) 100 each 4   No current facility-administered medications on file prior to visit.    Allergies  Allergen Reactions   Lipitor [Atorvastatin] Other (See Comments)    nausea    Social History   Socioeconomic History   Marital status: Married    Spouse  name: Not on file   Number of children: 3   Years of education: Not on file   Highest education level: High school graduate  Occupational History   Not on file  Tobacco Use   Smoking status: Never   Smokeless tobacco: Never  Vaping Use   Vaping Use: Never used  Substance and Sexual Activity   Alcohol use: No   Drug use: No   Sexual activity: Yes    Birth control/protection: Surgical  Other Topics Concern   Not on file  Social History Narrative   Not on file   Social Determinants of Health   Financial Resource Strain: Not on file  Food Insecurity: No Food Insecurity (03/20/2022)   Hunger Vital Sign    Worried About Running Out of Food in the Last Year:  Never true    Ran Out of Food in the Last Year: Never true  Transportation Needs: No Transportation Needs (03/20/2022)   PRAPARE - Administrator, Civil Service (Medical): No    Lack of Transportation (Non-Medical): No  Physical Activity: Not on file  Stress: Not on file  Social Connections: Not on file  Intimate Partner Violence: Not on file    Family History  Problem Relation Age of Onset   Diabetes Mother    Cancer Mother    Ovarian cancer Mother    Breast cancer Maternal Aunt    Breast cancer Paternal Aunt    Breast cancer Paternal Aunt    Diabetes Brother    Cancer Brother     Past Surgical History:  Procedure Laterality Date   ABDOMINAL HYSTERECTOMY     BILATERAL SALPINGECTOMY Bilateral 12/18/2015   Procedure: BILATERAL SALPINGECTOMY;  Surgeon: Adam Phenix, MD;  Location: [facility];  Service: Gynecology;  Laterality: Bilateral;   fibroidectomy     vaginally   HYSTEROSCOPY WITH D & C N/A 05/16/2013   Procedure: DILATATION AND CURETTAGE /HYSTEROSCOPY  Removal of Salome Arnt FIBROID;  Surgeon: Tereso Newcomer, MD;  Location: [facility];  Service: Gynecology;  Laterality: N/A;   OVARIAN CYST REMOVAL     VAGINAL HYSTERECTOMY N/A 12/18/2015   Procedure: HYSTERECTOMY VAGINAL;  Surgeon: Adam Phenix, MD;  Location: [facility];  Service: Gynecology;  Laterality: N/A;    ROS: Review of Systems Negative except as stated above  PHYSICAL EXAM: BP 115/79 (BP Location: Left Arm, Patient Position: Sitting, Cuff Size: Normal)   Pulse 86   Temp 98 F (36.7 C) (Oral)   Ht 5\' 2"  (1.575 m)   Wt 178 lb (80.7 kg)   LMP  (LMP Unknown)   SpO2 97%   BMI 32.56 kg/m   Wt Readings from Last 3 Encounters:  02/09/23 178 lb (80.7 kg)  10/10/22 178 lb (80.7 kg)  06/02/22 165 lb 3.2 oz (74.9 kg)    Physical Exam  General appearance - alert, well appearing, middle-aged Hispanic female and in no distress Mental status - normal mood, behavior, speech, dress, motor activity, and thought  processes Neck - supple, no significant adenopathy Chest - clear to auscultation, no wheezes, rales or rhonchi, symmetric air entry Heart - normal rate, regular rhythm, normal S1, S2, no murmurs, rubs, clicks or gallops Extremities - peripheral pulses normal, no pedal edema, no clubbing or cyanosis     02/09/2023    4:13 PM 10/10/2022    3:44 PM 01/31/2022    4:06 PM  Depression screen PHQ 2/9  Decreased Interest 2 1 2   Down, Depressed, Hopeless 2  2 1  PHQ - 2 Score 4 3 3   Altered sleeping 2 2 1   Tired, decreased energy 2 2 2   Change in appetite 2 2 2   Feeling bad or failure about yourself  2 2 3   Trouble concentrating 2 2 2   Moving slowly or fidgety/restless 2 2 2   Suicidal thoughts 2 1 2   PHQ-9 Score 18 16 17       02/09/2023    4:54 PM 10/10/2022    3:44 PM 01/31/2022    4:06 PM 11/21/2021    3:14 PM  GAD 7 : Generalized Anxiety Score  Nervous, Anxious, on Edge 2 2 3 3   Control/stop worrying 2 2 2 3   Worry too much - different things 2 2 3 3   Trouble relaxing 2 2 2 3   Restless 2 2 2 3   Easily annoyed or irritable 2 2 2 3   Afraid - awful might happen 2 2 2 3   Total GAD 7 Score 14 14 16 21         Latest Ref Rng & Units 10/10/2022    4:10 PM 11/21/2021    4:38 PM 06/03/2021   10:12 AM  CMP  Glucose 70 - 99 mg/dL 83  98    BUN 6 - 24 mg/dL 9  13    Creatinine 5.28 - 1.00 mg/dL 4.13  2.44    Sodium 010 - 144 mmol/L 139  140    Potassium 3.5 - 5.2 mmol/L 4.1  4.0    Chloride 96 - 106 mmol/L 104  100    CO2 20 - 29 mmol/L 23  19    Calcium 8.7 - 10.2 mg/dL 8.9  9.7    Total Protein 6.0 - 8.5 g/dL 7.1  7.3  7.1   Total Bilirubin 0.0 - 1.2 mg/dL 0.3  0.3  0.6   Alkaline Phos 44 - 121 IU/L 108  92  96   AST 0 - 40 IU/L 21  23  41   ALT 0 - 32 IU/L 23  25  46    Lipid Panel     Component Value Date/Time   CHOL 208 (H) 10/10/2022 1610   TRIG 341 (H) 10/10/2022 1610   HDL 39 (L) 10/10/2022 1610   CHOLHDL 5.3 (H) 10/10/2022 1610   CHOLHDL 3.4 Ratio 12/01/2007 2012   VLDL 32  12/01/2007 2012   LDLCALC 110 (H) 10/10/2022 1610    CBC    Component Value Date/Time   WBC 6.6 10/10/2022 1610   WBC 15.2 (H) 12/19/2015 0553   RBC 4.60 10/10/2022 1610   RBC 4.03 12/19/2015 0553   HGB 12.6 10/10/2022 1610   HCT 39.1 10/10/2022 1610   PLT 322 10/10/2022 1610   MCV 85 10/10/2022 1610   MCH 27.4 10/10/2022 1610   MCH 25.8 (L) 12/19/2015 0553   MCHC 32.2 10/10/2022 1610   MCHC 33.4 12/19/2015 0553   RDW 13.3 10/10/2022 1610   LYMPHSABS 2.1 06/24/2018 1522   MONOABS 0.8 08/12/2015 1850   EOSABS 0.1 06/24/2018 1522   BASOSABS 0.0 06/24/2018 1522    ASSESSMENT AND PLAN: 1. Type 2 diabetes mellitus with obesity (HCC) At goal.  Continue metformin.  Patient is not interested in checking blood sugars.  Her A1cs have been below 7 for the past year so I think we can get away with her not having to check.  Encouraged her to continue healthy eating habits and regular exercise. - POCT glucose (manual entry) - POCT glycosylated hemoglobin (Hb  A1C)  2. Hyperlipidemia associated with type 2 diabetes mellitus (HCC) She is tolerating the Crestor.  Plan to recheck lipid profile and liver function test today. - Lipid panel - Hepatic Function Panel  3. GAD (generalized anxiety disorder) Patient declines increased dose on Zoloft.  She is agreeable to being referred for some therapy. - Ambulatory referral to Psychiatry  4. Moderate major depression (HCC) See #3 above - Ambulatory referral to Psychiatry  5. Screening for colon cancer - Fecal occult blood, imunochemical([facility])  Encouraged to get a diabetic eye exam when she is able to afford.  Patient was given the opportunity to ask questions.  Patient verbalized understanding of the plan and was able to repeat key elements of the plan.   This documentation was completed using Paediatric nurse.  Any transcriptional errors are unintentional.  Orders Placed This Encounter  Procedures   Fecal  occult blood, imunochemical([facility])   Lipid panel   Hepatic Function Panel   Ambulatory referral to Psychiatry   POCT glucose (manual entry)   POCT glycosylated hemoglobin (Hb A1C)     Requested Prescriptions    No prescriptions requested or ordered in this encounter    Return in about 4 months (around 06/12/2023).  Jonah Blue, MD, FACP

## 2023-02-10 LAB — HEPATIC FUNCTION PANEL
ALT: 24 IU/L (ref 0–32)
AST: 25 IU/L (ref 0–40)
Albumin: 4.4 g/dL (ref 3.9–4.9)
Alkaline Phosphatase: 112 IU/L (ref 44–121)
Bilirubin Total: 0.3 mg/dL (ref 0.0–1.2)
Bilirubin, Direct: 0.1 mg/dL (ref 0.00–0.40)
Total Protein: 7.1 g/dL (ref 6.0–8.5)

## 2023-02-10 LAB — LIPID PANEL
Chol/HDL Ratio: 4.5 ratio — ABNORMAL HIGH (ref 0.0–4.4)
Cholesterol, Total: 168 mg/dL (ref 100–199)
HDL: 37 mg/dL — ABNORMAL LOW (ref >39)
LDL Chol Calc (NIH): 59 mg/dL (ref 0–99)
Triglycerides: 476 mg/dL — ABNORMAL HIGH (ref 0–149)
VLDL Cholesterol Cal: 72 mg/dL — ABNORMAL HIGH (ref 5–40)

## 2023-02-20 ENCOUNTER — Other Ambulatory Visit: Payer: Self-pay | Admitting: Internal Medicine

## 2023-02-20 ENCOUNTER — Other Ambulatory Visit: Payer: Self-pay

## 2023-02-20 MED ORDER — ROSUVASTATIN CALCIUM 5 MG PO TABS
5.0000 mg | ORAL_TABLET | Freq: Every day | ORAL | 1 refills | Status: DC
  Filled 2023-02-20: qty 90, 90d supply, fill #0
  Filled 2023-05-21: qty 90, 90d supply, fill #1

## 2023-02-20 NOTE — Telephone Encounter (Signed)
Requested Prescriptions  Pending Prescriptions Disp Refills   rosuvastatin (CRESTOR) 5 MG tablet 90 tablet 1    Sig: Take 1 tablet (5 mg total) by mouth daily. Stop Atorvastatin     Cardiovascular:  Antilipid - Statins 2 Failed - 02/20/2023  8:18 AM      Failed - Lipid Panel in normal range within the last 12 months    Cholesterol, Total  Date Value Ref Range Status  02/09/2023 168 100 - 199 mg/dL Final   LDL Chol Calc (NIH)  Date Value Ref Range Status  02/09/2023 59 0 - 99 mg/dL Final   HDL  Date Value Ref Range Status  02/09/2023 37 (L) >39 mg/dL Final   Triglycerides  Date Value Ref Range Status  02/09/2023 476 (H) 0 - 149 mg/dL Final         Passed - Cr in normal range and within 360 days    Creatinine, Ser  Date Value Ref Range Status  10/10/2022 0.57 0.57 - 1.00 mg/dL Final   Creatinine,U  Date Value Ref Range Status  08/01/2008 131.5 mg/dL Final    Comment:    See lab report for associated comment(s)         Passed - Patient is not pregnant      Passed - Valid encounter within last 12 months    Recent Outpatient Visits           1 week ago Type 2 diabetes mellitus with obesity (HCC)   [place] [facility] Jonah Blue B, MD   4 months ago Type 2 diabetes mellitus with obesity [facility])   [place] [facility] Jonah Blue B, MD   8 months ago Type 2 diabetes mellitus with obesity [facility])   [place] [facility] Marcine Matar, MD   1 year ago GAD (generalized anxiety disorder)   [place] [facility] Marcine Matar, MD   1 year ago Type 2 diabetes mellitus with obesity [facility])   [place] [facility] Marcine Matar, MD       Future Appointments             In 3 months Laural Benes, Binnie Rail, MD [facility]

## 2023-02-27 LAB — FECAL OCCULT BLOOD, IMMUNOCHEMICAL: Fecal Occult Bld: NEGATIVE

## 2023-03-27 ENCOUNTER — Other Ambulatory Visit: Payer: Self-pay

## 2023-05-21 ENCOUNTER — Other Ambulatory Visit: Payer: Self-pay

## 2023-05-21 ENCOUNTER — Other Ambulatory Visit: Payer: Self-pay | Admitting: Internal Medicine

## 2023-05-21 DIAGNOSIS — E669 Obesity, unspecified: Secondary | ICD-10-CM

## 2023-05-21 MED ORDER — METFORMIN HCL 500 MG PO TABS
500.0000 mg | ORAL_TABLET | Freq: Two times a day (BID) | ORAL | 0 refills | Status: DC
  Filled 2023-05-21: qty 60, 30d supply, fill #0

## 2023-05-22 ENCOUNTER — Other Ambulatory Visit: Payer: Self-pay

## 2023-06-15 ENCOUNTER — Ambulatory Visit: Payer: Self-pay | Admitting: Internal Medicine

## 2023-06-29 ENCOUNTER — Encounter (HOSPITAL_COMMUNITY): Payer: Self-pay | Admitting: Emergency Medicine

## 2023-06-29 ENCOUNTER — Other Ambulatory Visit: Payer: Self-pay

## 2023-06-29 ENCOUNTER — Other Ambulatory Visit: Payer: Self-pay | Admitting: Internal Medicine

## 2023-06-29 ENCOUNTER — Ambulatory Visit (INDEPENDENT_AMBULATORY_CARE_PROVIDER_SITE_OTHER): Payer: Self-pay

## 2023-06-29 ENCOUNTER — Ambulatory Visit (HOSPITAL_COMMUNITY)
Admission: EM | Admit: 2023-06-29 | Discharge: 2023-06-29 | Disposition: A | Discharge status: Home / Self Care | Payer: Self-pay | Attending: Internal Medicine | Admitting: Internal Medicine

## 2023-06-29 DIAGNOSIS — R0602 Shortness of breath: Secondary | ICD-10-CM

## 2023-06-29 DIAGNOSIS — F411 Generalized anxiety disorder: Secondary | ICD-10-CM

## 2023-06-29 DIAGNOSIS — E1169 Type 2 diabetes mellitus with other specified complication: Secondary | ICD-10-CM

## 2023-06-29 DIAGNOSIS — M549 Dorsalgia, unspecified: Secondary | ICD-10-CM

## 2023-06-29 MED ORDER — KETOROLAC TROMETHAMINE 30 MG/ML IJ SOLN
30.0000 mg | Freq: Once | INTRAMUSCULAR | Status: AC
  Administered 2023-06-29: 30 mg via INTRAMUSCULAR

## 2023-06-29 MED ORDER — METHOCARBAMOL 500 MG PO TABS: 500.0000 mg | ORAL_TABLET | Freq: Every evening | ORAL | 0 refills | Status: DC | PRN

## 2023-06-29 MED ORDER — IBUPROFEN 800 MG PO TABS: 800.0000 mg | ORAL_TABLET | Freq: Three times a day (TID) | ORAL | 0 refills | Status: DC | PRN

## 2023-06-29 MED ORDER — KETOROLAC TROMETHAMINE 30 MG/ML IJ SOLN
INTRAMUSCULAR | Status: AC
  Filled 2023-06-29: qty 1

## 2023-06-29 NOTE — ED Provider Notes (Signed)
[facility]    CSN: [id number] Arrival date & time: 06/29/23  1654      History   Chief Complaint Chief Complaint  Patient presents with   Back Pain    HPI Theresa Barry is a 47 y.o. female comes to the urgent care with 3-day history of left-sided neck and left upper back pain.  Patient symptoms started insidiously and has been persistent.  Pain is moderately severe currently about 8 out of 10.  Pain is aggravated by movement of the left upper extremity and taking a deep breath.  Patient denies any chest tightness.  She denies any calf pain.  No recent travel.  No history of blood clots.  No fever or chills.  No cough or sputum production.  No dizziness, near-syncope or syncopal episodes.  Patient denies palpitations.  Patient endorses muscle spasms on the left side of the neck as well as left upper back. HPI  Past Medical History:  Diagnosis Date   Anemia    Diabetes mellitus without complication (HCC)    elevated Hgb A1C   Fibroid     Patient Active Problem List   Diagnosis Date Noted   GAD (generalized anxiety disorder) 11/21/2021   Positive depression screening 11/21/2021   Hyperlipidemia associated with type 2 diabetes mellitus (HCC) 06/05/2021   Abnormal LFTs 06/05/2021   Breast pain, left 03/31/2019   Screening breast examination 12/18/2017   Chronic radicular pain of lower back 05/19/2017   Prolapsing fibroid of cervix 05/16/2013   LIPOMA 08/16/2007    Past Surgical History:  Procedure Laterality Date   ABDOMINAL HYSTERECTOMY     BILATERAL SALPINGECTOMY Bilateral 12/18/2015   Procedure: BILATERAL SALPINGECTOMY;  Surgeon: Adam Phenix, MD;  Location: [facility];  Service: Gynecology;  Laterality: Bilateral;   fibroidectomy     vaginally   HYSTEROSCOPY WITH D & C N/A 05/16/2013   Procedure: DILATATION AND CURETTAGE /HYSTEROSCOPY  Removal of Salome Arnt FIBROID;  Surgeon: Tereso Newcomer, MD;  Location: [facility];  Service: Gynecology;  Laterality:  N/A;   OVARIAN CYST REMOVAL     VAGINAL HYSTERECTOMY N/A 12/18/2015   Procedure: HYSTERECTOMY VAGINAL;  Surgeon: Adam Phenix, MD;  Location: [facility];  Service: Gynecology;  Laterality: N/A;    OB History     Gravida  5   Para  3   Term  3   Preterm      AB  1   Living  3      SAB  1   IAB      Ectopic      Multiple  1   Live Births  3            Home Medications    Prior to Admission medications   Medication Sig Start Date End Date Taking? Authorizing Provider  ibuprofen (ADVIL) 800 MG tablet Take 1 tablet (800 mg total) by mouth every 8 (eight) hours as needed. 06/29/23  Yes Patrica Mendell, Britta Mccreedy, MD  methocarbamol (ROBAXIN) 500 MG tablet Take 1 tablet (500 mg total) by mouth at bedtime as needed for muscle spasms. 06/29/23  Yes Yared Susan, Britta Mccreedy, MD  hydrOXYzine (VISTARIL) 25 MG capsule Take 1 capsule (25 mg total) by mouth at bedtime as needed for anxiety. Patient not taking: Reported on 10/10/2022 11/21/21   Marcine Matar, MD  metFORMIN (GLUCOPHAGE) 500 MG tablet TAKE 1 TABLET (500 MG TOTAL) BY MOUTH 2 (TWO) TIMES DAILY WITH A MEAL. 05/21/23   Marcine Matar,  MD  Multiple Vitamin (MULTIVITAMIN) capsule Take 1 capsule by mouth daily. Patient not taking: Reported on 02/09/2023    [provider]  rosuvastatin (CRESTOR) 5 MG tablet Take 1 tablet (5 mg total) by mouth daily. Stop Atorvastatin 02/20/23   Marcine Matar, MD  sertraline (ZOLOFT) 50 MG tablet Take 1 tablet (50 mg total) by mouth daily. 10/10/22   Marcine Matar, MD  TRUEplus Lancets 28G MISC Use as directed Patient not taking: Reported on 10/10/2022 08/17/20   Rema Fendt, NP    Family History Family History  Problem Relation Age of Onset   Diabetes Mother    Cancer Mother    Ovarian cancer Mother    Breast cancer Maternal Aunt    Breast cancer Paternal Aunt    Breast cancer Paternal Aunt    Diabetes Brother    Cancer Brother     Social History Social History   Tobacco  Use   Smoking status: Never   Smokeless tobacco: Never  Vaping Use   Vaping status: Never Used  Substance Use Topics   Alcohol use: No   Drug use: No     Allergies   Lipitor [atorvastatin]   Review of Systems Review of Systems As per HPI  Physical Exam Triage Vital Signs ED Triage Vitals  Encounter Vitals Group     BP 06/29/23 1737 120/84     Systolic BP Percentile --      Diastolic BP Percentile --      Pulse Rate 06/29/23 1737 78     Resp 06/29/23 1737 (!) 22     Temp 06/29/23 1737 98.1 F (36.7 C)     Temp Source 06/29/23 1737 Oral     SpO2 06/29/23 1737 98 %     Weight --      Height --      Head Circumference --      Peak Flow --      Pain Score 06/29/23 1734 8     Pain Loc --      Pain Education --      Exclude from Growth Chart --    No data found.  Updated Vital Signs BP 120/84 (BP Location: Left Arm) Comment (BP Location): large cuff  Pulse 78   Temp 98.1 F (36.7 C) (Oral)   Resp (!) 22   LMP  (LMP Unknown)   SpO2 99%   Visual Acuity Right Eye Distance:   Left Eye Distance:   Bilateral Distance:    Right Eye Near:   Left Eye Near:    Bilateral Near:     Physical Exam Vitals and nursing note reviewed.  Constitutional:      General: She is not in acute distress.    Appearance: She is not ill-appearing.  Cardiovascular:     Rate and Rhythm: Normal rate and regular rhythm.     Pulses: Normal pulses.     Heart sounds: Normal heart sounds.  Pulmonary:     Effort: Pulmonary effort is normal. No respiratory distress.     Breath sounds: Normal breath sounds. No stridor. No wheezing.  Abdominal:     General: Bowel sounds are normal.     Palpations: Abdomen is soft.  Musculoskeletal:     Comments: Tenderness on palpation of the left trapezius muscle and pectoralis muscle.  Patient also has tenderness of the left paraspinal muscle of the thoracic and cervical spine.  Full range of motion of the left shoulder.  Patient's neck  is supple.  Full  range of motion of the cervical spine with left-sided neck pain.  No bruising noted.  Neurological:     Mental Status: She is alert.      UC Treatments / Results  Labs (all labs ordered are listed, but only abnormal results are displayed) Labs Reviewed - No data to display  EKG   Radiology No results found.  Procedures Procedures (including critical care time)  Medications Ordered in UC Medications  ketorolac (TORADOL) 30 MG/ML injection 30 mg (30 mg Intramuscular Given 06/29/23 1822)    Initial Impression / Assessment and Plan / UC Course  I have reviewed the triage vital signs and the nursing notes.  Pertinent labs & imaging results that were available during my care of the patient were reviewed by me and considered in my medical decision making (see chart for details).     1.  Right upper back pain Final Clinical Impressions(s) / UC Diagnoses   Final diagnoses:  Upper back pain on right side  Shortness of breath     Discharge Instructions      Please apply heating pad on a 20-minute on-20 minutes off cycle As the pain improves, please do gentle stretching exercises Please take medications as prescribed Chest x-ray is negative for pneumonia If you have worsening pain, shortness of breath or dizziness please return to urgent care to be reevaluated.   ED Prescriptions     Medication Sig Dispense Auth. Provider   ibuprofen (ADVIL) 800 MG tablet Take 1 tablet (800 mg total) by mouth every 8 (eight) hours as needed. 21 tablet Jakel Alphin, Britta Mccreedy, MD   methocarbamol (ROBAXIN) 500 MG tablet Take 1 tablet (500 mg total) by mouth at bedtime as needed for muscle spasms. 15 tablet Viveca Beckstrom, Britta Mccreedy, MD      PDMP not reviewed this encounter.   Merrilee Jansky, MD 06/29/23 [phone]

## 2023-06-29 NOTE — Discharge Instructions (Addendum)
Please apply heating pad on a 20-minute on-20 minutes off cycle As the pain improves, please do gentle stretching exercises Please take medications as prescribed Chest x-ray is negative for pneumonia If you have worsening pain, shortness of breath or dizziness please return to urgent care to be reevaluated.

## 2023-06-29 NOTE — ED Triage Notes (Signed)
Pain in left neck, left upper back and sometimes left chest.  Notices pain with deep inspiration this pain started Friday morning.  No known injury.  Reports sudden onset Friday morning.     Has taken ibuprofen.    Has felt pain like this before, but breathing issue has not been experienced before.

## 2023-06-30 ENCOUNTER — Other Ambulatory Visit: Payer: Self-pay

## 2023-06-30 MED ORDER — SERTRALINE HCL 50 MG PO TABS
50.0000 mg | ORAL_TABLET | Freq: Every day | ORAL | 0 refills | Status: DC
  Filled 2023-06-30: qty 90, 90d supply, fill #0

## 2023-06-30 MED ORDER — METFORMIN HCL 500 MG PO TABS
500.0000 mg | ORAL_TABLET | Freq: Two times a day (BID) | ORAL | 0 refills | Status: DC
  Filled 2023-06-30: qty 180, 90d supply, fill #0

## 2023-06-30 NOTE — Telephone Encounter (Signed)
Requested Prescriptions  Pending Prescriptions Disp Refills   sertraline (ZOLOFT) 50 MG tablet 90 tablet 0    Sig: Take 1 tablet (50 mg total) by mouth daily.     Psychiatry:  Antidepressants - SSRI - sertraline Passed - 06/29/2023  8:54 AM      Passed - AST in normal range and within 360 days    AST  Date Value Ref Range Status  02/09/2023 25 0 - 40 IU/L Final         Passed - ALT in normal range and within 360 days    ALT  Date Value Ref Range Status  02/09/2023 24 0 - 32 IU/L Final         Passed - Completed PHQ-2 or PHQ-9 in the last 360 days      Passed - Valid encounter within last 6 months    Recent Outpatient Visits           4 months ago Type 2 diabetes mellitus with obesity (HCC)   [place] [facility] Jonah Blue B, MD   8 months ago Type 2 diabetes mellitus with obesity [facility])   [place] [facility] Jonah Blue B, MD   1 year ago Type 2 diabetes mellitus with obesity [facility])   [place] [facility] Marcine Matar, MD   1 year ago GAD (generalized anxiety disorder)   [place] [facility] Jonah Blue B, MD   1 year ago Type 2 diabetes mellitus with obesity [facility])   [facility] Marcine Matar, MD       Future Appointments             In 1 month Marcine Matar, MD  [facility]             metFORMIN (GLUCOPHAGE) 500 MG tablet 180 tablet 0    Sig: TAKE 1 TABLET (500 MG TOTAL) BY MOUTH 2 (TWO) TIMES DAILY WITH A MEAL.     Endocrinology:  Diabetes - Biguanides Failed - 06/29/2023  8:54 AM      Failed - B12 Level in normal range and within 720 days    No results found for: "VITAMINB12"       Failed - CBC within normal limits and completed in the last 12 months    WBC  Date Value Ref Range Status  10/10/2022 6.6 3.4 - 10.8 x10E3/uL Final  12/19/2015 15.2 (H) 4.0 -  10.5 K/uL Final    Comment:    WHITE COUNT CONFIRMED ON SMEAR   RBC  Date Value Ref Range Status  10/10/2022 4.60 3.77 - 5.28 x10E6/uL Final  12/19/2015 4.03 3.87 - 5.11 MIL/uL Final   Hemoglobin  Date Value Ref Range Status  10/10/2022 12.6 11.1 - 15.9 g/dL Final   Hematocrit  Date Value Ref Range Status  10/10/2022 39.1 34.0 - 46.6 % Final   MCHC  Date Value Ref Range Status  10/10/2022 32.2 31.5 - 35.7 g/dL Final  14/78/2956 21.3 30.0 - 36.0 g/dL Final   [facility]  Date Value Ref Range Status  10/10/2022 27.4 26.6 - 33.0 pg Final  12/19/2015 25.8 (L) 26.0 - 34.0 pg Final   MCV  Date Value Ref Range Status  10/10/2022 85 79 - 97 fL Final   No results found for: "PLTCOUNTKUC", "LABPLAT", "POCPLA" RDW  Date Value Ref Range Status  10/10/2022  13.3 11.7 - 15.4 % Final         Passed - Cr in normal range and within 360 days    Creatinine, Ser  Date Value Ref Range Status  10/10/2022 0.57 0.57 - 1.00 mg/dL Final   Creatinine,U  Date Value Ref Range Status  08/01/2008 131.5 mg/dL Final    Comment:    See lab report for associated comment(s)         Passed - HBA1C is between 0 and 7.9 and within 180 days    HbA1c, POC (controlled diabetic range)  Date Value Ref Range Status  02/09/2023 6.4 0.0 - 7.0 % Final         Passed - eGFR in normal range and within 360 days    GFR calc Af Amer  Date Value Ref Range Status  08/17/2020 111 >59 mL/min/1.73 Final    Comment:    **In accordance with recommendations from the NKF-ASN Task force,**   [facility] is in the process of updating its eGFR calculation to the   2021 CKD-EPI creatinine equation that estimates kidney function   without a race variable.    GFR calc non Af Amer  Date Value Ref Range Status  08/17/2020 96 >59 mL/min/1.73 Final   eGFR  Date Value Ref Range Status  10/10/2022 112 >59 mL/min/1.73 Final         Passed - Valid encounter within last 6 months    Recent Outpatient Visits           4 months  ago Type 2 diabetes mellitus with obesity (HCC)   [place] [facility] Jonah Blue B, MD   8 months ago Type 2 diabetes mellitus with obesity [facility])   [place] [facility] Jonah Blue B, MD   1 year ago Type 2 diabetes mellitus with obesity [facility])   [facility] Marcine Matar, MD   1 year ago GAD (generalized anxiety disorder)   [place] [facility] Marcine Matar, MD   1 year ago Type 2 diabetes mellitus with obesity [facility])   [place] [facility] Marcine Matar, MD       Future Appointments             In 1 month Laural Benes, Binnie Rail, MD [facility]

## 2023-07-01 ENCOUNTER — Other Ambulatory Visit: Payer: Self-pay

## 2023-08-14 ENCOUNTER — Encounter: Payer: Self-pay | Admitting: Internal Medicine

## 2023-08-14 ENCOUNTER — Telehealth: Payer: Self-pay | Admitting: Internal Medicine

## 2023-08-14 ENCOUNTER — Ambulatory Visit: Payer: Self-pay | Attending: Internal Medicine | Admitting: Internal Medicine

## 2023-08-14 ENCOUNTER — Other Ambulatory Visit: Payer: Self-pay

## 2023-08-14 VITALS — BP 116/80 | HR 78 | Wt 186.8 lb

## 2023-08-14 DIAGNOSIS — F411 Generalized anxiety disorder: Secondary | ICD-10-CM

## 2023-08-14 DIAGNOSIS — E119 Type 2 diabetes mellitus without complications: Secondary | ICD-10-CM

## 2023-08-14 DIAGNOSIS — F321 Major depressive disorder, single episode, moderate: Secondary | ICD-10-CM

## 2023-08-14 DIAGNOSIS — Z7984 Long term (current) use of oral hypoglycemic drugs: Secondary | ICD-10-CM

## 2023-08-14 DIAGNOSIS — E1169 Type 2 diabetes mellitus with other specified complication: Secondary | ICD-10-CM

## 2023-08-14 DIAGNOSIS — E669 Obesity, unspecified: Secondary | ICD-10-CM

## 2023-08-14 DIAGNOSIS — E785 Hyperlipidemia, unspecified: Secondary | ICD-10-CM

## 2023-08-14 LAB — POCT GLYCOSYLATED HEMOGLOBIN (HGB A1C): HbA1c, POC (controlled diabetic range): 6.6 % (ref 0.0–7.0)

## 2023-08-14 LAB — GLUCOSE, POCT (MANUAL RESULT ENTRY): POC Glucose: 102 mg/dL — AB (ref 70–99)

## 2023-08-14 MED ORDER — INFLUENZA VIRUS VACC SPLIT PF (FLUZONE) 0.5 ML IM SUSY
0.5000 mL | PREFILLED_SYRINGE | Freq: Once | INTRAMUSCULAR | 0 refills | Status: AC
  Filled 2023-08-14: qty 0.5, 1d supply, fill #0

## 2023-08-14 MED ORDER — METFORMIN HCL 500 MG PO TABS
500.0000 mg | ORAL_TABLET | Freq: Two times a day (BID) | ORAL | 3 refills | Status: DC
  Filled 2023-08-14 – 2023-10-12 (×2): qty 180, 90d supply, fill #0
  Filled 2023-12-09 – 2024-01-11 (×2): qty 180, 90d supply, fill #1

## 2023-08-14 MED ORDER — ROSUVASTATIN CALCIUM 5 MG PO TABS
5.0000 mg | ORAL_TABLET | Freq: Every day | ORAL | 1 refills | Status: DC
  Filled 2023-08-14 – 2023-08-21 (×2): qty 90, 90d supply, fill #0
  Filled 2023-11-05 – 2023-11-09 (×4): qty 90, 90d supply, fill #1

## 2023-08-14 MED ORDER — SERTRALINE HCL 100 MG PO TABS
100.0000 mg | ORAL_TABLET | Freq: Every day | ORAL | 5 refills | Status: DC
  Filled 2023-08-14: qty 30, 30d supply, fill #0
  Filled 2023-10-12 (×2): qty 30, 30d supply, fill #1
  Filled 2023-11-05 – 2023-11-09 (×4): qty 30, 30d supply, fill #2
  Filled 2023-12-09 – 2023-12-10 (×2): qty 30, 30d supply, fill #3
  Filled 2024-01-11 (×2): qty 30, 30d supply, fill #4

## 2023-08-14 NOTE — Progress Notes (Addendum)
Patient ID: Theresa Barry, female    DOB: 05-29-1976  MRN: [id number]  CC: Diabetes and Medication Refill   Subjective: Theresa Barry is a 47 y.o. female who presents for chronic ds management. Her concerns today include:  Pt with hx of DM,  HL, H.pylori, polyarthralgia, anxiety   [employer] Language interpreter used during this encounter. # Shari Prows [phone]  DM: Results for orders placed or performed in visit on 08/14/23  POCT glucose (manual entry)  Result Value Ref Range   POC Glucose 102 (A) 70 - 99 mg/dl  POCT glycosylated hemoglobin (Hb A1C)  Result Value Ref Range   Hemoglobin A1C     HbA1c POC (<> result, manual entry)     HbA1c, POC (prediabetic range)     HbA1c, POC (controlled diabetic range) 6.6 0.0 - 7.0 %  Patient reports compliance with metformin 500 mg twice a day.  Not checking BS.  Does not have testing supplies and does not want.  Reports doing well with eating habits.  Tries to eat healthy and drinks a lot of water. Walks 3 x/wk for 30 mins.  Wgh increase 8 lbs since last visit. Taking and tolerating Crestor Over due for DM eye exam.  No insurance Still struggles with depression/anxiety.  Reports compliance with Zoloft 50 mg daily.  Takes Hydroxyzine at bedtime PRN.  Does not have counselor but feels she would benefit.  Denies any SI at this time.  Reports she use to in the past but not recently.  HM:  due for flu shot Patient Active Problem List   Diagnosis Date Noted   GAD (generalized anxiety disorder) 11/21/2021   Positive depression screening 11/21/2021   Hyperlipidemia associated with type 2 diabetes mellitus (HCC) 06/05/2021   Abnormal LFTs 06/05/2021   Breast pain, left 03/31/2019   Screening breast examination 12/18/2017   Chronic radicular pain of lower back 05/19/2017   Prolapsing fibroid of cervix 05/16/2013   Lipoma 08/16/2007     Current Outpatient Medications on File Prior to Visit  Medication Sig Dispense Refill    hydrOXYzine (VISTARIL) 25 MG capsule Take 1 capsule (25 mg total) by mouth at bedtime as needed for anxiety. 30 capsule 1   ibuprofen (ADVIL) 800 MG tablet Take 1 tablet (800 mg total) by mouth every 8 (eight) hours as needed. 21 tablet 0   methocarbamol (ROBAXIN) 500 MG tablet Take 1 tablet (500 mg total) by mouth at bedtime as needed for muscle spasms. 15 tablet 0   Multiple Vitamin (MULTIVITAMIN) capsule Take 1 capsule by mouth daily.     TRUEplus Lancets 28G MISC Use as directed 100 each 4   No current facility-administered medications on file prior to visit.    Allergies  Allergen Reactions   Lipitor [Atorvastatin] Other (See Comments)    nausea    Social History   Socioeconomic History   Marital status: Married    Spouse name: Not on file   Number of children: 3   Years of education: Not on file   Highest education level: High school graduate  Occupational History   Not on file  Tobacco Use   Smoking status: Never   Smokeless tobacco: Never  Vaping Use   Vaping status: Never Used  Substance and Sexual Activity   Alcohol use: No   Drug use: No   Sexual activity: Yes    Birth control/protection: Surgical  Other Topics Concern   Not on file  Social History Narrative   Not on file  Social Determinants of Health   Financial Resource Strain: Not on file  Food Insecurity: No Food Insecurity (08/14/2023)   Hunger Vital Sign    Worried About Running Out of Food in the Last Year: Never true    Ran Out of Food in the Last Year: Never true  Transportation Needs: No Transportation Needs (03/20/2022)   PRAPARE - Administrator, Civil Service (Medical): No    Lack of Transportation (Non-Medical): No  Physical Activity: Not on file  Stress: Not on file  Social Connections: Not on file  Intimate Partner Violence: Not on file    Family History  Problem Relation Age of Onset   Diabetes Mother    Cancer Mother    Ovarian cancer Mother    Breast cancer  Maternal Aunt    Breast cancer Paternal Aunt    Breast cancer Paternal Aunt    Diabetes Brother    Cancer Brother     Past Surgical History:  Procedure Laterality Date   ABDOMINAL HYSTERECTOMY     BILATERAL SALPINGECTOMY Bilateral 12/18/2015   Procedure: BILATERAL SALPINGECTOMY;  Surgeon: Adam Phenix, MD;  Location: [facility];  Service: Gynecology;  Laterality: Bilateral;   fibroidectomy     vaginally   HYSTEROSCOPY WITH D & C N/A 05/16/2013   Procedure: DILATATION AND CURETTAGE /HYSTEROSCOPY  Removal of Salome Arnt FIBROID;  Surgeon: Tereso Newcomer, MD;  Location: [facility];  Service: Gynecology;  Laterality: N/A;   OVARIAN CYST REMOVAL     VAGINAL HYSTERECTOMY N/A 12/18/2015   Procedure: HYSTERECTOMY VAGINAL;  Surgeon: Adam Phenix, MD;  Location: [facility];  Service: Gynecology;  Laterality: N/A;    ROS: Review of Systems Negative except as stated above  PHYSICAL EXAM: BP 116/80 (BP Location: Left Arm, Patient Position: Sitting, Cuff Size: Normal)   Pulse 78   Wt 186 lb 12.8 oz (84.7 kg)   LMP  (LMP Unknown)   SpO2 98%   BMI 34.17 kg/m   Wt Readings from Last 3 Encounters:  08/14/23 186 lb 12.8 oz (84.7 kg)  02/09/23 178 lb (80.7 kg)  10/10/22 178 lb (80.7 kg)    Physical Exam  General appearance - alert, well appearing, and in no distress Mental status - normal mood, behavior, speech, dress, motor activity, and thought processes Neck - supple, no significant adenopathy Chest - clear to auscultation, no wheezes, rales or rhonchi, symmetric air entry Heart - normal rate, regular rhythm, normal S1, S2, no murmurs, rubs, clicks or gallops Extremities - peripheral pulses normal, no pedal edema, no clubbing or cyanosis     08/14/2023    4:39 PM 02/09/2023    4:13 PM 10/10/2022    3:44 PM  Depression screen PHQ 2/9  Decreased Interest 2 2 1   Down, Depressed, Hopeless 2 2 2   PHQ - 2 Score 4 4 3   Altered sleeping 2 2 2   Tired, decreased energy 2 2 2   Change in appetite 2 2 2    Feeling bad or failure about yourself  2 2 2   Trouble concentrating 2 2 2   Moving slowly or fidgety/restless 2 2 2   Suicidal thoughts 1 2 1   PHQ-9 Score 17 18 16       08/14/2023    4:39 PM 02/09/2023    4:54 PM 10/10/2022    3:44 PM 01/31/2022    4:06 PM  GAD 7 : Generalized Anxiety Score  Nervous, Anxious, on Edge 1 2 2 3   Control/stop worrying 1 2 2  2  Worry too much - different things 2 2 2 3   Trouble relaxing 2 2 2 2   Restless 2 2 2 2   Easily annoyed or irritable 2 2 2 2   Afraid - awful might happen 2 2 2 2   Total GAD 7 Score 12 14 14 16         Latest Ref Rng & Units 02/09/2023    4:52 PM 10/10/2022    4:10 PM 11/21/2021    4:38 PM  CMP  Glucose 70 - 99 mg/dL  83  98   BUN 6 - 24 mg/dL  9  13   Creatinine 6.21 - 1.00 mg/dL  3.08  6.57   Sodium 846 - 144 mmol/L  139  140   Potassium 3.5 - 5.2 mmol/L  4.1  4.0   Chloride 96 - 106 mmol/L  104  100   CO2 20 - 29 mmol/L  23  19   Calcium 8.7 - 10.2 mg/dL  8.9  9.7   Total Protein 6.0 - 8.5 g/dL 7.1  7.1  7.3   Total Bilirubin 0.0 - 1.2 mg/dL 0.3  0.3  0.3   Alkaline Phos 44 - 121 IU/L 112  108  92   AST 0 - 40 IU/L 25  21  23    ALT 0 - 32 IU/L 24  23  25     Lipid Panel     Component Value Date/Time   CHOL 168 02/09/2023 1652   TRIG 476 (H) 02/09/2023 1652   HDL 37 (L) 02/09/2023 1652   CHOLHDL 4.5 (H) 02/09/2023 1652   CHOLHDL 3.4 Ratio 12/01/2007 2012   VLDL 32 12/01/2007 2012   LDLCALC 59 02/09/2023 1652    CBC    Component Value Date/Time   WBC 6.6 10/10/2022 1610   WBC 15.2 (H) 12/19/2015 0553   RBC 4.60 10/10/2022 1610   RBC 4.03 12/19/2015 0553   HGB 12.6 10/10/2022 1610   HCT 39.1 10/10/2022 1610   PLT 322 10/10/2022 1610   MCV 85 10/10/2022 1610   MCH 27.4 10/10/2022 1610   MCH 25.8 (L) 12/19/2015 0553   MCHC 32.2 10/10/2022 1610   MCHC 33.4 12/19/2015 0553   RDW 13.3 10/10/2022 1610   LYMPHSABS 2.1 06/24/2018 1522   MONOABS 0.8 08/12/2015 1850   EOSABS 0.1 06/24/2018 1522   BASOSABS 0.0  06/24/2018 1522    ASSESSMENT AND PLAN: 1. Type 2 diabetes mellitus with obesity (HCC) At goal.  Continue metformin and healthy eating habits.  Encouraged her to increase her exercise to 4 to 5 days a week for 30 minutes. - POCT glucose (manual entry) - POCT glycosylated hemoglobin (Hb A1C) - metFORMIN (GLUCOPHAGE) 500 MG tablet; TAKE 1 TABLET (500 MG TOTAL) BY MOUTH 2 (TWO) TIMES DAILY WITH A MEAL.  Dispense: 180 tablet; Refill: 3 - Microalbumin / creatinine urine ratio  2. Diabetes mellitus treated with oral medication (HCC) See #1 above.  3. Hyperlipidemia associated with type 2 diabetes mellitus (HCC) Continue Crestor.  4. GAD (generalized anxiety disorder) Patient feels she would benefit from higher dose of Zoloft.  We increased to 100 mg.  Advised that if she develops any suicidal thoughts or plans, she should be seen in the emergency room.  Message sent to our LCSW requesting some counseling sessions for her. - sertraline (ZOLOFT) 100 MG tablet; Take 1 tablet (100 mg total) by mouth daily.  Dispense: 30 tablet; Refill: 5  5. Moderate major depression (HCC) - sertraline (ZOLOFT) 100  MG tablet; Take 1 tablet (100 mg total) by mouth daily.  Dispense: 30 tablet; Refill: 5  Encouraged patient to stop at the pharmacy downstairs today to get flu shot for free.   Patient was given the opportunity to ask questions.  Patient verbalized understanding of the plan and was able to repeat key elements of the plan.   This documentation was completed using Paediatric nurse.  Any transcriptional errors are unintentional.  Orders Placed This Encounter  Procedures   Microalbumin / creatinine urine ratio   POCT glucose (manual entry)   POCT glycosylated hemoglobin (Hb A1C)     Requested Prescriptions   Signed Prescriptions Disp Refills   sertraline (ZOLOFT) 100 MG tablet 30 tablet 5    Sig: Take 1 tablet (100 mg total) by mouth daily.   rosuvastatin (CRESTOR) 5 MG  tablet 90 tablet 1    Sig: Take 1 tablet (5 mg total) by mouth daily. Stop Atorvastatin   metFORMIN (GLUCOPHAGE) 500 MG tablet 180 tablet 3    Sig: TAKE 1 TABLET (500 MG TOTAL) BY MOUTH 2 (TWO) TIMES DAILY WITH A MEAL.    Return in about 4 months (around 12/12/2023).  Jonah Blue, MD, FACP

## 2023-08-17 ENCOUNTER — Other Ambulatory Visit: Payer: Self-pay

## 2023-08-18 LAB — MICROALBUMIN / CREATININE URINE RATIO
Creatinine, Urine: 24.4 mg/dL
Microalb/Creat Ratio: <12 mg/g{creat} (ref 0–29)
Microalbumin, Urine: <3 ug/mL

## 2023-08-19 ENCOUNTER — Telehealth: Payer: Self-pay | Admitting: Licensed Clinical Social Worker

## 2023-08-19 NOTE — Telephone Encounter (Signed)
LCSWA called using interpreter [phone] patient today to introduce herself and to assess patients' mental health needs.Patient was referred by PCP sx of depression and anxiety and feels she would benefit from some counseling.

## 2023-08-19 NOTE — Telephone Encounter (Signed)
Received and appt set. Thank you

## 2023-08-21 ENCOUNTER — Other Ambulatory Visit: Payer: Self-pay

## 2023-08-26 ENCOUNTER — Other Ambulatory Visit: Payer: Self-pay

## 2023-08-26 ENCOUNTER — Ambulatory Visit: Payer: Self-pay | Attending: Licensed Clinical Social Worker | Admitting: Licensed Clinical Social Worker

## 2023-08-26 DIAGNOSIS — F321 Major depressive disorder, single episode, moderate: Secondary | ICD-10-CM

## 2023-08-26 NOTE — BH Specialist Note (Signed)
Integrated Behavioral Health Initial In-Person Visit  MRN: [id number] Name: Adriann Carmona-Castro  Number of Integrated Behavioral Health Clinician visits: 1- Initial Visit  Session Start time: 1615    Session End time: 1700  Total time in minutes: 45   Types of Service: Video visit  Interpretor:Yes.   Interpretor Name and Language: Spanish Angela Adam [phone])   Warm Hand Off Completed.     Subjective: Keri Judeth Horn is a 47 y.o. female accompanied by  interpreter  Patient was referred by PCP for Depression. Patient reports the following symptoms/concerns: Depression  Duration of problem: few Months; Severity of problem: mild  Objective: Mood: Depressed and Hopeless and Affect: Depressed and Tearful Risk of harm to self or others: No plan to harm self or others  Life Context: Family and Social: Pt lives with her husband and 3 children  School/Work: didn't mention Self-Care: didn't mention Life Changes: Son is using drugs  Patient and/or Family's Strengths/Protective Factors: Social and Emotional competence  Goals Addressed: Patient will: Reduce symptoms of: depression and stress Increase knowledge and/or ability of: self-management skills and stress reduction  Demonstrate ability to: Increase adequate support systems for patient/family  Progress towards Goals: Ongoing  Interventions: Interventions utilized: Motivational Interviewing, Solution-Focused Strategies, and DBT Dialectal Behavioral Therapy  Standardized Assessments completed: GAD-7 and PHQ 9  Patient and/or Family Response: Patient stated that she is feeling depressed because her son is using marijuana.  She does not want to kick him out of the home because he also helps to pay bills in the home.  Patient feels extremely overwhelmed and does not know how to navigate the stressed.   Patient Centered Plan: Patient is on the following Treatment Plan(s):  Navigate depression    Assessment: Patient currently experiencing depression due to life stressors .   Patient may benefit from coping strategies and leaning into her social supports .  Plan: Follow up with behavioral health clinician on : 4 weeks Behavioral recommendations: Taking to husband to help reduce stress Referral(s): Integrated [employer] (In Clinic) "From scale of 1-10, how likely are you to follow plan?": most likely  Vassie Loll, [place]

## 2023-09-09 DIAGNOSIS — F321 Major depressive disorder, single episode, moderate: Secondary | ICD-10-CM | POA: Insufficient documentation

## 2023-09-23 ENCOUNTER — Ambulatory Visit: Payer: Self-pay | Admitting: Licensed Clinical Social Worker

## 2023-10-12 ENCOUNTER — Other Ambulatory Visit: Payer: Self-pay

## 2023-11-05 ENCOUNTER — Other Ambulatory Visit: Payer: Self-pay

## 2023-11-09 ENCOUNTER — Other Ambulatory Visit (HOSPITAL_COMMUNITY): Payer: Self-pay

## 2023-11-09 ENCOUNTER — Other Ambulatory Visit: Payer: Self-pay

## 2023-11-10 ENCOUNTER — Other Ambulatory Visit: Payer: Self-pay

## 2023-11-11 ENCOUNTER — Other Ambulatory Visit: Payer: Self-pay

## 2023-12-10 ENCOUNTER — Other Ambulatory Visit: Payer: Self-pay

## 2023-12-11 ENCOUNTER — Other Ambulatory Visit: Payer: Self-pay

## 2023-12-18 ENCOUNTER — Ambulatory Visit: Payer: Self-pay | Admitting: Internal Medicine

## 2024-01-11 ENCOUNTER — Other Ambulatory Visit: Payer: Self-pay

## 2024-01-13 ENCOUNTER — Other Ambulatory Visit: Payer: Self-pay

## 2024-01-14 ENCOUNTER — Other Ambulatory Visit: Payer: Self-pay

## 2024-02-09 ENCOUNTER — Ambulatory Visit: Payer: Self-pay | Attending: Internal Medicine | Admitting: Internal Medicine

## 2024-02-09 ENCOUNTER — Other Ambulatory Visit: Payer: Self-pay

## 2024-02-09 ENCOUNTER — Encounter: Payer: Self-pay | Admitting: Internal Medicine

## 2024-02-09 VITALS — BP 100/68 | HR 71 | Temp 98.7°F | Ht 62.0 in | Wt 181.0 lb

## 2024-02-09 DIAGNOSIS — F411 Generalized anxiety disorder: Secondary | ICD-10-CM

## 2024-02-09 DIAGNOSIS — Z6833 Body mass index (BMI) 33.0-33.9, adult: Secondary | ICD-10-CM

## 2024-02-09 DIAGNOSIS — E1169 Type 2 diabetes mellitus with other specified complication: Secondary | ICD-10-CM

## 2024-02-09 DIAGNOSIS — Z7984 Long term (current) use of oral hypoglycemic drugs: Secondary | ICD-10-CM

## 2024-02-09 DIAGNOSIS — E119 Type 2 diabetes mellitus without complications: Secondary | ICD-10-CM

## 2024-02-09 DIAGNOSIS — F3341 Major depressive disorder, recurrent, in partial remission: Secondary | ICD-10-CM

## 2024-02-09 DIAGNOSIS — Z23 Encounter for immunization: Secondary | ICD-10-CM

## 2024-02-09 DIAGNOSIS — E669 Obesity, unspecified: Secondary | ICD-10-CM

## 2024-02-09 DIAGNOSIS — E785 Hyperlipidemia, unspecified: Secondary | ICD-10-CM

## 2024-02-09 LAB — POCT GLYCOSYLATED HEMOGLOBIN (HGB A1C): HbA1c, POC (controlled diabetic range): 6.6 % (ref 0.0–7.0)

## 2024-02-09 LAB — GLUCOSE, POCT (MANUAL RESULT ENTRY): POC Glucose: 123 mg/dL — AB (ref 70–99)

## 2024-02-09 MED ORDER — SERTRALINE HCL 100 MG PO TABS
100.0000 mg | ORAL_TABLET | Freq: Every day | ORAL | 5 refills | Status: DC
  Filled 2024-02-09: qty 30, 30d supply, fill #0
  Filled 2024-03-14: qty 90, 90d supply, fill #1
  Filled 2024-06-13: qty 60, 60d supply, fill #2

## 2024-02-09 MED ORDER — ROSUVASTATIN CALCIUM 5 MG PO TABS
5.0000 mg | ORAL_TABLET | Freq: Every day | ORAL | 1 refills | Status: DC
  Filled 2024-02-09: qty 90, 90d supply, fill #0
  Filled 2024-05-27: qty 90, 90d supply, fill #1

## 2024-02-09 NOTE — Progress Notes (Signed)
 Patient ID: Theresa Barry, female    DOB: [birth year]  MRN: [id number]  CC: Follow-up and Diabetes (DM f/u. )   Subjective: Theresa Barry is a 48 y.o. female who presents for chronic ds management. Her concerns today include:  Pt with hx of DM,  HL, H.pylori, polyarthralgia, anxiety   AMN Language interpreter used during this encounter. #Fernanda [phone]  HL: compliant with Crestor  daily.  LDL 1 yr ago was 59. DM: Results for orders placed or performed in visit on 02/09/24  POCT glucose (manual entry)   Collection Time: 02/09/24  8:54 AM  Result Value Ref Range   POC Glucose 123 (A) 70 - 99 mg/dl  POCT glycosylated hemoglobin (Hb A1C)   Collection Time: 02/09/24  8:56 AM  Result Value Ref Range   Hemoglobin A1C     HbA1c POC (<> result, manual entry)     HbA1c, POC (prediabetic range)     HbA1c, POC (controlled diabetic range) 6.6 0.0 - 7.0 %  Patient reports compliance with metformin  500 mg twice a day.  Not checking BS.   Not doing as well as she would like with eating habits.  Eats a lot of bread and rice; at least 5x/wk.  Drinks water, coffee and lemon-aid with sugar. She was walking 3/wk for 30 mins but stopped several mths ago.  Not motivated. Despite this, wgh down 5 lbs since last visit  MDD/GAD: Zoloft  increased on last visit.  Reports doing well on it; feels partial remission. Has meet with our LCSW a few times since last visit.   Patient Active Problem List   Diagnosis Date Noted   Moderate major depression (HCC) 09/09/2023   GAD (generalized anxiety disorder) 11/21/2021   Positive depression screening 11/21/2021   Hyperlipidemia associated with type 2 diabetes mellitus (HCC) 06/05/2021   Abnormal LFTs 06/05/2021   Breast pain, left 03/31/2019   Screening breast examination 12/18/2017   Chronic radicular pain of lower back 05/19/2017   Prolapsing fibroid of cervix 05/16/2013   Lipoma 08/16/2007     Current Outpatient Medications on File  Prior to Visit  Medication Sig Dispense Refill   hydrOXYzine  (VISTARIL ) 25 MG capsule Take 1 capsule (25 mg total) by mouth at bedtime as needed for anxiety. 30 capsule 1   metFORMIN  (GLUCOPHAGE ) 500 MG tablet TAKE 1 TABLET (500 MG TOTAL) BY MOUTH 2 (TWO) TIMES DAILY WITH A MEAL. 180 tablet 3   Multiple Vitamin (MULTIVITAMIN) capsule Take 1 capsule by mouth daily.     TRUEplus Lancets 28G MISC Use as directed (Patient not taking: Reported on 02/09/2024) 100 each 4   No current facility-administered medications on file prior to visit.    Allergies  Allergen Reactions   Lipitor [Atorvastatin ] Other (See Comments)    nausea    Social History   Socioeconomic History   Marital status: Married    Spouse name: Not on file   Number of children: 3   Years of education: Not on file   Highest education level: High school graduate  Occupational History   Not on file  Tobacco Use   Smoking status: Never   Smokeless tobacco: Never  Vaping Use   Vaping status: Never Used  Substance and Sexual Activity   Alcohol use: No   Drug use: No   Sexual activity: Yes    Birth control/protection: Surgical  Other Topics Concern   Not on file  Social History Narrative   Not on file   Social Drivers of  Health   Financial Resource Strain: Not on file  Food Insecurity: No Food Insecurity (08/14/2023)   Hunger Vital Sign    Worried About Running Out of Food in the Last Year: Never true    Ran Out of Food in the Last Year: Never true  Transportation Needs: No Transportation Needs (03/20/2022)   PRAPARE - Administrator, Civil Service (Medical): No    Lack of Transportation (Non-Medical): No  Physical Activity: Not on file  Stress: Not on file  Social Connections: Not on file  Intimate Partner Violence: Not on file    Family History  Problem Relation Age of Onset   Diabetes Mother    Cancer Mother    Ovarian cancer Mother    Breast cancer Maternal Aunt    Breast cancer Paternal  Aunt    Breast cancer Paternal Aunt    Diabetes Brother    Cancer Brother     Past Surgical History:  Procedure Laterality Date   ABDOMINAL HYSTERECTOMY     BILATERAL SALPINGECTOMY Bilateral 12/18/2015   Procedure: BILATERAL SALPINGECTOMY;  Surgeon: Tresia Fruit, MD;  Location: [facility];  Service: Gynecology;  Laterality: Bilateral;   fibroidectomy     vaginally   HYSTEROSCOPY WITH D & C N/A 05/16/2013   Procedure: DILATATION AND CURETTAGE /HYSTEROSCOPY  Removal of Hettie Lota FIBROID;  Surgeon: Julianne Octave, MD;  Location: [facility];  Service: Gynecology;  Laterality: N/A;   OVARIAN CYST REMOVAL     VAGINAL HYSTERECTOMY N/A 12/18/2015   Procedure: HYSTERECTOMY VAGINAL;  Surgeon: Tresia Fruit, MD;  Location: [facility];  Service: Gynecology;  Laterality: N/A;    ROS: Review of Systems Negative except as stated above  PHYSICAL EXAM: BP 100/68 (BP Location: Left Arm, Patient Position: Sitting, Cuff Size: Normal)   Pulse 71   Temp 98.7 F (37.1 C) (Oral)   Ht 5\' 2"  (1.575 m)   Wt 181 lb (82.1 kg)   LMP  (LMP Unknown)   SpO2 100%   BMI 33.11 kg/m   Wt Readings from Last 3 Encounters:  02/09/24 181 lb (82.1 kg)  08/14/23 186 lb 12.8 oz (84.7 kg)  02/09/23 178 lb (80.7 kg)    Physical Exam  General appearance - alert, well appearing, and in no distress Mental status - normal mood, behavior, speech, dress, motor activity, and thought processes Chest - clear to auscultation, no wheezes, rales or rhonchi, symmetric air entry Heart - normal rate, regular rhythm, normal S1, S2, no murmurs, rubs, clicks or gallops Extremities - peripheral pulses normal, no pedal edema, no clubbing or cyanosis Skin: redness over both cheeks Diabetic Foot Exam - Simple   Simple Foot Form Diabetic Foot exam was performed with the following findings: Yes 02/09/2024  9:08 AM  Visual Inspection No deformities, no ulcerations, no other skin breakdown bilaterally: Yes Sensation Testing Intact to touch and  monofilament testing bilaterally: Yes Pulse Check Posterior Tibialis and Dorsalis pulse intact bilaterally: Yes Comments         Latest Ref Rng & Units 02/09/2023    4:52 PM 10/10/2022    4:10 PM 11/21/2021    4:38 PM  CMP  Glucose 70 - 99 mg/dL  83  98   BUN 6 - 24 mg/dL  9  13   Creatinine 1.61 - 1.00 mg/dL  0.96  0.45   Sodium 409 - 144 mmol/L  139  140   Potassium 3.5 - 5.2 mmol/L  4.1  4.0  Chloride 96 - 106 mmol/L  104  100   CO2 20 - 29 mmol/L  23  19   Calcium  8.7 - 10.2 mg/dL  8.9  9.7   Total Protein 6.0 - 8.5 g/dL 7.1  7.1  7.3   Total Bilirubin 0.0 - 1.2 mg/dL 0.3  0.3  0.3   Alkaline Phos 44 - 121 IU/L 112  108  92   AST 0 - 40 IU/L 25  21  23    ALT 0 - 32 IU/L 24  23  25     Lipid Panel     Component Value Date/Time   CHOL 168 02/09/2023 1652   TRIG 476 (H) 02/09/2023 1652   HDL 37 (L) 02/09/2023 1652   CHOLHDL 4.5 (H) 02/09/2023 1652   CHOLHDL 3.4 Ratio 12/01/2007 2012   VLDL 32 12/01/2007 2012   LDLCALC 59 02/09/2023 1652    CBC    Component Value Date/Time   WBC 6.6 10/10/2022 1610   WBC 15.2 (H) 12/19/2015 0553   RBC 4.60 10/10/2022 1610   RBC 4.03 12/19/2015 0553   HGB 12.6 10/10/2022 1610   HCT 39.1 10/10/2022 1610   PLT 322 10/10/2022 1610   MCV 85 10/10/2022 1610   MCH 27.4 10/10/2022 1610   MCH 25.8 (L) 12/19/2015 0553   MCHC 32.2 10/10/2022 1610   MCHC 33.4 12/19/2015 0553   RDW 13.3 10/10/2022 1610   LYMPHSABS 2.1 06/24/2018 1522   MONOABS 0.8 08/12/2015 1850   EOSABS 0.1 06/24/2018 1522   BASOSABS 0.0 06/24/2018 1522    ASSESSMENT AND PLAN: 1. Type 2 diabetes mellitus with obesity (HCC) (Primary) At goal.  Continue metformin  500 mg twice a day. Gave some suggestions to improve eating habits.  Advised switching to whole-grain wheat bread and try cutting back on eating rice to no more than 2-3 times a week.  Also advised using Splenda instead of sugar and lemonade.  She feels this is doable for her. Encouraged her to get back into  walking 3 to 5 days a week for 30 minutes.  Would be able to achieve greater weight loss with these lifestyle changes. - POCT glycosylated hemoglobin (Hb A1C) - POCT glucose (manual entry) - CBC - Comprehensive metabolic panel with GFR - Lipid panel  2. Diabetes mellitus treated with oral medication (HCC) See #1 above  3. Hyperlipidemia associated with type 2 diabetes mellitus (HCC) At goal.  Continue Crestor . - Lipid panel - rosuvastatin  (CRESTOR ) 5 MG tablet; Take 1 tablet (5 mg total) by mouth daily. Stop Atorvastatin   Dispense: 90 tablet; Refill: 1  4. Depression, major, recurrent, in partial remission (HCC) Doing well with Zoloft .  We will continue it for now. - sertraline  (ZOLOFT ) 100 MG tablet; Take 1 tablet (100 mg total) by mouth daily.  Dispense: 30 tablet; Refill: 5  5. GAD (generalized anxiety disorder) See #4 above - sertraline  (ZOLOFT ) 100 MG tablet; Take 1 tablet (100 mg total) by mouth daily.  Dispense: 30 tablet; Refill: 5  6. Need for vaccination against Streptococcus pneumoniae Given PCV 20 today.  7. Need for shingles vaccine Patient agreeable to receiving the Shingrix vaccine series.  Flu shot given today.    Patient was given the opportunity to ask questions.  Patient verbalized understanding of the plan and was able to repeat key elements of the plan.   This documentation was completed using Paediatric nurse.  Any transcriptional errors are unintentional.  Orders Placed This Encounter  Procedures   Pneumococcal  conjugate vaccine 20-valent   Varicella-zoster vaccine IM   CBC   Comprehensive metabolic panel with GFR   Lipid panel   POCT glycosylated hemoglobin (Hb A1C)   POCT glucose (manual entry)     Requested Prescriptions   Signed Prescriptions Disp Refills   rosuvastatin  (CRESTOR ) 5 MG tablet 90 tablet 1    Sig: Take 1 tablet (5 mg total) by mouth daily. Stop Atorvastatin    sertraline  (ZOLOFT ) 100 MG tablet 30 tablet 5     Sig: Take 1 tablet (100 mg total) by mouth daily.    Return in about 4 months (around 06/11/2024).  Concetta Dee, MD, FACP

## 2024-02-09 NOTE — Patient Instructions (Signed)
 Alimentacin saludable en los [employer], Adult Una alimentacin saludable puede ayudarlo a Barista y Pharmacologist un peso saludable, reducir el riesgo de tener enfermedades crnicas y vivir Neomia Dear vida larga y productiva. Es importante que siga una modalidad de alimentacin saludable. Sus necesidades nutricionales y calricas deben satisfacerse principalmente con distintos alimentos ricos en nutrientes. Consejos para seguir Surveyor, minerals Lea las etiquetas de los alimentos Lea las etiquetas y elija las que digan lo siguiente: Productos reducidos en sodio o con bajo contenido de [place]. Jugos con 100 % jugo de fruta. Alimentos con bajo contenido de grasas saturadas (menos de 3 g por porcin) y alto contenido de grasas poliinsaturadas y Mining engineer. Alimentos con cereales integrales, como trigo integral, trigo partido, arroz integral y arroz salvaje. Cereales integrales fortificados con cido flico. Esto se recomienda a las mujeres embarazadas o que desean quedar embarazadas. Lea las etiquetas y no coma ni beba lo siguiente: Alimentos o bebidas con azcar agregada. Estos incluyen los alimentos que contienen azcar moreno, endulzante a base de maz, jarabe de maz, dextrosa, fructosa, glucosa, jarabe de maz de alta fructosa, miel, azcar invertido, lactosa, jarabe de [place], maltosa, Sunflower, azcar sin refinar, sacarosa, trehalosa y azcar turbinado. Limite el consumo de azcar agregada a menos del 10 % del total de caloras diarias. No consuma ms que las siguientes cantidades de azcar agregada por da: 6 cucharaditas (25 g) para las mujeres. 9 cucharaditas (38 g) para los hombres. Los alimentos que contienen almidones y cereales refinados o procesados. Los productos de cereales refinados, como harina blanca, harina de maz desgerminada, pan blanco y arroz blanco. Al ir de compras Elija refrigerios ricos en nutrientes, como verduras, frutas enteras y frutos secos. Evite los refrigerios con  alto contenido de caloras y International aid/development worker, como las papas fritas, los refrigerios frutales y los caramelos. Use alios y productos para untar a base de aceite con los Publishing rights manager de grasas slidas como la [place], la [place], la crema agria o el queso crema. Limite las salsas, las mezclas y los productos "instantneos" preelaborados como el arroz saborizado, los fideos instantneos y las pastas listas para comer. Pruebe ms fuentes de protena vegetal, como tofu, tempeh, frijoles negros, edamame, lentejas, frutos secos y semillas. Explore planes de alimentacin como la dieta mediterrnea o la dieta vegetariana. Pruebe salsas cardiosaludables hechas con frijoles y grasas saludables, como hummus y [place]. Las verduras van muy bien con ellas. Al cocinar Use aceite para Designer, multimedia de grasas slidas como [place], margarina o [place] de [place]. En lugar de frer, trate de cocinar en el horno, en la plancha o en la parrilla, o hervir los alimentos. Retire la parte grasa de las carnes antes de cocinarlas. Cocine las verduras al vapor en agua o caldo. Planificacin de las comidas  En las comidas, imagine dividir su plato en cuartos: La mitad del plato tiene frutas y verduras. Un cuarto del plato tiene cereales integrales. Un cuarto del plato tiene protena, especialmente carnes [place], aves, huevos, tofu, frijoles o frutos secos. Incluya lcteos descremados en su dieta diaria. Estilo de vida Elija opciones saludables en todos los mbitos, como en el hogar, el [place], la [place], los restaurantes y [place]. Prepare los alimentos de un modo seguro: Lvese las manos despus de manipular carnes crudas. Donde prepare alimentos, mantenga las superficies limpias lavndolas regularmente con agua caliente y [place]. Mantenga las carnes crudas separadas de los alimentos que estn listos para comer como las frutas y las verduras. Cocine los frutos  de mar, carnes, aves y Loss adjuster, chartered la temperatura recomendada. Consiga un termmetro para alimentos. Almacene los alimentos a temperaturas seguras. En general: Mantenga los alimentos fros a una temperatura de 40 F (4,4 C) o inferior. Mantenga los alimentos calientes a una temperatura de 140 F (60 C) o superior. Mantenga el congelador a una temperatura de 0 F (-17,8 C) o inferior. Los alimentos no son seguros para su consumo cuando han estado a una temperatura de entre 40 y 140 F (4.4 y 60 C) por ms de 2 horas. Qu alimentos debo comer? Frutas Propngase comer entre 1 y 2 tazas de frutas frescas, Primary school teacher (en su jugo natural) o Primary school teacher. Una taza de fruta equivale a 1 manzana pequea, 1 banana grande, 8 fresas grandes, 1 taza (237 g) de fruta enlatada,  taza (82 g) de fruta seca o 1 taza (240 ml) de jugo al 100 %. Verduras Propngase comer de 2 a 4 tazas de verduras frescas y Primary school teacher, incluyendo diferentes variedades y colores. Una taza de verduras equivale a 1 taza (91 g) de brcoli o coliflor, 2 zanahorias medianas, 2 tazas (150 g) de verduras de [employer] crudas, 1 tomate grande, 1 pimiento morrn grande, 1 batata grande o 1 patata blanca mediana. Cereales Propngase comer el equivalente a entre 4 y 10 onzas de cereales integrales por Futures trader. Algunos ejemplos de equivalentes a 1 onza de cereales son 1 rebanada de pan, 1 taza (40 g) de cereal listo para comer, 3 tazas (24 g) de palomitas de maz o  taza (93 g) de arroz cocido. Carnes y otras protenas Propngase comer el equivalente a entre 5 y 7  onzas de protena por Futures trader. Algunos ejemplos de equivalentes a 1 onza de protenas incluyen 1 huevo,  oz de frutos secos (12 almendras, 24 pistachos o 7 mitades de nueces), 1/4 taza (90 g) de frijoles cocidos, 6 cucharadas (90 g) de hummus o 1 cucharada (16 g) de [place] de man. Un corte de carne o pescado del tamao de un mazo de cartas equivale aproximadamente a 3 a 4 onzas (85  g). De las protenas que consume cada semana, intente que al menos 8 onzas (227 g) sean frutos de mar. Esto equivale a unas 2 porciones por semana. Esto incluye salmn, trucha, arenque y anchoas. Lcteos [employer] a 3 tazas de lcteos descremados o con bajo contenido de Museum/gallery curator. Algunos ejemplos de equivalentes a 1 taza de lcteos son 1 taza (240 ml) de leche, 8 onzas (250 g) de yogur, 1 onzas (44 g) de queso natural o 1 taza (240 ml) de leche de soja fortificada. Grasas y aceites Propngase consumir alrededor de 5 cucharaditas (21 g) de grasas y Acupuncturist. Elija grasas monoinsaturadas, como el aceite de canola y de [place], la [place] con aceite de [place] o de [place], [place], [place] de man y Games developer de los frutos secos, o bien grasas poliinsaturadas, como el aceite de [place], maz y soja, nueces, piones, semillas de ssamo, semillas de girasol y semillas de lino. Bebidas Propngase beber 6 vasos de 8 onzas de Warehouse manager. Limite el caf a entre 3 y 5 tazas de ocho onzas por Futures trader. Limite el consumo de bebidas con cafena que tengan caloras agregadas, como los refrescos y las bebidas energizantes. Si bebe alcohol: Limite la cantidad que bebe a lo siguiente: De 0 a 1 medida al da si es [place]. De 0 a 2 medidas  al da si es varn. Sepa cunta cantidad de alcohol hay en las bebidas que toma. En los [address], una medida es una botella de cerveza de 12 oz (355 ml), un vaso de vino de 5 oz (148 ml) o un vaso de una bebida alcohlica de alta graduacin de 1 oz (44 ml). Condimentos y otros alimentos Trate de no agregar demasiada sal a los alimentos. Trate de usar hierbas y especias en lugar de sal. Trate de no agregar azcar a los alimentos. Esta informacin se basa en las pautas de nutricin de los [place]. Para obtener ms informacin, visite [email or web address]. Las Information systems manager. Es posible que necesite cantidades  diferentes. Esta informacin no tiene Theme park manager el consejo del mdico. Asegrese de hacerle al mdico cualquier pregunta que tenga. Document Revised: 07/22/2022 Document Reviewed: 07/22/2022 Elsevier Patient Education  2024 [employer].

## 2024-02-10 ENCOUNTER — Other Ambulatory Visit: Payer: Self-pay

## 2024-02-10 LAB — LIPID PANEL
Chol/HDL Ratio: 3.2 ratio (ref 0.0–4.4)
Cholesterol, Total: 145 mg/dL (ref 100–199)
HDL: 45 mg/dL (ref >39)
LDL Chol Calc (NIH): 76 mg/dL (ref 0–99)
Triglycerides: 139 mg/dL (ref 0–149)
VLDL Cholesterol Cal: 24 mg/dL (ref 5–40)

## 2024-02-10 LAB — COMPREHENSIVE METABOLIC PANEL WITH GFR
ALT: 21 IU/L (ref 0–32)
AST: 18 IU/L (ref 0–40)
Albumin: 4.4 g/dL (ref 3.9–4.9)
Alkaline Phosphatase: 112 IU/L (ref 44–121)
BUN/Creatinine Ratio: 16 (ref 9–23)
BUN: 9 mg/dL (ref 6–24)
Bilirubin Total: 0.5 mg/dL (ref 0.0–1.2)
CO2: 20 mmol/L (ref 20–29)
Calcium: 8.8 mg/dL (ref 8.7–10.2)
Chloride: 105 mmol/L (ref 96–106)
Creatinine, Ser: 0.58 mg/dL (ref 0.57–1.00)
Globulin, Total: 2.6 g/dL (ref 1.5–4.5)
Glucose: 109 mg/dL — ABNORMAL HIGH (ref 70–99)
Potassium: 3.9 mmol/L (ref 3.5–5.2)
Sodium: 139 mmol/L (ref 134–144)
Total Protein: 7 g/dL (ref 6.0–8.5)
eGFR: 110 mL/min/{1.73_m2} (ref >59)

## 2024-02-10 LAB — CBC
Hematocrit: 39.9 % (ref 34.0–46.6)
Hemoglobin: 12.8 g/dL (ref 11.1–15.9)
MCH: 27.4 pg (ref 26.6–33.0)
MCHC: 32.1 g/dL (ref 31.5–35.7)
MCV: 85 fL (ref 79–97)
Platelets: 284 10*3/uL (ref 150–450)
RBC: 4.67 x10E6/uL (ref 3.77–5.28)
RDW: 14 % (ref 11.7–15.4)
WBC: 7.3 10*3/uL (ref 3.4–10.8)

## 2024-02-17 ENCOUNTER — Ambulatory Visit: Payer: Self-pay

## 2024-02-17 NOTE — Telephone Encounter (Signed)
 FYI  Copied from CRM [id number]. Topic: Clinical - Lab/Test Results >> Feb 17, 2024 12:35 PM Ethelle Herb L wrote:  Reason for CRM: Patient given test results. No further questions.

## 2024-03-14 ENCOUNTER — Other Ambulatory Visit: Payer: Self-pay

## 2024-05-27 ENCOUNTER — Other Ambulatory Visit: Payer: Self-pay

## 2024-05-30 ENCOUNTER — Other Ambulatory Visit: Payer: Self-pay

## 2024-06-10 ENCOUNTER — Telehealth: Payer: Self-pay | Admitting: Internal Medicine

## 2024-06-10 NOTE — Telephone Encounter (Signed)
 Called patient to confirm upcoming appointment 06/13/2024, no answer. Unable to leave VM.

## 2024-06-13 ENCOUNTER — Ambulatory Visit: Payer: Self-pay | Admitting: Internal Medicine

## 2024-06-13 ENCOUNTER — Other Ambulatory Visit: Payer: Self-pay

## 2024-06-13 ENCOUNTER — Telehealth: Payer: Self-pay | Admitting: Internal Medicine

## 2024-06-13 NOTE — Telephone Encounter (Signed)
 Contacted pt left vm to resch appt Dr.Johnson not in the office

## 2024-07-21 ENCOUNTER — Other Ambulatory Visit: Payer: Self-pay

## 2024-07-21 ENCOUNTER — Encounter: Payer: Self-pay | Admitting: Internal Medicine

## 2024-07-21 ENCOUNTER — Ambulatory Visit: Payer: Self-pay | Attending: Internal Medicine | Admitting: Internal Medicine

## 2024-07-21 VITALS — BP 106/63 | HR 69 | Temp 97.8°F | Ht 62.0 in | Wt 183.0 lb

## 2024-07-21 DIAGNOSIS — Z1231 Encounter for screening mammogram for malignant neoplasm of breast: Secondary | ICD-10-CM

## 2024-07-21 DIAGNOSIS — E669 Obesity, unspecified: Secondary | ICD-10-CM

## 2024-07-21 DIAGNOSIS — E1169 Type 2 diabetes mellitus with other specified complication: Secondary | ICD-10-CM

## 2024-07-21 DIAGNOSIS — E785 Hyperlipidemia, unspecified: Secondary | ICD-10-CM

## 2024-07-21 DIAGNOSIS — Z23 Encounter for immunization: Secondary | ICD-10-CM

## 2024-07-21 DIAGNOSIS — Z79899 Other long term (current) drug therapy: Secondary | ICD-10-CM

## 2024-07-21 DIAGNOSIS — E119 Type 2 diabetes mellitus without complications: Secondary | ICD-10-CM

## 2024-07-21 DIAGNOSIS — R220 Localized swelling, mass and lump, head: Secondary | ICD-10-CM

## 2024-07-21 DIAGNOSIS — Z1211 Encounter for screening for malignant neoplasm of colon: Secondary | ICD-10-CM

## 2024-07-21 DIAGNOSIS — F411 Generalized anxiety disorder: Secondary | ICD-10-CM

## 2024-07-21 DIAGNOSIS — F331 Major depressive disorder, recurrent, moderate: Secondary | ICD-10-CM

## 2024-07-21 DIAGNOSIS — Z7984 Long term (current) use of oral hypoglycemic drugs: Secondary | ICD-10-CM

## 2024-07-21 LAB — POCT GLYCOSYLATED HEMOGLOBIN (HGB A1C): HbA1c, POC (controlled diabetic range): 6.5 % (ref 0.0–7.0)

## 2024-07-21 LAB — GLUCOSE, POCT (MANUAL RESULT ENTRY): POC Glucose: 136 mg/dL — AB (ref 70–99)

## 2024-07-21 MED ORDER — ROSUVASTATIN CALCIUM 5 MG PO TABS
5.0000 mg | ORAL_TABLET | Freq: Every day | ORAL | 1 refills | Status: AC
  Filled 2024-07-21 – 2024-09-12 (×2): qty 90, 90d supply, fill #0

## 2024-07-21 MED ORDER — METFORMIN HCL 500 MG PO TABS
500.0000 mg | ORAL_TABLET | Freq: Two times a day (BID) | ORAL | 1 refills | Status: AC
  Filled 2024-07-21: qty 180, 90d supply, fill #0

## 2024-07-21 MED ORDER — SERTRALINE HCL 100 MG PO TABS
100.0000 mg | ORAL_TABLET | Freq: Every day | ORAL | 5 refills | Status: AC
  Filled 2024-07-21 – 2024-08-19 (×2): qty 30, 30d supply, fill #0
  Filled 2024-09-12: qty 30, 30d supply, fill #1
  Filled 2024-10-20: qty 30, 30d supply, fill #2

## 2024-07-21 NOTE — Progress Notes (Signed)
 Patient ID: Theresa Barry, female    DOB: April 16, 1976  MRN: [id number]  CC: Diabetes (DM f/u. Cathi on scalp - discomfort Janiece to flu vax.Yes to mammogram)   Subjective: Theresa Barry is a 48 y.o. female who presents for chronic ds management. Her concerns today include:  Pt with hx of DM,  HL, H.pylori, polyarthralgia, anxiety   [employer] Language interpreter used during this encounter. #[id number] Cesar Leonel  [employer] Language interpreter used during this encounter. #[id number] [place]   Mass on scalp- Patient has a tender mass on the crown of the head, stable in size for approximately 7 months. Denies history of trauma or other precipitating factors. Pain is intermittent, described as inflammatory, and radiates to the left side of the head, neck, and jaw. Neck and face discomfort have  been present for 4 months, and the jaw pain began 4 days ago. The pain is aggravated by lying down and stress. Denies itching, enlargement of the mass, or hair loss. Tylenol  provides some relief.  MDD/anxiety- Reports depression for over 2 years, related to stress over her teenage son's drug use. Feels increasingly overwhelmed and states she has recently thought that things would be better off if she were not here, but denies any plan or intent to harm herself. No guns in the home. Previously saw behavioral health but stopped due to discomfort, but is now open to re engaging in therapy. Continues Zoloft  100 mg daily, reporting good anxiety control. Takes Vistaril  25 mg 2-3 times per week for sleep and anxiety but continues to have difficulty sleeping through the night.  Hyperlipidemia- Continues rosuvastatin  5 mg daily.  T2DM- A1c today 6.5, down from 6.6 in May. Continues metformin  500 mg BID. Reports avoiding sugary drinks, junk food, and processed foods. Walks 30 minutes 2-3 times per week for exercise. Has not yet scheduled her diabetic eye exam.  Results for orders placed or performed in visit on  07/21/24  POCT glucose (manual entry)   Collection Time: 07/21/24  4:23 PM  Result Value Ref Range   POC Glucose 136 (A) 70 - 99 mg/dl  POCT glycosylated hemoglobin (Hb A1C)   Collection Time: 07/21/24  4:25 PM  Result Value Ref Range   Hemoglobin A1C     HbA1c POC (<> result, manual entry)     HbA1c, POC (prediabetic range)     HbA1c, POC (controlled diabetic range) 6.5 0.0 - 7.0 %    HM FIT test due  Mammogram due, she has tried calling for a mammogram scholarship but has not reached anyone yet. Needs second Zoster Vaccine Wants Influenza Vaccine  Patient Active Problem List   Diagnosis Date Noted   Moderate major depression (HCC) 09/09/2023   GAD (generalized anxiety disorder) 11/21/2021   Positive depression screening 11/21/2021   Hyperlipidemia associated with type 2 diabetes mellitus (HCC) 06/05/2021   Abnormal LFTs 06/05/2021   Breast pain, left 03/31/2019   Screening breast examination 12/18/2017   Chronic radicular pain of lower back 05/19/2017   Prolapsing fibroid of cervix 05/16/2013   Lipoma 08/16/2007     Current Outpatient Medications on File Prior to Visit  Medication Sig Dispense Refill   metFORMIN  (GLUCOPHAGE ) 500 MG tablet TAKE 1 TABLET (500 MG TOTAL) BY MOUTH 2 (TWO) TIMES DAILY WITH A MEAL. 180 tablet 3   Multiple Vitamin (MULTIVITAMIN) capsule Take 1 capsule by mouth daily.     rosuvastatin  (CRESTOR ) 5 MG tablet Take 1 tablet (5 mg total) by mouth daily. Stop Atorvastatin  90  tablet 1   sertraline  (ZOLOFT ) 100 MG tablet Take 1 tablet (100 mg total) by mouth daily. 30 tablet 5   TRUEplus Lancets 28G MISC Use as directed 100 each 4   hydrOXYzine  (VISTARIL ) 25 MG capsule Take 1 capsule (25 mg total) by mouth at bedtime as needed for anxiety. (Patient not taking: Reported on 07/21/2024) 30 capsule 1   No current facility-administered medications on file prior to visit.    Allergies  Allergen Reactions   Lipitor [Atorvastatin ] Other (See Comments)     nausea    Social History   Socioeconomic History   Marital status: Married    Spouse name: Not on file   Number of children: 3   Years of education: Not on file   Highest education level: High school graduate  Occupational History   Not on file  Tobacco Use   Smoking status: Never   Smokeless tobacco: Never  Vaping Use   Vaping status: Never Used  Substance and Sexual Activity   Alcohol use: No   Drug use: No   Sexual activity: Yes    Birth control/protection: Surgical  Other Topics Concern   Not on file  Social History Narrative   Not on file   Social Drivers of Health   Financial Resource Strain: Not on file  Food Insecurity: No Food Insecurity (08/14/2023)   Hunger Vital Sign    Worried About Running Out of Food in the Last Year: Never true    Ran Out of Food in the Last Year: Never true  Transportation Needs: No Transportation Needs (03/20/2022)   PRAPARE - Administrator, Civil Service (Medical): No    Lack of Transportation (Non-Medical): No  Physical Activity: Not on file  Stress: Not on file  Social Connections: Not on file  Intimate Partner Violence: Not on file    Family History  Problem Relation Age of Onset   Diabetes Mother    Cancer Mother    Ovarian cancer Mother    Breast cancer Maternal Aunt    Breast cancer Paternal Aunt    Breast cancer Paternal Aunt    Diabetes Brother    Cancer Brother     Past Surgical History:  Procedure Laterality Date   ABDOMINAL HYSTERECTOMY     BILATERAL SALPINGECTOMY Bilateral 12/18/2015   Procedure: BILATERAL SALPINGECTOMY;  Surgeon: Lynwood KANDICE Solomons, MD;  Location: [facility];  Service: Gynecology;  Laterality: Bilateral;   fibroidectomy     vaginally   HYSTEROSCOPY WITH D & C N/A 05/16/2013   Procedure: DILATATION AND CURETTAGE /HYSTEROSCOPY  Removal of ALROY FIBROID;  Surgeon: Gloris DELENA Hugger, MD;  Location: [facility];  Service: Gynecology;  Laterality: N/A;   OVARIAN CYST REMOVAL     VAGINAL  HYSTERECTOMY N/A 12/18/2015   Procedure: HYSTERECTOMY VAGINAL;  Surgeon: Lynwood KANDICE Solomons, MD;  Location: [facility];  Service: Gynecology;  Laterality: N/A;    ROS: Review of Systems Negative except as stated above  PHYSICAL EXAM: BP 106/63 (BP Location: Left Arm, Patient Position: Sitting, Cuff Size: Normal)   Pulse 69   Temp 97.8 F (36.6 C) (Oral)   Ht 5' 2 (1.575 m)   Wt 83 kg   LMP  (LMP Unknown)   SpO2 98%   BMI 33.47 kg/m   Physical Exam  General appearance - alert, pleasant, obese Hispanic female who became teary when discussing her stressors. Mental status - normal mood, behavior, speech and dress. Was fidgeting with a hair tie on  her wrist throughout the visit and reports recent passive suicidal thoughts. Head- firm mass about the size of the pea at the crown of the head without erythema or swelling Chest - clear to auscultation, no wheezes, rales or rhonchi, symmetric air entry Heart - normal rate, regular rhythm, normal S1, S2, no murmurs, rubs, clicks or gallops Extremities - no pedal edema noted      Latest Ref Rng & Units 02/09/2024    9:18 AM 02/09/2023    4:52 PM 10/10/2022    4:10 PM  CMP  Glucose 70 - 99 mg/dL 890   83   BUN 6 - 24 mg/dL 9   9   Creatinine 9.42 - 1.00 mg/dL 9.41   9.42   Sodium 865 - 144 mmol/L 139   139   Potassium 3.5 - 5.2 mmol/L 3.9   4.1   Chloride 96 - 106 mmol/L 105   104   CO2 20 - 29 mmol/L 20   23   Calcium  8.7 - 10.2 mg/dL 8.8   8.9   Total Protein 6.0 - 8.5 g/dL 7.0  7.1  7.1   Total Bilirubin 0.0 - 1.2 mg/dL 0.5  0.3  0.3   Alkaline Phos 44 - 121 IU/L 112  112  108   AST 0 - 40 IU/L 18  25  21    ALT 0 - 32 IU/L 21  24  23     Lipid Panel     Component Value Date/Time   CHOL 145 02/09/2024 0918   TRIG 139 02/09/2024 0918   HDL 45 02/09/2024 0918   CHOLHDL 3.2 02/09/2024 0918   CHOLHDL 3.4 Ratio 12/01/2007 2012   VLDL 32 12/01/2007 2012   LDLCALC 76 02/09/2024 0918    CBC    Component Value Date/Time   WBC 7.3  02/09/2024 0918   WBC 15.2 (H) 12/19/2015 0553   RBC 4.67 02/09/2024 0918   RBC 4.03 12/19/2015 0553   HGB 12.8 02/09/2024 0918   HCT 39.9 02/09/2024 0918   PLT 284 02/09/2024 0918   MCV 85 02/09/2024 0918   MCH 27.4 02/09/2024 0918   MCH 25.8 (L) 12/19/2015 0553   MCHC 32.1 02/09/2024 0918   MCHC 33.4 12/19/2015 0553   RDW 14.0 02/09/2024 0918   LYMPHSABS 2.1 06/24/2018 1522   MONOABS 0.8 08/12/2015 1850   EOSABS 0.1 06/24/2018 1522   BASOSABS 0.0 06/24/2018 1522       07/21/2024    4:19 PM 02/09/2024    8:47 AM 08/26/2023    4:23 PM 08/14/2023    4:39 PM 02/09/2023    4:13 PM  Depression screen PHQ 2/9  Decreased Interest 1 0 1 2 2   Down, Depressed, Hopeless 1 0 1 2 2   PHQ - 2 Score 2 0 2 4 4   Altered sleeping 2 0 1 2 2   Tired, decreased energy 2 2 2 2 2   Change in appetite 2 0 1 2 2   Feeling bad or failure about yourself  2 2 2 2 2   Trouble concentrating 2 2 2 2 2   Moving slowly or fidgety/restless 2 1 0 2 2  Suicidal thoughts 2 1 0 1 2  PHQ-9 Score 16 8 10 17 18   Difficult doing work/chores Somewhat difficult Not difficult at all         07/21/2024    4:19 PM 02/09/2024    8:49 AM 08/26/2023    4:23 PM 08/14/2023    4:39 PM  GAD 7 :  Generalized Anxiety Score  Nervous, Anxious, on Edge 1 2 1 1   Control/stop worrying 2 1 1 1   Worry too much - different things 2 2 1 2   Trouble relaxing 2 2  2   Restless 2 2 1 2   Easily annoyed or irritable 2 2 2 2   Afraid - awful might happen 2 2 3 2   Total GAD 7 Score 13 13  12   Anxiety Difficulty Somewhat difficult Not difficult at all      ASSESSMENT AND PLAN:  Assessment and Plan  1. Type 2 diabetes mellitus in patient with obesity (HCC) (Primary) At goal. Discussed and encouraged to continue healthy eating habits and exercise.  Told her she can get her diabetic eye exam at [facility] and to let them know she has diabetes. Continue metformin  500mg  BID. - metFORMIN  (GLUCOPHAGE ) 500 MG tablet; TAKE 1 TABLET (500 MG  TOTAL) BY MOUTH 2 (TWO) TIMES DAILY WITH A MEAL.  Dispense: 180 tablet; Refill: 1 - Microalbumin / creatinine, urine ratio  2. Diabetes mellitus treated with oral medication (HCC) #1 as above.  - POCT glucose (manual entry) - POCT glycosylated hemoglobin (Hb A1C)  3. Major depressive disorder, recurrent episode, moderate (HCC) Pt reports passive SI and worsening depression--her PHQ-9 score has doubled since May, is now 87. She denies any intent or plan to hurt herself. She agrees to [facility] referral, but does not want to increase her Zoloft  dosage. Advised her to go to the ED immediately if she develops active thoughts or plans of self harm. - Continue Zoloft  100 mg daily - Ambulatory referral to Psychiatry  4. GAD (generalized anxiety disorder) Continue on Zoloft  as in #2 above. GAD-7 remains stable at 13 since May.  - sertraline  (ZOLOFT ) 100 MG tablet; Take 1 tablet (100 mg total) by mouth daily.  Dispense: 30 tablet; Refill: 5 - Ambulatory referral to Psychiatry  5. Mass of scalp Mostly likely a cyst or bony overgrowth. If it begins to increase in size we may get imaging and send dermatology referral.   6. Hyperlipidemia associated with type 2 diabetes mellitus (HCC) Continue rosuvastatin . - rosuvastatin  (CRESTOR ) 5 MG tablet; Take 1 tablet (5 mg total) by mouth daily. Stop Atorvastatin   Dispense: 90 tablet; Refill: 1  7. Need for immunization against influenza - Flu vaccine trivalent PF, 6mos and older(Flulaval ,Afluria,Fluarix,Fluzone)  8. Need for shingles vaccine Provided prescription for the 2nd Zoster vaccine and told her she can receive it at the downstairs pharmacy. - ZOSTER/SHINGLES VACCINE/TRANSACTRX ONLY  9. Screening for colon cancer Gave the patient a FIT kit for colon cancer screening. Told her to use it and bring it to the lab the same day she completes it.  - Fecal occult blood, imunochemical([facility])  10. Screening mammogram for breast cancer Overdue. Had  her sign the mammogram scholarship paperwork since she is uninsured.  - MM 3D SCREENING MAMMOGRAM BILATERAL BREAST; Future  Patient was given the opportunity to ask questions.  Patient verbalized understanding of the plan and was able to repeat key elements of the plan.    Orders Placed This Encounter  Procedures   Flu vaccine trivalent PF, 6mos and older(Flulaval ,Afluria,Fluarix,Fluzone)   POCT glucose (manual entry)   POCT glycosylated hemoglobin (Hb A1C)     Requested Prescriptions    No prescriptions requested or ordered in this encounter    No follow-ups on file.  This is a Psychologist, occupational Note.  The care of the patient was discussed with Dr. Vicci and the assessment  and plan formulated with her assistance.  Please see her attestation of this encounter.  Duwaine Amber, MS3 [employer]   Evaluation and management procedures were performed by me with Medical Student in attendance, note written by Medical Student under my supervision and collaboration. I have reviewed the note and I agree with the management and plan.   Barnie Louder, MD, FAAFP. [facility] [place], [place] [phone]   07/21/2024, 11:04 PM

## 2024-07-21 NOTE — Patient Instructions (Addendum)
 You can go to [facility] to get your diabetic eye exam at a low cost. Please let them know you have diabetes and need them to look in the back of your eye.   Please look out for a call to schedule your mammogram.     We have sent a referral to behavorial health. If you have thoughts or plans of wanting to hurt yourself please go to the Emergency room for help.   If the mass on your head starts increasing in size we may need to image it and sent to dermatology.  We have given you a test to screen for colon cancer.  Bring it to the lab the same day you complete it.

## 2024-07-23 ENCOUNTER — Ambulatory Visit: Payer: Self-pay | Admitting: Internal Medicine

## 2024-07-23 LAB — MICROALBUMIN / CREATININE URINE RATIO
Creatinine, Urine: 26.1 mg/dL
Microalb/Creat Ratio: <11 mg/g{creat} (ref 0–29)
Microalbumin, Urine: <3 ug/mL

## 2024-07-26 ENCOUNTER — Ambulatory Visit: Payer: Self-pay

## 2024-07-26 NOTE — Telephone Encounter (Signed)
 Copied from CRM #[id number]. Topic: Clinical - Lab/Test Results >> Jul 26, 2024 10:38 AM Donna BRAVO wrote: Reason for CRM: patient returning call regarding appt on 07/21/24  Alarcon, Clarisa N, [facility]    07/26/24 10:31 AM Result Note Called but no answer. LVM to call back.  CAL stated to call Nurse Triage Answer Assessment - Initial Assessment Questions 1. REASON FOR CALL: What is the main reason for your call? or How can I best help you?     Patient relayed results of her POC glucose and that there were no abnormal amounts of protein seen in her urine. Patient with no further questions or concerns at this time  Protocols used: Information Only Call - No Triage-A-AH

## 2024-08-11 ENCOUNTER — Telehealth: Payer: Self-pay

## 2024-08-11 NOTE — Telephone Encounter (Signed)
 Telephoned patient at mobile number using interpreter#[id number]. Left a voice message with [facility] (scholarship) contact information.

## 2024-08-19 ENCOUNTER — Other Ambulatory Visit: Payer: Self-pay

## 2024-08-19 ENCOUNTER — Telehealth: Payer: Self-pay | Admitting: Internal Medicine

## 2024-08-24 ENCOUNTER — Telehealth: Payer: Self-pay | Admitting: Internal Medicine

## 2024-09-12 ENCOUNTER — Other Ambulatory Visit: Payer: Self-pay

## 2024-09-13 ENCOUNTER — Other Ambulatory Visit: Payer: Self-pay

## 2024-09-20 ENCOUNTER — Ambulatory Visit: Admission: RE | Admit: 2024-09-20 | Payer: Self-pay | Source: Ambulatory Visit

## 2024-09-20 DIAGNOSIS — Z1231 Encounter for screening mammogram for malignant neoplasm of breast: Secondary | ICD-10-CM

## 2024-09-27 ENCOUNTER — Encounter (HOSPITAL_COMMUNITY): Payer: Self-pay

## 2024-09-27 ENCOUNTER — Ambulatory Visit (HOSPITAL_COMMUNITY)

## 2024-10-20 ENCOUNTER — Other Ambulatory Visit: Payer: Self-pay

## 2024-10-20 ENCOUNTER — Ambulatory Visit (HOSPITAL_COMMUNITY)

## 2024-11-21 ENCOUNTER — Ambulatory Visit (HOSPITAL_COMMUNITY)

## 2024-11-22 ENCOUNTER — Ambulatory Visit: Payer: Self-pay | Admitting: Internal Medicine
